# Patient Record
Sex: Male | Born: 1960 | Race: Black or African American | Hispanic: No | Marital: Single | Smoking: Current every day smoker
Health system: Southern US, Community
[De-identification: ages and names within clinical notes are randomized; demographics above are authoritative.]

## PROBLEM LIST (undated history)

## (undated) DIAGNOSIS — L409 Psoriasis, unspecified: Secondary | ICD-10-CM

## (undated) DIAGNOSIS — I10 Essential (primary) hypertension: Secondary | ICD-10-CM

## (undated) DIAGNOSIS — J45909 Unspecified asthma, uncomplicated: Secondary | ICD-10-CM

## (undated) DIAGNOSIS — M1990 Unspecified osteoarthritis, unspecified site: Secondary | ICD-10-CM

## (undated) DIAGNOSIS — M5416 Radiculopathy, lumbar region: Secondary | ICD-10-CM

## (undated) HISTORY — DX: Unspecified osteoarthritis, unspecified site: M19.90

## (undated) HISTORY — DX: Essential (primary) hypertension: I10

## (undated) HISTORY — DX: Radiculopathy, lumbar region: M54.16

## (undated) HISTORY — DX: Psoriasis, unspecified: L40.9

---

## 2007-12-25 DIAGNOSIS — L409 Psoriasis, unspecified: Secondary | ICD-10-CM

## 2007-12-25 DIAGNOSIS — M1990 Unspecified osteoarthritis, unspecified site: Secondary | ICD-10-CM

## 2007-12-25 HISTORY — DX: Unspecified osteoarthritis, unspecified site: M19.90

## 2007-12-25 HISTORY — DX: Psoriasis, unspecified: L40.9

## 2010-11-30 ENCOUNTER — Emergency Department (HOSPITAL_COMMUNITY): Admission: EM | Admit: 2010-11-30 | Discharge: 2010-07-30 | Payer: Self-pay | Admitting: Emergency Medicine

## 2012-12-24 DIAGNOSIS — I10 Essential (primary) hypertension: Secondary | ICD-10-CM

## 2012-12-24 HISTORY — DX: Essential (primary) hypertension: I10

## 2017-12-24 DIAGNOSIS — M5416 Radiculopathy, lumbar region: Secondary | ICD-10-CM

## 2017-12-24 HISTORY — DX: Radiculopathy, lumbar region: M54.16

## 2018-10-31 ENCOUNTER — Ambulatory Visit: Payer: Self-pay | Admitting: Internal Medicine

## 2018-10-31 ENCOUNTER — Encounter: Payer: Self-pay | Admitting: Internal Medicine

## 2018-10-31 VITALS — BP 152/102 | HR 68 | Resp 12 | Ht 69.0 in | Wt 155.0 lb

## 2018-10-31 DIAGNOSIS — M1990 Unspecified osteoarthritis, unspecified site: Secondary | ICD-10-CM

## 2018-10-31 DIAGNOSIS — I10 Essential (primary) hypertension: Secondary | ICD-10-CM

## 2018-10-31 DIAGNOSIS — J3089 Other allergic rhinitis: Secondary | ICD-10-CM

## 2018-10-31 DIAGNOSIS — L409 Psoriasis, unspecified: Secondary | ICD-10-CM

## 2018-10-31 DIAGNOSIS — M5416 Radiculopathy, lumbar region: Secondary | ICD-10-CM

## 2018-10-31 MED ORDER — MELOXICAM 15 MG PO TABS: 15.0000 mg | ORAL_TABLET | Freq: Every day | ORAL | 4 refills | Status: DC

## 2018-10-31 MED ORDER — AMLODIPINE BESYLATE 10 MG PO TABS: 10.0000 mg | ORAL_TABLET | Freq: Every day | ORAL | 11 refills | Status: DC

## 2018-10-31 MED ORDER — MOMETASONE FUROATE 50 MCG/ACT NA SUSP
NASAL | 12 refills | Status: DC
Start: 2018-10-31 — End: 2021-11-29

## 2018-10-31 MED ORDER — CETIRIZINE HCL 10 MG PO TABS: 10.0000 mg | ORAL_TABLET | Freq: Every day | ORAL | 11 refills | Status: DC

## 2018-10-31 MED ORDER — GABAPENTIN 100 MG PO CAPS
ORAL_CAPSULE | ORAL | 3 refills | Status: DC
Start: 2018-10-31 — End: 2021-10-22
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
## 2018-10-31 NOTE — Patient Instructions (Signed)
Gabapentin: Start taking Gabapentin 100 mg cap 1 cap at bedtime. In 3 days, increase to 2 caps at bedtime. In another 3 days, increase to 3 caps at bedtime You should be taking 3 caps at bedtime at this point.  In 3 days, start another 1 cap in the morning In 3 more days, increase to 2 caps in the morning In 3 more days, increase to 3 caps in the morning:  You should be taking 3 caps in the morning and 3 caps at bedtime at this point.  In 3 days, start another dose midday--1 cap In 3 more days, increase to 2 caps midday In 3 more days, increase to 3 caps midday. You should be taking 3 caps 3 times daily at this point.  Stay on this dose until follow up If you do not tolerate increasing the dose at any point, hold on the dose you tolerate or call clinic if having problems 

## 2018-11-07 ENCOUNTER — Other Ambulatory Visit (INDEPENDENT_AMBULATORY_CARE_PROVIDER_SITE_OTHER): Payer: Self-pay

## 2018-11-07 VITALS — BP 142/90 | HR 70

## 2018-11-07 DIAGNOSIS — I10 Essential (primary) hypertension: Secondary | ICD-10-CM

## 2018-11-07 DIAGNOSIS — M1990 Unspecified osteoarthritis, unspecified site: Secondary | ICD-10-CM

## 2018-11-07 DIAGNOSIS — Z79899 Other long term (current) drug therapy: Secondary | ICD-10-CM

## 2018-11-07 DIAGNOSIS — Z13220 Encounter for screening for lipoid disorders: Secondary | ICD-10-CM

## 2018-11-07 NOTE — Progress Notes (Signed)
Patient BP still elevated but better than before. Patient did not take medication this morning due to him having to fast. Per Dr. Delrae AlfredMulberry have patient come back in 2 weeks for repeat BP check and make sure he takes his medication that morning. Patient verbalized understanding

## 2018-11-08 LAB — CBC WITH DIFFERENTIAL/PLATELET
Basophils Absolute: 0.1 10*3/uL (ref 0.0–0.2)
Basos: 1 %
EOS (ABSOLUTE): 0.1 10*3/uL (ref 0.0–0.4)
EOS: 2 %
HEMATOCRIT: 41.5 % (ref 37.5–51.0)
Hemoglobin: 13.9 g/dL (ref 13.0–17.7)
IMMATURE GRANULOCYTES: 1 %
Immature Grans (Abs): 0 10*3/uL (ref 0.0–0.1)
LYMPHS ABS: 1.8 10*3/uL (ref 0.7–3.1)
Lymphs: 25 %
MCH: 30.5 pg (ref 26.6–33.0)
MCHC: 33.5 g/dL (ref 31.5–35.7)
MCV: 91 fL (ref 79–97)
MONOS ABS: 0.7 10*3/uL (ref 0.1–0.9)
Monocytes: 10 %
NEUTROS PCT: 61 %
Neutrophils Absolute: 4.6 10*3/uL (ref 1.4–7.0)
PLATELETS: 383 10*3/uL (ref 150–450)
RBC: 4.55 x10E6/uL (ref 4.14–5.80)
RDW: 13 % (ref 12.3–15.4)
WBC: 7.3 10*3/uL (ref 3.4–10.8)

## 2018-11-08 LAB — COMPREHENSIVE METABOLIC PANEL
A/G RATIO: 1.9 (ref 1.2–2.2)
ALBUMIN: 4.3 g/dL (ref 3.5–5.5)
ALK PHOS: 65 IU/L (ref 39–117)
ALT: 34 IU/L (ref 0–44)
AST: 28 IU/L (ref 0–40)
BUN / CREAT RATIO: 20 (ref 9–20)
BUN: 22 mg/dL (ref 6–24)
CO2: 25 mmol/L (ref 20–29)
Calcium: 9.4 mg/dL (ref 8.7–10.2)
Chloride: 104 mmol/L (ref 96–106)
Creatinine, Ser: 1.08 mg/dL (ref 0.76–1.27)
GFR calc Af Amer: 87 mL/min/{1.73_m2} (ref >59)
GFR calc non Af Amer: 75 mL/min/{1.73_m2} (ref >59)
GLOBULIN, TOTAL: 2.3 g/dL (ref 1.5–4.5)
GLUCOSE: 78 mg/dL (ref 65–99)
POTASSIUM: 5.3 mmol/L — AB (ref 3.5–5.2)
SODIUM: 143 mmol/L (ref 134–144)
Total Protein: 6.6 g/dL (ref 6.0–8.5)

## 2018-11-08 LAB — LIPID PANEL W/O CHOL/HDL RATIO
Cholesterol, Total: 174 mg/dL (ref 100–199)
HDL: 91 mg/dL (ref >39)
LDL Calculated: 73 mg/dL (ref 0–99)
Triglycerides: 49 mg/dL (ref 0–149)
VLDL CHOLESTEROL CAL: 10 mg/dL (ref 5–40)

## 2018-11-08 LAB — SEDIMENTATION RATE: Sed Rate: 5 mm/hr (ref 0–30)

## 2019-02-02 ENCOUNTER — Ambulatory Visit: Payer: Self-pay | Admitting: Internal Medicine

## 2019-03-06 ENCOUNTER — Ambulatory Visit: Payer: Self-pay | Admitting: Internal Medicine

## 2019-03-27 ENCOUNTER — Ambulatory Visit: Payer: Self-pay | Admitting: Internal Medicine

## 2019-05-13 ENCOUNTER — Ambulatory Visit: Payer: Self-pay | Admitting: Internal Medicine

## 2019-05-20 ENCOUNTER — Encounter: Payer: Self-pay | Admitting: Internal Medicine

## 2019-05-22 ENCOUNTER — Ambulatory Visit: Payer: Self-pay | Admitting: Internal Medicine

## 2019-07-13 ENCOUNTER — Ambulatory Visit: Payer: Self-pay | Admitting: Internal Medicine

## 2019-07-14 ENCOUNTER — Ambulatory Visit: Payer: Self-pay | Admitting: Internal Medicine

## 2019-10-27 ENCOUNTER — Ambulatory Visit (INDEPENDENT_AMBULATORY_CARE_PROVIDER_SITE_OTHER): Payer: Medicaid Other

## 2019-10-27 ENCOUNTER — Ambulatory Visit (INDEPENDENT_AMBULATORY_CARE_PROVIDER_SITE_OTHER): Payer: Medicaid Other | Admitting: Orthopedic Surgery

## 2019-10-27 ENCOUNTER — Encounter: Payer: Self-pay | Admitting: Orthopedic Surgery

## 2019-10-27 VITALS — Ht 69.0 in | Wt 155.0 lb

## 2019-10-27 DIAGNOSIS — G8929 Other chronic pain: Secondary | ICD-10-CM

## 2019-10-27 DIAGNOSIS — M5441 Lumbago with sciatica, right side: Secondary | ICD-10-CM

## 2019-10-27 MED ORDER — PREDNISONE 10 MG PO TABS: 10.0000 mg | ORAL_TABLET | Freq: Every day | ORAL | 1 refills | Status: DC
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
## 2019-11-10 ENCOUNTER — Encounter: Payer: Self-pay | Admitting: Orthopedic Surgery

## 2019-11-10 ENCOUNTER — Ambulatory Visit (INDEPENDENT_AMBULATORY_CARE_PROVIDER_SITE_OTHER): Payer: Medicaid Other | Admitting: Orthopedic Surgery

## 2019-11-10 VITALS — Ht 69.0 in | Wt 155.0 lb

## 2019-11-10 DIAGNOSIS — M5416 Radiculopathy, lumbar region: Secondary | ICD-10-CM
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
## 2019-11-25 ENCOUNTER — Telehealth: Payer: Self-pay
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
## 2019-12-02 ENCOUNTER — Telehealth: Payer: Self-pay | Admitting: Radiology

## 2019-12-02 DIAGNOSIS — M5416 Radiculopathy, lumbar region: Secondary | ICD-10-CM

## 2019-12-02 NOTE — Telephone Encounter (Signed)
FYI  Patient walked in stating he is supposed to have MRI and injection with Dr. Ernestina Patches and has not heard anything.  Per notes in chart, injection is to be scheduled post MRI.  MRI has been denied by insurance, and Randy Collins recommended 6 weeks of PT and then resubmit.  Patient would like to proceed with PT here.  I have entered referral. Patient aware someone from PT will call to schedule.

## 2019-12-02 NOTE — Telephone Encounter (Signed)
Thank you, noted.

## 2020-01-05 ENCOUNTER — Encounter: Payer: Self-pay | Admitting: Physical Therapy

## 2020-01-05 ENCOUNTER — Ambulatory Visit: Payer: Medicaid Other | Attending: Physician Assistant | Admitting: Physical Therapy

## 2020-01-05 ENCOUNTER — Other Ambulatory Visit: Payer: Self-pay

## 2020-01-05 DIAGNOSIS — M5416 Radiculopathy, lumbar region: Secondary | ICD-10-CM | POA: Diagnosis not present

## 2020-01-05 NOTE — Patient Instructions (Signed)
Access Code: [email or web address]  URL: [email or web address]  Date: 01/05/2020  Prepared by: Karie Mainland   Exercises  Prone Press Up on Elbows - 10 reps - 2 sets - 10 hold - 2x daily - 7x weekly  Supine Piriformis Stretch with Foot on Ground - 3 reps - 1 sets - 30 hold - 2x daily - 7x weekly  Supine Lower Trunk Rotation - 10 reps - 2 sets - 10 hold - 2x daily - 7x weekly  Supine Hamstring Stretch with Strap - 5 reps - 1 sets - 30 hold - 2x daily - 7x weekly  Sidelying Hip Extension PROM with Caregiver - 3 reps - 1 sets - 30 hold - 1x daily - 7x weekly

## 2020-01-05 NOTE — Therapy (Signed)
[place], [place], [address] Phone: [phone]   Fax:  [phone]  Physical Therapy Evaluation  Patient Details  Name: Randy Collins MRN: [id number] Date of Birth: 29-Oct-1961 Referring Provider (PT): Dr. Meridee Score   Encounter Date: 01/05/2020  PT End of Session - 01/05/20 1534    Visit Number  1    Number of Visits  15    Date for PT Re-Evaluation  03/01/20    Authorization Type  MCD    PT Start Time  1045    PT Stop Time  1130    PT Time Calculation (min)  45 min    Activity Tolerance  Patient tolerated treatment well    Behavior During Therapy  [facility] for tasks assessed/performed       Past Medical History:  Diagnosis Date  . Arthritis 2009   likely psoriatic arthritis--inflammatory, but not symmetric, left hip and back, right elbow, both knees  . Hypertension 2014  . Psoriasis 2009  . Right lumbar radiculopathy 2019    History reviewed. No pertinent surgical history.  There were no vitals filed for this visit.   Subjective Assessment - 01/05/20 1054    Subjective  Pt has had chronic pain in back and Rt leg for > 1 yr. He did chiropractic care without benefit.  Some days he needs to use a walker to get up. He is planning to have spinal injections and would consider surgery if needed. It may take 20-40 min to get moving due to pain in AM,    Limitations  Lifting;Standing;Walking;House hold activities    Diagnostic tests  XR, MRI ordered    Currently in Pain?  Yes    Pain Score  8     Pain Location  Back    Pain Orientation  Right;Left;Lower    Pain Descriptors / Indicators  Tightness;Aching    Pain Type  Chronic pain    Pain Radiating Towards  Rt leg    Pain Onset  More than a month ago    Pain Frequency  Constant    Aggravating Factors   the more I do the more I hurt, walking    Pain Relieving Factors  sitting relieves it    Effect of Pain on Daily Activities  pain prevents him from  bending and picking up things    Multiple Pain Sites  No         [facility] PT Assessment - 01/05/20 0001      Assessment   Medical Diagnosis  Lumbar radiculopathy    Referring Provider (PT)  Dr. Meridee Score    Prior Therapy  No, did chiropractic       Precautions   Precautions  None      Restrictions   Weight Bearing Restrictions  No      Balance Screen   Has the patient fallen in the past 6 months  No      [place] residence    Living Arrangements  Non-relatives/Friends    Type of [place] to enter    Entrance Stairs-Number of Steps  5    Entrance Stairs-Rails  Can reach both    [place] - 2 wheels;[place] - single point      Prior Function   Vocation  Full time employment   recently quit    Scientist, clinical (histocompatibility and immunogenetics) at  [employer]     Leisure  likes to cycle       Cognition   Overall Cognitive Status  Within Functional Limits for tasks assessed      Observation/Other Assessments   Focus on Therapeutic Outcomes (FOTO)   NT MCD       Sensation   Light Touch  Appears Intact      Coordination   Gross Motor Movements are Fluid and Coordinated  Not tested      Posture/Postural Control   Posture/Postural Control  Postural limitations    Postural Limitations  Rounded Shoulders;Forward head      AROM   Lumbar Flexion  25%   pain    Lumbar Extension  25%   no pain    Lumbar - Right Side Bend  stiff   pain on R    Lumbar - Left Side Bend  stiff   pain on R    Lumbar - Right Rotation  25%    Lumbar - Left Rotation  25%      PROM   Overall PROM Comments  no pain with hip ROM       Strength   Strength Assessment Site  --   knees and ankles WFL 5/5    Right Hip Flexion  4-/5    Right Hip Extension  4/5    Right Hip ABduction  4/5    Left Hip Flexion  4/5    Left Hip Extension  4/5    Left Hip ABduction  4/5      Palpation   Palpation comment  pain L4-L5 , central and to R of spinous  process, pain in lumbar paraspinals on Rt side       Special Tests    Special Tests  Lumbar    Lumbar Tests  Prone Knee Bend Test;Straight Leg Raise      Prone Knee Bend Test   Findings  Positive    Side  Right      Straight Leg Raise   Findings  Negative    Comment  bilateral                 Objective measurements completed on examination: See above findings.         PT Education - 01/05/20 1533    Education Details  PT/POC, HEP and spinal preference    Person(s) Educated  Patient    Methods  Explanation;Demonstration    Comprehension  Verbalized understanding;Returned demonstration       PT Short Term Goals - 01/05/20 1535      PT SHORT TERM GOAL #1   Title  Pt will be able to report leg pain improved by 25% with functional activity    Baseline  pain severe    Time  3    Period  Weeks    Status  New    Target Date  01/26/20      PT SHORT TERM GOAL #2   Title  Pt will be able to show Independence with initial to centralize symptoms and relieve pain in LE.    Baseline  given on eval    Time  3    Period  Weeks    Status  New    Target Date  01/26/20        PT Long Term Goals - 01/05/20 1540      PT LONG TERM GOAL #1   Title  Pt will be able to wake in the morning  with pain no more than 5/10    Baseline  Pain is severe, at times he needs a walker    Time  8    Period  Weeks    Status  New    Target Date  03/01/20      PT LONG TERM GOAL #2   Title  Pt will be able to stand, walk in the community for an hour with pain no more than 5/10.    Baseline  20-40 min depending on the day    Time  8    Period  Weeks    Status  New    Target Date  03/01/20      PT LONG TERM GOAL #3   Title  Pt will be able to lift, carry 15-25 lbs with good body mechanics to prevent future injury.    Baseline  pain with any amount of lifting    Time  8    Period  Weeks    Status  New    Target Date  03/01/20             Plan - 01/05/20 1548     Clinical Impression Statement  Pt presents for mod complexity eval of lumbar radiculopathy that has progressed to becoming more severe in nature, more disabling to him.  He has been unbale to continue working due to severity of symptoms.  He shows no weakness, sensation is intact.  Pain is low kunbar and into Rt hip, thigh and lower leg.  Extension centralizes symptoms.  He has been overstretching into lumbar flexion and doing crunches, so I suggest he stop that for now until we can ensure he can continue exercise without provoking symptoms.    Personal Factors and Comorbidities  Behavior Pattern;Comorbidity 1;Past/Current Experience;Time since onset of injury/illness/exacerbation    Examination-Activity Limitations  Lift;Stand;Locomotion Level;Bend;Carry;Squat;Transfers    Examination-Participation Restrictions  Community Activity;Interpersonal Relationship;Other    Stability/Clinical Decision Making  Evolving/Moderate complexity    Clinical Decision Making  Low    Rehab Potential  Excellent    PT Frequency  2x / week    PT Duration  8 weeks    PT Treatment/Interventions  ADLs/Self Care Home Management;Electrical Stimulation;Therapeutic exercise;Moist Heat;Traction;Ultrasound;Therapeutic activities;Patient/family education;Manual techniques;Dry needling;Functional mobility training;Neuromuscular re-education;Passive range of motion;Cryotherapy;Spinal Manipulations    PT Next Visit Plan  check HEP , modalities for pain, begin posture and lifting, stabilization in extension    PT Home Exercise Plan  POE, LTR, piriformis, sidelying for Rt lateral trunk stretch/traction, hamstring    Consulted and Agree with Plan of Care  Patient       Patient will benefit from skilled therapeutic intervention in order to improve the following deficits and impairments:  Increased fascial restricitons, Impaired sensation, Improper body mechanics, Pain, Postural dysfunction, Decreased mobility, Decreased range of motion,  Decreased strength, Difficulty walking, Impaired flexibility, Hypomobility  Visit Diagnosis: Radiculopathy, lumbar region     Problem List Patient Active Problem List   Diagnosis Date Noted  . Psoriasis   . Right lumbar radiculopathy 12/24/2017  . Hypertension 12/24/2012  . Arthritis 12/25/2007    Nachmen Mansel 01/05/2020, 4:09 PM  [facility] [address] [place], [place], [address] Phone: [phone]   Fax:  [phone]  Name: SARAH ZERBY MRN: [id number] Date of Birth: 01/22/1961   Karie Mainland, PT 01/05/20 4:09 PM Phone: [phone] Fax: [phone]

## 2020-01-13 ENCOUNTER — Ambulatory Visit: Payer: Medicaid Other | Admitting: Physical Therapy

## 2020-01-14 ENCOUNTER — Other Ambulatory Visit: Payer: Self-pay

## 2020-01-14 ENCOUNTER — Ambulatory Visit: Payer: Medicaid Other | Admitting: Physical Therapy

## 2020-01-14 DIAGNOSIS — M5416 Radiculopathy, lumbar region: Secondary | ICD-10-CM

## 2020-01-14 NOTE — Therapy (Signed)
[facility] [address] [place], [place], [address] Phone: [phone]   Fax:  [phone]  Physical Therapy Treatment  Patient Details  Name: Randy Collins MRN: [id number] Date of Birth: 12/19/1961 Referring Provider (PT): Dr. Aldean Baker   Encounter Date: 01/14/2020  PT End of Session - 01/14/20 1317    Visit Number  2    Number of Visits  15    Date for PT Re-Evaluation  03/01/20    Authorization Type  MCD approved 3 treatments    Authorization Time Period  01/11/20-01/24/20    Authorization - Visit Number  1    Authorization - Number of Visits  3    PT Start Time  0115    PT Stop Time  0155    PT Time Calculation (min)  40 min       Past Medical History:  Diagnosis Date  . Arthritis 2009   likely psoriatic arthritis--inflammatory, but not symmetric, left hip and back, right elbow, both knees  . Hypertension 2014  . Psoriasis 2009  . Right lumbar radiculopathy 2019    No past surgical history on file.  There were no vitals filed for this visit.  Subjective Assessment - 01/14/20 1318    Subjective  The back is better than yesterday. It stays aggravated.    Currently in Pain?  Yes    Pain Score  7     Pain Location  Back    Pain Orientation  Right;Left;Lower    Pain Descriptors / Indicators  Aching;Tightness    Pain Type  Chronic pain    Aggravating Factors   walking, activity    Pain Relieving Factors  sitting                       [facility] Adult PT Treatment/Exercise - 01/14/20 0001      Lumbar Exercises: Stretches   Single Knee to Chest Stretch  3 reps;30 seconds    Lower Trunk Rotation  10 seconds    Lower Trunk Rotation Limitations  10 reps     Press Ups  10 reps    Press Ups Limitations  5 sec     ITB Stretch  1 rep;30 seconds    Piriformis Stretch  3 reps;30 seconds    Piriformis Stretch Limitations  push and pull      Lumbar Exercises: Supine   Bridge  10 reps      Lumbar  Exercises: Prone   Single Arm Raise  10 reps    Single Arm Raises Limitations  10 reps each with abdominal draw in     Other Prone Lumbar Exercises  hip extension (with knee flexed) x 10 each with abdominal draw in                PT Short Term Goals - 01/05/20 1535      PT SHORT TERM GOAL #1   Title  Pt will be able to report leg pain improved by 25% with functional activity    Baseline  pain severe    Time  3    Period  Weeks    Status  New    Target Date  01/26/20      PT SHORT TERM GOAL #2   Title  Pt will be able to show Independence with initial to centralize symptoms and relieve pain in LE.    Baseline  given on eval    Time  3  Period  Weeks    Status  New    Target Date  01/26/20        PT Long Term Goals - 01/05/20 1540      PT LONG TERM GOAL #1   Title  Pt will be able to wake in the morning with pain no more than 5/10    Baseline  Pain is severe, at times he needs a walker    Time  8    Period  Weeks    Status  New    Target Date  03/01/20      PT LONG TERM GOAL #2   Title  Pt will be able to stand, walk in the community for an hour with pain no more than 5/10.    Baseline  20-40 min depending on the day    Time  8    Period  Weeks    Status  New    Target Date  03/01/20      PT LONG TERM GOAL #3   Title  Pt will be able to lift, carry 15-25 lbs with good body mechanics to prevent future injury.    Baseline  pain with any amount of lifting    Time  8    Period  Weeks    Status  New    Target Date  03/01/20            Plan - 01/14/20 1357    Clinical Impression Statement  pt reports HEP provides some relief. Extension decreases leg pain in standing and prone. Began prone stabilization and updated HEP. He reported less pain at end of session.    PT Next Visit Plan  check HEP , modalities for pain, begin posture and lifting, stabilization in extension    PT Home Exercise Plan  POE, LTR, piriformis, sidelying for Rt lateral trunk  stretch/traction, hamstring       Patient will benefit from skilled therapeutic intervention in order to improve the following deficits and impairments:  Increased fascial restricitons, Impaired sensation, Improper body mechanics, Pain, Postural dysfunction, Decreased mobility, Decreased range of motion, Decreased strength, Difficulty walking, Impaired flexibility, Hypomobility  Visit Diagnosis: Radiculopathy, lumbar region     Problem List Patient Active Problem List   Diagnosis Date Noted  . Psoriasis   . Right lumbar radiculopathy 12/24/2017  . Hypertension 12/24/2012  . Arthritis 12/25/2007    Dorene Ar, PTA 01/14/2020, 2:00 PM  [facility] [address] [place], [place], [address] Phone: [phone]   Fax:  [phone]  Name: Randy Collins MRN: [id number] Date of Birth: 20-Nov-1961

## 2020-01-19 ENCOUNTER — Ambulatory Visit: Payer: Medicaid Other | Admitting: Physical Therapy

## 2020-01-21 ENCOUNTER — Ambulatory Visit: Payer: Medicaid Other | Admitting: Physical Therapy

## 2020-01-26 ENCOUNTER — Ambulatory Visit: Payer: Medicaid Other | Admitting: Physical Therapy

## 2020-01-28 ENCOUNTER — Ambulatory Visit: Payer: Medicaid Other | Attending: Physician Assistant | Admitting: Physical Therapy

## 2020-01-28 ENCOUNTER — Encounter: Payer: Self-pay | Admitting: Physical Therapy

## 2020-01-28 ENCOUNTER — Ambulatory Visit: Payer: Medicaid Other | Admitting: Physical Therapy

## 2020-01-28 ENCOUNTER — Other Ambulatory Visit: Payer: Self-pay

## 2020-01-28 DIAGNOSIS — M5416 Radiculopathy, lumbar region: Secondary | ICD-10-CM

## 2020-01-28 NOTE — Therapy (Signed)
[place], [place], [address] Phone: [phone]   Fax:  [phone]  Physical Therapy Treatment  Patient Details  Name: Randy Collins MRN: [id number] Date of Birth: 07-Dec-1961 Referring Provider (PT): Dr. Meridee Score   Encounter Date: 01/28/2020  PT End of Session - 01/28/20 1615    Visit Number  3    Number of Visits  15    Date for PT Re-Evaluation  03/01/20    Authorization Type  MCD approved 3 treatments    PT Start Time  1532    PT Stop Time  1620    PT Time Calculation (min)  48 min    Activity Tolerance  Patient tolerated treatment well    Behavior During Therapy  [facility] for tasks assessed/performed       Past Medical History:  Diagnosis Date  . Arthritis 2009   likely psoriatic arthritis--inflammatory, but not symmetric, left hip and back, right elbow, both knees  . Hypertension 2014  . Psoriasis 2009  . Right lumbar radiculopathy 2019    History reviewed. No pertinent surgical history.  There were no vitals filed for this visit.  Subjective Assessment - 01/28/20 1538    Subjective  Here for PT re-evaluation.  Patient continues to have significant pain in Rt side of low back and hip. Sensory disturbance. States he needs to come here to get to see his doctor (Ortho).    Limitations  Lifting;Standing;Walking;House hold activities    How long can you sit comfortably?  sits towards L side    How long can you stand comfortably?  1 hour , doesn't bother me much    Diagnostic tests  XR, MRI ordered    Currently in Pain?  Yes    Pain Score  8     Pain Location  Back    Pain Orientation  Right    Pain Descriptors / Indicators  Aching;Sore    Pain Type  Chronic pain    Pain Onset  More than a month ago    Pain Frequency  Constant    Aggravating Factors   walking, standing    Pain Relieving Factors  sitting, rest         [facility] PT Assessment - 01/28/20 0001      Strength   Right Hip  Extension  4/5    Left Hip Extension  5/5      Palpation   Palpation comment  spasm R quadratus lumborum       Special Tests    Special Tests  Lumbar    Lumbar Tests  Prone Knee Bend Test;Straight Leg Raise      Prone Knee Bend Test   Findings  Positive    Side  Right         [facility] Adult PT Treatment/Exercise - 01/28/20 0001      Lumbar Exercises: Stretches   Active Hamstring Stretch  2 reps;30 seconds    Single Knee to Chest Stretch  3 reps;30 seconds    Lower Trunk Rotation  10 seconds    Lower Trunk Rotation Limitations  10 reps , knees crossed       Lumbar Exercises: Supine   Bridge  10 reps    Bridge Limitations  PPT first       Lumbar Exercises: Prone   Other Prone Lumbar Exercises  childs pose forward and lateral x 30 sec       Moist Heat Therapy  Number Minutes Moist Heat  15 Minutes    Moist Heat Location  Lumbar Spine      Electrical Stimulation   Electrical Stimulation Location  lumbar bilateral     Electrical Stimulation Action  IFC    Electrical Stimulation Parameters  29    Electrical Stimulation Goals  Pain      Manual Therapy   Manual therapy comments  brief soft tissue work to Rt quadratus lumbroum in sidelying                PT Short Term Goals - 01/28/20 1554      PT SHORT TERM GOAL #1   Title  Pt will be able to report leg pain improved by 25% with functional activity    Baseline  stops more in Rt ant, lateral hip    Status  Achieved      PT SHORT TERM GOAL #2   Title  Pt will be able to show Independence with initial to centralize symptoms and relieve pain in LE.    Baseline  needs cues for intial HEP    Status  On-going        PT Long Term Goals - 01/28/20 1555      PT LONG TERM GOAL #1   Title  Pt will be able to wake in the morning with pain no more than 5/10    Baseline  has not needed a walker since starting PT, can be 0-10 to 6/10    Status  On-going      PT LONG TERM GOAL #2   Title  Pt will be able to stand,  walk in the community for an hour with pain no more than 5/10.    Baseline  20-40 min depending on the day    Status  On-going      PT LONG TERM GOAL #3   Title  Pt will be able to lift, carry 15-25 lbs with good body mechanics to prevent future injury.    Baseline  it always hurts when he lifts    Status  On-going            Plan - 01/28/20 1547    Clinical Impression Statement  Mentioned that he fell on a table from a few feet high a few years ago (late 40's).  Patient has missed visits due to appt conflicts.  He has been able to report a decrease in radicular symptoms.  He has new job which involve stocking shelves and so lifting technique is paramount.  He did find relief with exercises and IFC today.  WIll resubmit to MCD with reissue of attendance policy.    Personal Factors and Comorbidities  Behavior Pattern;Comorbidity 1;Past/Current Experience;Time since onset of injury/illness/exacerbation    Examination-Activity Limitations  Lift;Stand;Locomotion Level;Bend;Carry;Squat;Transfers    Examination-Participation Restrictions  Community Activity;Interpersonal Relationship;Other    Stability/Clinical Decision Making  Evolving/Moderate complexity    Rehab Potential  Excellent    PT Frequency  2x / week    PT Duration  6 weeks    PT Treatment/Interventions  ADLs/Self Care Home Management;Electrical Stimulation;Therapeutic exercise;Moist Heat;Traction;Ultrasound;Therapeutic activities;Patient/family education;Manual techniques;Dry needling;Functional mobility training;Neuromuscular re-education;Passive range of motion;Cryotherapy;Spinal Manipulations    PT Next Visit Plan  modalities for pain, begin posture and lifting, stabilization in extension. POC 3/9    PT Home Exercise Plan  POE, LTR, piriformis, sidelying for Rt lateral trunk stretch/traction, hamstring    Consulted and Agree with Plan of Care  Patient  Patient will benefit from skilled therapeutic intervention in order  to improve the following deficits and impairments:  Increased fascial restricitons, Impaired sensation, Improper body mechanics, Pain, Postural dysfunction, Decreased mobility, Decreased range of motion, Decreased strength, Difficulty walking, Impaired flexibility, Hypomobility  Visit Diagnosis: Radiculopathy, lumbar region     Problem List Patient Active Problem List   Diagnosis Date Noted  . Psoriasis   . Right lumbar radiculopathy 12/24/2017  . Hypertension 12/24/2012  . Arthritis 12/25/2007    Nell Schrack 01/28/2020, 4:17 PM  [facility] [address] [place], [place], [address] Phone: [phone]   Fax:  [phone]  Name: Randy Collins MRN: [id number] Date of Birth: 1961-01-17   Karie Mainland, PT 01/28/20 4:17 PM Phone: [phone] Fax: [phone]

## 2020-02-02 ENCOUNTER — Other Ambulatory Visit: Payer: Self-pay

## 2020-02-02 ENCOUNTER — Ambulatory Visit: Payer: Medicaid Other | Admitting: Physical Therapy

## 2020-02-02 ENCOUNTER — Encounter: Payer: Self-pay | Admitting: Physical Therapy

## 2020-02-02 DIAGNOSIS — M5416 Radiculopathy, lumbar region: Secondary | ICD-10-CM

## 2020-02-02 NOTE — Therapy (Signed)
[facility] [address] [place], [place], [address] Phone: [phone]   Fax:  [phone]  Physical Therapy Treatment  Patient Details  Name: Randy Collins MRN: [id number] Date of Birth: [birth year] Referring Provider (PT): Dr. Aldean Baker   Encounter Date: 02/02/2020  PT End of Session - 02/02/20 0924    Visit Number  4    Number of Visits  15    Date for PT Re-Evaluation  03/01/20    Authorization Type  MCD approved 3 treatments    Authorization Time Period  2/9-3/22    Authorization - Visit Number  1    Authorization - Number of Visits  12    PT Start Time  0917    PT Stop Time  1015    PT Time Calculation (min)  58 min    Activity Tolerance  Patient tolerated treatment well    Behavior During Therapy  [place] for tasks assessed/performed       Past Medical History:  Diagnosis Date  . Arthritis 2009   likely psoriatic arthritis--inflammatory, but not symmetric, left hip and back, right elbow, both knees  . Hypertension 2014  . Psoriasis 2009  . Right lumbar radiculopathy 2019    History reviewed. No pertinent surgical history.  There were no vitals filed for this visit.  Subjective Assessment - 02/02/20 0920    Subjective  Pt with significant pain this AM. but its easing off.  If I stretch and move it gets better.  Ive learned to maintain.  I would like to be able to get up with min pain and then i would know I was ok.    Currently in Pain?  Yes    Pain Score  8     Pain Location  Back    Pain Orientation  Right    Pain Descriptors / Indicators  Sore    Pain Type  Chronic pain    Pain Onset  More than a month ago    Pain Frequency  Constant    Aggravating Factors   walking, AM, standing    Pain Relieving Factors  sit, rest, stretching         [facility] Adult PT Treatment/Exercise - 02/02/20 0001      Lumbar Exercises: Stretches   Active Hamstring Stretch  2 reps;30 seconds    Lower Trunk Rotation  10  seconds    Lower Trunk Rotation Limitations  10 reps , knees crossed     Piriformis Stretch  2 reps;30 seconds    Other Lumbar Stretch Exercise  upper trunk rotation x 5 each side     Other Lumbar Stretch Exercise  sidelying QL on mat x 1 min       Lumbar Exercises: Aerobic   Nustep  6 min L5       Lumbar Exercises: Standing   Functional Squats  10 reps    Functional Squats Limitations  counter top     Lifting Limitations  deadlift green band looped around feet x 10 min cues     Wall Slides  15 reps    Shoulder Adduction Limitations  horizontal abd on wall green band x 15 in mini squat       Lumbar Exercises: Supine   Bridge  10 reps      Moist Heat Therapy   Number Minutes Moist Heat  15 Minutes    Moist Heat Location  Lumbar Spine      Electrical Stimulation  Electrical Stimulation Location  lumbar bilateral     Electrical Stimulation Action  IFC     Electrical Stimulation Parameters  34    Electrical Stimulation Goals  Pain               PT Short Term Goals - 02/02/20 0929      PT SHORT TERM GOAL #1   Title  Pt will be able to report leg pain improved by 25% with functional activity    Status  Achieved      PT SHORT TERM GOAL #2   Title  Pt will be able to show Independence with initial to centralize symptoms and relieve pain in LE.    Status  On-going        PT Long Term Goals - 01/28/20 1555      PT LONG TERM GOAL #1   Title  Pt will be able to wake in the morning with pain no more than 5/10    Baseline  has not needed a walker since starting PT, can be 0-10 to 6/10    Status  On-going      PT LONG TERM GOAL #2   Title  Pt will be able to stand, walk in the community for an hour with pain no more than 5/10.    Baseline  20-40 min depending on the day    Status  On-going      PT LONG TERM GOAL #3   Title  Pt will be able to lift, carry 15-25 lbs with good body mechanics to prevent future injury.    Baseline  it always hurts when he lifts     Status  On-going            Plan - 02/02/20 0943    Clinical Impression Statement  Pt performs exercises without apparent pain increase. He has near perfect form with squats and dead lifting (no wgt) Requested IFC for pain relief, improved to 6/10 prior to IFC with simple stretches.  COnt POC. Marland Kitchen    PT Treatment/Interventions  ADLs/Self Care Home Management;Electrical Stimulation;Therapeutic exercise;Moist Heat;Traction;Ultrasound;Therapeutic activities;Patient/family education;Manual techniques;Dry needling;Functional mobility training;Neuromuscular re-education;Passive range of motion;Cryotherapy;Spinal Manipulations    PT Next Visit Plan  modalities for pain, begin posture and lifting, stabilization in extension. POC 3/9    PT Home Exercise Plan  POE, LTR, piriformis, sidelying for Rt lateral trunk stretch/traction, hamstring    Consulted and Agree with Plan of Care  Patient       Patient will benefit from skilled therapeutic intervention in order to improve the following deficits and impairments:  Increased fascial restricitons, Impaired sensation, Improper body mechanics, Pain, Postural dysfunction, Decreased mobility, Decreased range of motion, Decreased strength, Difficulty walking, Impaired flexibility, Hypomobility  Visit Diagnosis: Radiculopathy, lumbar region     Problem List Patient Active Problem List   Diagnosis Date Noted  . Psoriasis   . Right lumbar radiculopathy 12/24/2017  . Hypertension 12/24/2012  . Arthritis 12/25/2007    Makenzie Vittorio 02/02/2020, 12:23 PM  [facility] [address] [place], [place], [address] Phone: [phone]   Fax:  [phone]  Name: DELWYN SCOGGIN MRN: [id number] Date of Birth: January 29, 1961  Raeford Razor, PT 02/02/20 12:23 PM Phone: [phone] Fax: [phone]

## 2020-02-09 ENCOUNTER — Ambulatory Visit: Payer: Medicaid Other | Admitting: Physical Therapy

## 2020-02-09 ENCOUNTER — Encounter: Payer: Self-pay | Admitting: Physical Therapy

## 2020-02-09 ENCOUNTER — Other Ambulatory Visit: Payer: Self-pay

## 2020-02-09 DIAGNOSIS — M5416 Radiculopathy, lumbar region: Secondary | ICD-10-CM

## 2020-02-09 NOTE — Therapy (Signed)
[place], [place], [address] Phone: [phone]   Fax:  [phone]  Physical Therapy Treatment  Patient Details  Name: Randy Collins MRN: [id number] Date of Birth: [birth year] Referring Provider (PT): Dr. Meridee Score   Encounter Date: 02/09/2020  PT End of Session - 02/09/20 0950    Visit Number  5    Number of Visits  15    Date for PT Re-Evaluation  03/01/20    Authorization Type  MCD approved 3 treatments    Authorization Time Period  2/9-3/22    Authorization - Visit Number  2    Authorization - Number of Visits  12    PT Start Time  0948   13 minutes late   PT Stop Time  1015    PT Time Calculation (min)  27 min       Past Medical History:  Diagnosis Date  . Arthritis 2009   likely psoriatic arthritis--inflammatory, but not symmetric, left hip and back, right elbow, both knees  . Hypertension 2014  . Psoriasis 2009  . Right lumbar radiculopathy 2019    History reviewed. No pertinent surgical history.  There were no vitals filed for this visit.  Subjective Assessment - 02/09/20 0948    Subjective  5/10 not as bad.    Currently in Pain?  No/denies                       [facility] Adult PT Treatment/Exercise - 02/09/20 0001      Self-Care   Self-Care  Other Self-Care Comments    Other Self-Care Comments   Pt brought home TENS, reviewed how to operate and place pads       Lumbar Exercises: Stretches   Active Hamstring Stretch  2 reps;30 seconds    Lower Trunk Rotation  10 seconds    Lower Trunk Rotation Limitations  10 reps , knees crossed     Piriformis Stretch  2 reps;30 seconds      Lumbar Exercises: Aerobic   Nustep  L4 x 5 minutes       Lumbar Exercises: Standing   Functional Squats  15 reps    Functional Squats Limitations  facing sink     Wall Slides  15 reps    Wall Slides Limitations  with horizontal abduction and green band       Lumbar Exercises:  Supine   Bridge  15 reps               PT Short Term Goals - 02/02/20 0929      PT SHORT TERM GOAL #1   Title  Pt will be able to report leg pain improved by 25% with functional activity    Status  Achieved      PT SHORT TERM GOAL #2   Title  Pt will be able to show Independence with initial to centralize symptoms and relieve pain in LE.    Status  On-going        PT Long Term Goals - 01/28/20 1555      PT LONG TERM GOAL #1   Title  Pt will be able to wake in the morning with pain no more than 5/10    Baseline  has not needed a walker since starting PT, can be 0-10 to 6/10    Status  On-going      PT LONG TERM GOAL #2   Title  Pt will  be able to stand, walk in the community for an hour with pain no more than 5/10.    Baseline  20-40 min depending on the day    Status  On-going      PT LONG TERM GOAL #3   Title  Pt will be able to lift, carry 15-25 lbs with good body mechanics to prevent future injury.    Baseline  it always hurts when he lifts    Status  On-going            Plan - 02/09/20 8882    Clinical Impression Statement  pt reports contiued pain in mornings.Sometimes pain does not travel all the way down LE which is a good day. He brought home TENS today for instruction on how to use. Time spent with explination and application of HOME Tens.    PT Next Visit Plan  modalities for pain, begin posture and lifting, stabilization in extension. POC 3/9    PT Home Exercise Plan  POE, LTR, piriformis, sidelying for Rt lateral trunk stretch/traction, hamstring       Patient will benefit from skilled therapeutic intervention in order to improve the following deficits and impairments:  Increased fascial restricitons, Impaired sensation, Improper body mechanics, Pain, Postural dysfunction, Decreased mobility, Decreased range of motion, Decreased strength, Difficulty walking, Impaired flexibility, Hypomobility  Visit Diagnosis: Radiculopathy, lumbar  region     Problem List Patient Active Problem List   Diagnosis Date Noted  . Psoriasis   . Right lumbar radiculopathy 12/24/2017  . Hypertension 12/24/2012  . Arthritis 12/25/2007    Sherrie Mustache, PTA 02/09/2020, 10:17 AM  [facility] [address] [place], [place], [address] Phone: [phone]   Fax:  [phone]  Name: Randy Collins MRN: [id number] Date of Birth: Jan 16, 1961

## 2020-02-12 ENCOUNTER — Ambulatory Visit: Payer: Medicaid Other | Admitting: Physical Therapy

## 2020-02-16 ENCOUNTER — Other Ambulatory Visit: Payer: Self-pay

## 2020-02-16 ENCOUNTER — Ambulatory Visit: Payer: Medicaid Other | Admitting: Physical Therapy

## 2020-02-16 ENCOUNTER — Encounter: Payer: Self-pay | Admitting: Physical Therapy

## 2020-02-16 DIAGNOSIS — M5416 Radiculopathy, lumbar region: Secondary | ICD-10-CM

## 2020-02-16 NOTE — Therapy (Signed)
[facility] [address] [place], [place], [address] Phone: [phone]   Fax:  [phone]  Physical Therapy Treatment  Patient Details  Name: Randy Collins MRN: [id number] Date of Birth: 05-13-1961 Referring Provider (PT): Dr. Aldean Baker   Encounter Date: 02/16/2020  PT End of Session - 02/16/20 1021    Visit Number  6    Number of Visits  15    Date for PT Re-Evaluation  03/01/20    Authorization Type  MCD approved 3 treatments    Authorization Time Period  2/9-3/22    Authorization - Visit Number  3    Authorization - Number of Visits  12    PT Start Time  1004    PT Stop Time  1045    PT Time Calculation (min)  41 min    Activity Tolerance  Patient tolerated treatment well    Behavior During Therapy  [facility] for tasks assessed/performed       Past Medical History:  Diagnosis Date  . Arthritis 2009   likely psoriatic arthritis--inflammatory, but not symmetric, left hip and back, right elbow, both knees  . Hypertension 2014  . Psoriasis 2009  . Right lumbar radiculopathy 2019    History reviewed. No pertinent surgical history.  There were no vitals filed for this visit.  Subjective Assessment - 02/16/20 1009    Subjective  Today is a great day.  The worst thing about my back is the morning time.  I try to use my TENS unit, do my stretches.    Currently in Pain?  Yes    Pain Score  3     Pain Location  Back    Pain Orientation  Right    Pain Descriptors / Indicators  Dull;Numbness    Pain Type  Chronic pain    Pain Onset  More than a month ago    Pain Frequency  Intermittent    Aggravating Factors   walking, AM , standing    Pain Relieving Factors  sit , rest, stretch          [facility] Adult PT Treatment/Exercise - 02/16/20 0001      Lumbar Exercises: Stretches   Active Hamstring Stretch  2 reps;30 seconds    Lower Trunk Rotation  10 seconds    Lower Trunk Rotation Limitations  10 reps , knees crossed      Hip Flexor Stretch  2 reps;30 seconds    Hip Flexor Stretch Limitations  off table     Pelvic Tilt  10 reps    ITB Stretch  2 reps;20 seconds    Piriformis Stretch  2 reps;30 seconds      Lumbar Exercises: Aerobic   Nustep  8 min L 7       Lumbar Exercises: Supine   Bridge  15 reps    Bridge Limitations  with PPT              PT Education - 02/16/20 1347    Education Details  hip flexor stretching, POC    Person(s) Educated  Patient    Methods  Explanation;Demonstration    Comprehension  Verbalized understanding       PT Short Term Goals - 02/16/20 1035      PT SHORT TERM GOAL #1   Title  Pt will be able to report leg pain improved by 25% with functional activity    Status  Achieved      PT SHORT TERM  GOAL #2   Title  Pt will be able to show Independence with initial to centralize symptoms and relieve pain in LE.    Status  On-going        PT Long Term Goals - 02/16/20 1036      PT LONG TERM GOAL #1   Title  Pt will be able to wake in the morning with pain no more than 5/10    Status  Achieved      PT LONG TERM GOAL #2   Title  Pt will be able to stand, walk in the community for an hour with pain no more than 5/10.    Baseline  today he can do this    Status  On-going      PT LONG TERM GOAL #3   Title  Pt will be able to lift, carry 15-25 lbs with good body mechanics to prevent future injury.    Baseline  has not done this    Status  On-going            Plan - 02/16/20 1040    Clinical Impression Statement  Patient is doing well today, able to do exercises without cues.  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    PT Treatment/Interventions  ADLs/Self Care Home Management;Electrical Stimulation;Therapeutic exercise;Moist Heat;Traction;Ultrasound;Therapeutic activities;Patient/family education;Manual techniques;Dry needling;Functional mobility  training;Neuromuscular re-education;Passive range of motion;Cryotherapy;Spinal Manipulations    PT Next Visit Plan  modalities for pain, begin posture and lifting, stabilization in extension. POC 3/9    PT Home Exercise Plan  POE, LTR, piriformis, sidelying for Rt lateral trunk stretch/traction, hamstring    Consulted and Agree with Plan of Care  Patient       Patient will benefit from skilled therapeutic intervention in order to improve the following deficits and impairments:  Increased fascial restricitons, Impaired sensation, Improper body mechanics, Pain, Postural dysfunction, Decreased mobility, Decreased range of motion, Decreased strength, Difficulty walking, Impaired flexibility, Hypomobility  Visit Diagnosis: Radiculopathy, lumbar region     Problem List Patient Active Problem List   Diagnosis Date Noted  . Psoriasis   . Right lumbar radiculopathy 12/24/2017  . Hypertension 12/24/2012  . Arthritis 12/25/2007    Nicoletta Hush 02/16/2020, 1:54 PM  [facility] [address] [place], [place], [address] Phone: [phone]   Fax:  [phone]  Name: Randy Collins MRN: [id number] Date of Birth: 03/02/1961  Raeford Razor, PT 02/16/20 1:54 PM Phone: [phone] Fax: [phone]

## 2020-02-18 ENCOUNTER — Other Ambulatory Visit: Payer: Self-pay

## 2020-02-18 ENCOUNTER — Ambulatory Visit: Payer: Medicaid Other | Admitting: Physical Therapy

## 2020-02-23 ENCOUNTER — Encounter: Payer: Self-pay | Admitting: Physical Therapy

## 2020-02-23 ENCOUNTER — Other Ambulatory Visit: Payer: Self-pay

## 2020-02-23 ENCOUNTER — Encounter: Payer: Self-pay | Admitting: Internal Medicine

## 2020-02-23 ENCOUNTER — Ambulatory Visit: Payer: Medicaid Other | Attending: Physician Assistant | Admitting: Physical Therapy

## 2020-02-23 DIAGNOSIS — M5416 Radiculopathy, lumbar region: Secondary | ICD-10-CM | POA: Diagnosis present

## 2020-02-23 NOTE — Therapy (Signed)
[place], [place], [address] Phone: [phone]   Fax:  [phone]  Physical Therapy Treatment  Patient Details  Name: Randy Collins MRN: [id number] Date of Birth: 05-05-1961 Referring Provider (PT): Dr. Meridee Score   Encounter Date: 02/23/2020  PT End of Session - 02/23/20 1215    Visit Number  7    Number of Visits  15    Date for PT Re-Evaluation  03/01/20    Authorization Type  MCD approved 3 treatments    Authorization - Visit Number  4    Authorization - Number of Visits  12    PT Start Time  1003    PT Stop Time  1055    PT Time Calculation (min)  52 min    Activity Tolerance  Patient tolerated treatment well    Behavior During Therapy  [facility] for tasks assessed/performed       Past Medical History:  Diagnosis Date  . Arthritis 2009   likely psoriatic arthritis--inflammatory, but not symmetric, left hip and back, right elbow, both knees  . Hypertension 2014  . Psoriasis 2009  . Right lumbar radiculopathy 2019    History reviewed. No pertinent surgical history.  There were no vitals filed for this visit.  Subjective Assessment - 02/23/20 1007    Subjective  No pain right now.  Walked 20 min this AM.  Not back to work yet.    Currently in Pain?  Yes    Pain Score  4     Pain Location  Back    Pain Orientation  Right    Pain Descriptors / Indicators  Aching    Pain Type  Chronic pain    Pain Onset  More than a month ago    Pain Frequency  Intermittent    Aggravating Factors   AM, walking, standing    Pain Relieving Factors  sit , stretch    Effect of Pain on Daily Activities  not working    Multiple Pain Sites  No         [facility] Adult PT Treatment/Exercise - 02/23/20 0001      Therapeutic Activites    Therapeutic Activities  Lifting    Lifting  28 lbs box lift to shoulder hgt. 25 lbs Kettlebell , squatr and dead lift    sled push 300 feet with changing direction     Lumbar  Exercises: Stretches   Active Hamstring Stretch  2 reps;30 seconds    Lower Trunk Rotation  10 seconds    Lower Trunk Rotation Limitations  x 10     ITB Stretch  2 reps;20 seconds    Piriformis Stretch  2 reps;30 seconds      Lumbar Exercises: Quadruped   Other Quadruped Lumbar Exercises  childs pose forward and laterals x 30 sec each       Moist Heat Therapy   Number Minutes Moist Heat  10 Minutes    Moist Heat Location  Lumbar Spine      Manual Therapy   Manual Therapy  Soft tissue mobilization;Myofascial release;Passive ROM    Soft tissue mobilization  lumbar parasspinals , Rt piriformis, glutes    Myofascial Release  Rt trunk in sidelying    Passive ROM  Rt hip ER and IR                PT Short Term Goals - 02/16/20 1035      PT SHORT TERM GOAL #  1   Title  Pt will be able to report leg pain improved by 25% with functional activity    Status  Achieved      PT SHORT TERM GOAL #2   Title  Pt will be able to show Independence with initial to centralize symptoms and relieve pain in LE.    Status  On-going        PT Long Term Goals - 02/23/20 1010      PT LONG TERM GOAL #1   Title  Pt will be able to wake in the morning with pain no more than 5/10    Status  Achieved      PT LONG TERM GOAL #2   Title  Pt will be able to stand, walk in the community for an hour with pain no more than 5/10.    Status  On-going      PT LONG TERM GOAL #3   Title  Pt will be able to lift, carry 15-25 lbs with good body mechanics to prevent future injury.    Status  On-going            Plan - 02/23/20 1203    Clinical Impression Statement  Patient having a good day.  Points to Rt low lumbar and prox hip.  He has good form with squats, dead lift , min cues needed.  He has 1 more visit and wil likely not need more.  Leg pain is nearly resolved (was in calf x 1 about a week ago).    PT Treatment/Interventions  ADLs/Self Care Home Management;Electrical Stimulation;Therapeutic  exercise;Moist Heat;Traction;Ultrasound;Therapeutic activities;Patient/family education;Manual techniques;Dry needling;Functional mobility training;Neuromuscular re-education;Passive range of motion;Cryotherapy;Spinal Manipulations    PT Next Visit Plan  HEP , DC? , review body mechanics, lifting, POC 3/9, add in Q ped childs pose if he doesnt have already    PT Home Exercise Plan  POE, LTR, piriformis, sidelying for Rt lateral trunk stretch/traction, hamstring    Consulted and Agree with Plan of Care  Patient       Patient will benefit from skilled therapeutic intervention in order to improve the following deficits and impairments:  Increased fascial restricitons, Impaired sensation, Improper body mechanics, Pain, Postural dysfunction, Decreased mobility, Decreased range of motion, Decreased strength, Difficulty walking, Impaired flexibility, Hypomobility  Visit Diagnosis: Radiculopathy, lumbar region     Problem List Patient Active Problem List   Diagnosis Date Noted  . Psoriasis   . Right lumbar radiculopathy 12/24/2017  . Hypertension 12/24/2012  . Arthritis 12/25/2007    Randy Collins 02/23/2020, 12:28 PM  [facility] [address] [place], [place], [address] Phone: [phone]   Fax:  [phone]  Name: Randy Collins MRN: [id number] Date of Birth: [birth year]  Karie Mainland, PT 02/23/20 12:29 PM Phone: [phone] Fax: [phone]

## 2020-02-25 ENCOUNTER — Encounter: Payer: Self-pay | Admitting: Physical Therapy

## 2020-02-25 ENCOUNTER — Other Ambulatory Visit: Payer: Self-pay

## 2020-02-25 ENCOUNTER — Ambulatory Visit: Payer: Medicaid Other | Admitting: Physical Therapy

## 2020-02-25 DIAGNOSIS — M5416 Radiculopathy, lumbar region: Secondary | ICD-10-CM | POA: Diagnosis not present

## 2020-02-25 NOTE — Therapy (Addendum)
[place], [place], [address] Phone: [phone]   Fax:  [phone]  Physical Therapy Treatment/Discharge  Patient Details  Name: Randy Collins MRN: [id number] Date of Birth: [birth year] Referring Provider (PT): Dr. Meridee Score   Encounter Date: 02/25/2020  PT End of Session - 02/25/20 0939    Visit Number  8    Number of Visits  15    Date for PT Re-Evaluation  03/01/20    Authorization Type  MCD approved 3 treatments, +12 treatments    Authorization Time Period  2/9-3/22    Authorization - Visit Number  5    Authorization - Number of Visits  12    PT Start Time  0935    PT Stop Time  1013    PT Time Calculation (min)  38 min       Past Medical History:  Diagnosis Date  . Arthritis 2009   likely psoriatic arthritis--inflammatory, but not symmetric, left hip and back, right elbow, both knees  . Hypertension 2014  . Psoriasis 2009  . Right lumbar radiculopathy 2019    History reviewed. No pertinent surgical history.  There were no vitals filed for this visit.  Subjective Assessment - 02/25/20 0938    Subjective  Today is a good day. I feel better 4/10 pain today.    Currently in Pain?  Yes    Pain Score  4     Pain Location  Back    Pain Orientation  Right    Pain Descriptors / Indicators  Aching    Pain Radiating Towards  to right knee         [facility] PT Assessment - 02/25/20 0001      Strength   Right Hip Extension  4+/5    Right Hip ABduction  4+/5    Left Hip Extension  5/5          [facility] Adult PT Treatment/Exercise - 02/25/20 0001      Therapeutic Activites    Therapeutic Activities  Lifting    Lifting  28 lbs box lift to shoulder hgt. 25 lbs Kettlebell , squatr and dead lift    sled push 300 feet with changing direction     Lumbar Exercises: Stretches   Lower Trunk Rotation Limitations  10 reps , knees crossed     ITB Stretch  2 reps;20 seconds    Piriformis Stretch   2 reps;30 seconds    Other Lumbar Stretch Exercise  childs POSE      Lumbar Exercises: Supine   Bridge  15 reps    Bridge Limitations  with PPT       Lumbar Exercises: Sidelying   Hip Abduction  20 reps;Right      Lumbar Exercises: Prone   Other Prone Lumbar Exercises  hip extension (with knee flexed) x 10 each with abdominal draw in       Lumbar Exercises: Quadruped   Other Quadruped Lumbar Exercises  childs pose forward and laterals x 30 sec each                PT Short Term Goals - 02/25/20 2353      PT SHORT TERM GOAL #1   Title  Pt will be able to report leg pain improved by 25% with functional activity    Baseline  stops more in Rt ant, lateral hip    Time  3    Period  Weeks  Status  Achieved      PT SHORT TERM GOAL #2   Title  Pt will be able to show Independence with initial to centralize symptoms and relieve pain in LE.    Baseline  can verbalize prone and extension therex that centralize sx.    Period  Weeks    Status  Achieved        PT Long Term Goals - 02/25/20 0959      PT LONG TERM GOAL #1   Title  Pt will be able to wake in the morning with pain no more than 5/10    Status  Achieved      PT LONG TERM GOAL #2   Title  Pt will be able to stand, walk in the community for an hour with pain no more than 5/10.    Status  Achieved      PT LONG TERM GOAL #3   Title  Pt will be able to lift, carry 15-25 lbs with good body mechanics to prevent future injury.    Status  Achieved            Plan - 02/25/20 1002    Clinical Impression Statement  Pt reports another good day. He reports he can go 2-3 weeks without a "bad" day which is a 10/10 pain. Most days he is 3-4/10 througout the day. Continued with lifting strategies and pt is independent with good technique. Hip strength has improved. Reviewed HEP. He reports MD visit next week and thinks the MD might recommend more PT.    PT Next Visit Plan  Discharge to HEP    PT Home Exercise Plan   POE, LTR, piriformis, sidelying for Rt lateral trunk stretch/traction, hamstring, prone hip extension,  childs pose, side hip abduction    Consulted and Agree with Plan of Care  Patient       Patient will benefit from skilled therapeutic intervention in order to improve the following deficits and impairments:  Increased fascial restricitons, Impaired sensation, Improper body mechanics, Pain, Postural dysfunction, Decreased mobility, Decreased range of motion, Decreased strength, Difficulty walking, Impaired flexibility, Hypomobility  Visit Diagnosis: Radiculopathy, lumbar region     Problem List Patient Active Problem List   Diagnosis Date Noted  . Psoriasis   . Right lumbar radiculopathy 12/24/2017  . Hypertension 12/24/2012  . Arthritis 12/25/2007    PAA,JENNIFER, PTA 02/25/2020, 12:22 PM  [facility] [address] [place], [place], [address] Phone: [phone]   Fax:  [phone]  Name: Randy Collins MRN: [id number] Date of Birth: November 19, 1961   PHYSICAL THERAPY DISCHARGE SUMMARY  Visits from Start of Care: 8  Current functional level related to goals / functional outcomes: See above    Remaining deficits: Stiffness of trunk, pain minimal most days of the week   Education / Equipment: HEP, lifting  Plan: Patient agrees to discharge.  Patient goals were met. Patient is being discharged due to meeting the stated rehab goals.  ?????     Raeford Razor, PT 02/25/20 12:22 PM Phone: [phone] Fax: [phone]

## 2020-03-03 ENCOUNTER — Encounter: Payer: Self-pay | Admitting: Physician Assistant

## 2020-03-03 ENCOUNTER — Other Ambulatory Visit: Payer: Self-pay

## 2020-03-03 ENCOUNTER — Ambulatory Visit (INDEPENDENT_AMBULATORY_CARE_PROVIDER_SITE_OTHER): Payer: Medicaid Other | Admitting: Physician Assistant

## 2020-03-03 VITALS — Ht 69.0 in | Wt 155.0 lb

## 2020-03-03 DIAGNOSIS — M5416 Radiculopathy, lumbar region: Secondary | ICD-10-CM | POA: Diagnosis not present
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
## 2021-05-25 ENCOUNTER — Other Ambulatory Visit: Payer: Self-pay

## 2021-05-25 ENCOUNTER — Emergency Department (HOSPITAL_COMMUNITY): Payer: Medicaid Other

## 2021-05-25 ENCOUNTER — Encounter (HOSPITAL_COMMUNITY): Payer: Self-pay | Admitting: Emergency Medicine

## 2021-05-25 ENCOUNTER — Emergency Department (HOSPITAL_COMMUNITY)
Admission: EM | Admit: 2021-05-25 | Discharge: 2021-05-25 | Disposition: A | Discharge status: Home / Self Care | Payer: Medicaid Other | Attending: Emergency Medicine | Admitting: Emergency Medicine

## 2021-05-25 DIAGNOSIS — M5441 Lumbago with sciatica, right side: Secondary | ICD-10-CM | POA: Diagnosis not present

## 2021-05-25 DIAGNOSIS — F17210 Nicotine dependence, cigarettes, uncomplicated: Secondary | ICD-10-CM | POA: Insufficient documentation

## 2021-05-25 DIAGNOSIS — M545 Low back pain, unspecified: Secondary | ICD-10-CM | POA: Diagnosis present

## 2021-05-25 DIAGNOSIS — I10 Essential (primary) hypertension: Secondary | ICD-10-CM | POA: Insufficient documentation

## 2021-05-25 DIAGNOSIS — Z79899 Other long term (current) drug therapy: Secondary | ICD-10-CM | POA: Diagnosis not present

## 2021-05-25 MED ORDER — DEXAMETHASONE SODIUM PHOSPHATE 10 MG/ML IJ SOLN
10.0000 mg | Freq: Once | INTRAMUSCULAR | Status: AC
  Administered 2021-05-25: 10 mg via INTRAMUSCULAR
  Filled 2021-05-25: qty 1

## 2021-05-25 MED ORDER — DIAZEPAM 5 MG PO TABS
5.0000 mg | ORAL_TABLET | Freq: Once | ORAL | Status: AC
  Administered 2021-05-25: 5 mg via ORAL
  Filled 2021-05-25: qty 1

## 2021-05-25 MED ORDER — MORPHINE SULFATE (PF) 2 MG/ML IV SOLN
2.0000 mg | Freq: Once | INTRAVENOUS | Status: AC
  Administered 2021-05-25: 2 mg via INTRAMUSCULAR
  Filled 2021-05-25: qty 1

## 2021-05-25 MED ORDER — METHOCARBAMOL 500 MG PO TABS: 500.0000 mg | ORAL_TABLET | Freq: Two times a day (BID) | ORAL | 0 refills | Status: DC

## 2021-05-25 NOTE — ED Notes (Signed)
Patient verbalizes understanding of discharge instructions. Prescriptions and follow-up care reviewed. Opportunity for questioning and answers were provided. Armband removed by staff, pt discharged from ED ambulatory.  
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
## 2021-05-25 NOTE — Discharge Instructions (Signed)
You can take 1000 mg of Tylenol.  Do not exceed 4000 mg of Tylenol a day.  Take Robaxin as prescribed. This medication will make you drowsy so do not drive or drink alcohol when taking it.  Follow-up with referred neurosurgery office for further evaluation and management of your pain.  Return to the Emergency Department immediately for any worsening back pain, neck pain, difficulty walking, numbness/weaknss of your arms or legs, urinary or bowel accidents, fever or any other worsening or concerning symptoms.

## 2021-05-25 NOTE — ED Notes (Signed)
Ambulated to the bathroom and back to bed on a steady gait. No balance issues. Will continue to monitor.

## 2021-05-25 NOTE — ED Triage Notes (Signed)
Patient presents to the ED by EMS with c/o lower back pain with radiation above the right knee. Pt walked to fire station because of back pain, history of sciatica and disc disorders. Pain with walking.
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
## 2021-10-20 ENCOUNTER — Emergency Department (HOSPITAL_COMMUNITY): Payer: Medicaid Other

## 2021-10-20 ENCOUNTER — Encounter (HOSPITAL_COMMUNITY): Payer: Self-pay | Admitting: Emergency Medicine

## 2021-10-20 ENCOUNTER — Observation Stay (HOSPITAL_COMMUNITY): Payer: Medicaid Other

## 2021-10-20 ENCOUNTER — Observation Stay (HOSPITAL_COMMUNITY)
Admission: EM | Admit: 2021-10-20 | Discharge: 2021-10-22 | Disposition: A | Discharge status: Home / Self Care | Payer: Medicaid Other | Attending: Internal Medicine | Admitting: Internal Medicine

## 2021-10-20 DIAGNOSIS — I160 Hypertensive urgency: Secondary | ICD-10-CM | POA: Diagnosis present

## 2021-10-20 DIAGNOSIS — F3189 Other bipolar disorder: Secondary | ICD-10-CM | POA: Diagnosis not present

## 2021-10-20 DIAGNOSIS — F17210 Nicotine dependence, cigarettes, uncomplicated: Secondary | ICD-10-CM | POA: Diagnosis not present

## 2021-10-20 DIAGNOSIS — M1990 Unspecified osteoarthritis, unspecified site: Secondary | ICD-10-CM | POA: Diagnosis present

## 2021-10-20 DIAGNOSIS — R778 Other specified abnormalities of plasma proteins: Secondary | ICD-10-CM | POA: Diagnosis present

## 2021-10-20 DIAGNOSIS — I10 Essential (primary) hypertension: Secondary | ICD-10-CM | POA: Diagnosis present

## 2021-10-20 DIAGNOSIS — I6381 Other cerebral infarction due to occlusion or stenosis of small artery: Secondary | ICD-10-CM | POA: Diagnosis not present

## 2021-10-20 DIAGNOSIS — R7989 Other specified abnormal findings of blood chemistry: Secondary | ICD-10-CM | POA: Diagnosis present

## 2021-10-20 DIAGNOSIS — I639 Cerebral infarction, unspecified: Secondary | ICD-10-CM | POA: Diagnosis not present

## 2021-10-20 DIAGNOSIS — Z20822 Contact with and (suspected) exposure to covid-19: Secondary | ICD-10-CM | POA: Diagnosis not present

## 2021-10-20 DIAGNOSIS — Z7982 Long term (current) use of aspirin: Secondary | ICD-10-CM | POA: Diagnosis not present

## 2021-10-20 DIAGNOSIS — M5416 Radiculopathy, lumbar region: Secondary | ICD-10-CM | POA: Diagnosis present

## 2021-10-20 DIAGNOSIS — R200 Anesthesia of skin: Secondary | ICD-10-CM | POA: Diagnosis present

## 2021-10-20 DIAGNOSIS — F39 Unspecified mood [affective] disorder: Secondary | ICD-10-CM | POA: Diagnosis present

## 2021-10-20 DIAGNOSIS — Z720 Tobacco use: Secondary | ICD-10-CM | POA: Diagnosis present

## 2021-10-20 DIAGNOSIS — G459 Transient cerebral ischemic attack, unspecified: Secondary | ICD-10-CM

## 2021-10-20 LAB — I-STAT CHEM 8, ED
BUN: 19 mg/dL (ref 8–23)
Calcium, Ion: 1.15 mmol/L (ref 1.15–1.40)
Chloride: 103 mmol/L (ref 98–111)
Creatinine, Ser: 1.3 mg/dL — ABNORMAL HIGH (ref 0.61–1.24)
Glucose, Bld: 94 mg/dL (ref 70–99)
HCT: 49 % (ref 39.0–52.0)
Hemoglobin: 16.7 g/dL (ref 13.0–17.0)
Potassium: 4.7 mmol/L (ref 3.5–5.1)
Sodium: 139 mmol/L (ref 135–145)
TCO2: 28 mmol/L (ref 22–32)

## 2021-10-20 LAB — COMPREHENSIVE METABOLIC PANEL
ALT: 20 U/L (ref 0–44)
AST: 40 U/L (ref 15–41)
Albumin: 3.4 g/dL — ABNORMAL LOW (ref 3.5–5.0)
Alkaline Phosphatase: 60 U/L (ref 38–126)
Anion gap: 6 (ref 5–15)
BUN: 15 mg/dL (ref 8–23)
CO2: 30 mmol/L (ref 22–32)
Calcium: 9 mg/dL (ref 8.9–10.3)
Chloride: 102 mmol/L (ref 98–111)
Creatinine, Ser: 1.35 mg/dL — ABNORMAL HIGH (ref 0.61–1.24)
GFR, Estimated: 60 mL/min — ABNORMAL LOW (ref >60)
Glucose, Bld: 99 mg/dL (ref 70–99)
Potassium: 4.7 mmol/L (ref 3.5–5.1)
Sodium: 138 mmol/L (ref 135–145)
Total Bilirubin: 0.9 mg/dL (ref 0.3–1.2)
Total Protein: 6 g/dL — ABNORMAL LOW (ref 6.5–8.1)

## 2021-10-20 LAB — CBC WITH DIFFERENTIAL/PLATELET
Abs Immature Granulocytes: 0.03 10*3/uL (ref 0.00–0.07)
Basophils Absolute: 0.1 10*3/uL (ref 0.0–0.1)
Basophils Relative: 1 %
Eosinophils Absolute: 0.1 10*3/uL (ref 0.0–0.5)
Eosinophils Relative: 1 %
HCT: 48 % (ref 39.0–52.0)
Hemoglobin: 15.5 g/dL (ref 13.0–17.0)
Immature Granulocytes: 0 %
Lymphocytes Relative: 22 %
Lymphs Abs: 1.6 10*3/uL (ref 0.7–4.0)
MCH: 30 pg (ref 26.0–34.0)
MCHC: 32.3 g/dL (ref 30.0–36.0)
MCV: 92.8 fL (ref 80.0–100.0)
Monocytes Absolute: 0.6 10*3/uL (ref 0.1–1.0)
Monocytes Relative: 9 %
Neutro Abs: 5 10*3/uL (ref 1.7–7.7)
Neutrophils Relative %: 67 %
Platelets: 340 10*3/uL (ref 150–400)
RBC: 5.17 MIL/uL (ref 4.22–5.81)
RDW: 13.8 % (ref 11.5–15.5)
WBC: 7.3 10*3/uL (ref 4.0–10.5)
nRBC: 0 % (ref 0.0–0.2)

## 2021-10-20 LAB — RESP PANEL BY RT-PCR (FLU A&B, COVID) ARPGX2
Influenza A by PCR: NEGATIVE
Influenza B by PCR: NEGATIVE
SARS Coronavirus 2 by RT PCR: NEGATIVE

## 2021-10-20 LAB — URINALYSIS, ROUTINE W REFLEX MICROSCOPIC
Bacteria, UA: NONE SEEN
Bilirubin Urine: NEGATIVE
Glucose, UA: NEGATIVE mg/dL
Ketones, ur: NEGATIVE mg/dL
Leukocytes,Ua: NEGATIVE
Nitrite: NEGATIVE
Protein, ur: NEGATIVE mg/dL
Specific Gravity, Urine: 1.011 (ref 1.005–1.030)
pH: 6 (ref 5.0–8.0)

## 2021-10-20 LAB — RAPID URINE DRUG SCREEN, HOSP PERFORMED
Amphetamines: NOT DETECTED
Barbiturates: NOT DETECTED
Benzodiazepines: NOT DETECTED
Cocaine: POSITIVE — AB
Opiates: NOT DETECTED
Tetrahydrocannabinol: POSITIVE — AB

## 2021-10-20 LAB — TROPONIN I (HIGH SENSITIVITY)
Troponin I (High Sensitivity): 43 ng/L — ABNORMAL HIGH (ref <18)
Troponin I (High Sensitivity): 38 ng/L — ABNORMAL HIGH (ref <18)

## 2021-10-20 LAB — PROTIME-INR
INR: 0.9 (ref 0.8–1.2)
Prothrombin Time: 12.6 seconds (ref 11.4–15.2)

## 2021-10-20 LAB — ETHANOL: Alcohol, Ethyl (B): <10 mg/dL (ref <10)

## 2021-10-20 LAB — APTT: aPTT: 25 seconds (ref 24–36)

## 2021-10-20 LAB — HIV ANTIBODY (ROUTINE TESTING W REFLEX): HIV Screen 4th Generation wRfx: NONREACTIVE

## 2021-10-20 MED ORDER — STROKE: EARLY STAGES OF RECOVERY BOOK
Freq: Once | Status: AC
  Filled 2021-10-20: qty 1

## 2021-10-20 MED ORDER — SENNOSIDES-DOCUSATE SODIUM 8.6-50 MG PO TABS: 1.0000 | ORAL_TABLET | Freq: Every evening | ORAL | Status: DC | PRN

## 2021-10-20 MED ORDER — ACETAMINOPHEN 160 MG/5ML PO SOLN: 650.0000 mg | ORAL | Status: DC | PRN

## 2021-10-20 MED ORDER — FLUTICASONE PROPIONATE 50 MCG/ACT NA SUSP: 1.0000 | Freq: Every day | NASAL | Status: DC | PRN

## 2021-10-20 MED ORDER — NICOTINE 14 MG/24HR TD PT24
14.0000 mg | MEDICATED_PATCH | TRANSDERMAL | Status: DC
  Administered 2021-10-20 – 2021-10-21 (×2): 14 mg via TRANSDERMAL
  Filled 2021-10-20 (×2): qty 1

## 2021-10-20 MED ORDER — ACETAMINOPHEN 650 MG RE SUPP: 650.0000 mg | RECTAL | Status: DC | PRN

## 2021-10-20 MED ORDER — ENOXAPARIN SODIUM 40 MG/0.4ML IJ SOSY
40.0000 mg | PREFILLED_SYRINGE | INTRAMUSCULAR | Status: DC
  Administered 2021-10-20 – 2021-10-21 (×2): 40 mg via SUBCUTANEOUS
  Filled 2021-10-20 (×2): qty 0.4

## 2021-10-20 MED ORDER — ACETAMINOPHEN 325 MG PO TABS
650.0000 mg | ORAL_TABLET | ORAL | Status: DC | PRN
  Administered 2021-10-20: 650 mg via ORAL
  Filled 2021-10-20: qty 2

## 2021-10-20 MED ORDER — ASPIRIN EC 81 MG PO TBEC
81.0000 mg | DELAYED_RELEASE_TABLET | Freq: Every day | ORAL | Status: DC
  Administered 2021-10-20 – 2021-10-22 (×3): 81 mg via ORAL
  Filled 2021-10-20 (×3): qty 1

## 2021-10-20 MED ORDER — TRAMADOL HCL 50 MG PO TABS
50.0000 mg | ORAL_TABLET | Freq: Four times a day (QID) | ORAL | Status: DC | PRN
  Administered 2021-10-21 – 2021-10-22 (×2): 50 mg via ORAL
  Filled 2021-10-20 (×2): qty 1

## 2021-10-20 MED ORDER — CLOPIDOGREL BISULFATE 75 MG PO TABS
75.0000 mg | ORAL_TABLET | Freq: Every day | ORAL | Status: DC
  Administered 2021-10-20 – 2021-10-22 (×3): 75 mg via ORAL
  Filled 2021-10-20 (×3): qty 1

## 2021-10-20 MED ORDER — CYCLOBENZAPRINE HCL 10 MG PO TABS
5.0000 mg | ORAL_TABLET | Freq: Three times a day (TID) | ORAL | Status: DC | PRN
  Administered 2021-10-20 – 2021-10-22 (×3): 5 mg via ORAL
  Filled 2021-10-20 (×3): qty 1

## 2021-10-20 MED ORDER — HYDRALAZINE HCL 20 MG/ML IJ SOLN: 10.0000 mg | Freq: Four times a day (QID) | INTRAMUSCULAR | Status: DC | PRN

## 2021-10-20 NOTE — H&P (Signed)
History and Physical    Randy Collins ZOX:[id number] DOB: Apr 18, 1961 DOA: 10/20/2021  PCP: none  Consultants:   Patient coming from:  Home - lives with his mom   Chief Complaint: right facial and right sided numbness   HPI: Randy Collins is a 60 y.o. male with medical history significant of hypertension, OA, psoriasis, tobacco abuse who presented to ED with right facial and right arm numbness. He states he had lunch and went for a walk and his blood pressure was 202/97. He thought if he kept walking it would go down, so he went for a walk and started to have right facial numbness, right shoulder numbness that went down to his fingers. He states it went from his eye down to his fingers. The numbness has almost gone away. He denies any weakness, facial droop or slurred speech. He had some associated chest pain that lasted 4-5 minutes and has had none since that time. He also has a headache and states his right eye was mildly blurry. He also had associated palpitations, but this has resolved as well. Denies any shortness of breath or cough. No stomach pain, N/V/D. No leg swelling, dysuria or urinary complaints.   He states his blood pressure used to be 240 systolic for over a year and was tried on 14 different blood pressure pills, but nothing ever worked for him.   He does smoke and drink alcohol. Typically smokes 3/4PPD.  He does drink alcohol, but socially and never has more than 2 beers/day. He states he may drink 2 times a week if that.  No withdrawals from alcohol.   +FH of MI in his brother.   ED Course: vitals: Afebrile, blood pressure 169/104 (with EMS systolic was 220), heart rate 60, respiratory rate 18, oxygen 100% on room air. Pertinent labs: Creatinine 1.35, troponin 43>  ,  Chest X-ray: No acute finding CTH: No acute intracranial hemorrhage or evidence of acute infarct. In ED code stroke was initiated and neurology was consulted.  Concern for possible stroke and recommended  admit for stroke work-up.  TRH was asked to admit.  Review of Systems: As per HPI; otherwise review of systems reviewed and negative.   Ambulatory Status:  Ambulates without assistance, but some days he will use a walker or cane    Past Medical History:  Diagnosis Date   Arthritis 2009   likely psoriatic arthritis--inflammatory, but not symmetric, left hip and back, right elbow, both knees   Hypertension 2014   Psoriasis 2009   Right lumbar radiculopathy 2019    No past surgical history on file.  Social History   Socioeconomic History   Marital status: Single    Spouse name: Not on file   Number of children: 1   Years of education: Not on file   Highest education level: 9th grade  Occupational History   Not on file  Tobacco Use   Smoking status: Every Day    Packs/day: 1.00    Types: Cigarettes   Smokeless tobacco: Never   Tobacco comments:    [facility]  Vaping Use   Vaping Use: Never used  Substance and Sexual Activity   Alcohol use: Yes    Comment: History of abuse.  Occasional beer now.   Drug use: Not Currently    Comment: history of speed, MJ, Acid.  Last time use was 1999   Sexual activity: Not Currently    Comment: "retired" Has ED  Other Topics  Concern   Not on file  Social History Narrative   Not on file   Social Determinants of Health   Financial Resource Strain: Not on file  Food Insecurity: Not on file  Transportation Needs: Not on file  Physical Activity: Not on file  Stress: Not on file  Social Connections: Not on file  Intimate Partner Violence: Not on file    No Known Allergies  Family History  Problem Relation Age of Onset   Hypertension Mother    Arthritis Mother    Gout Mother    Hyperlipidemia Mother    Gout Father    Alcohol abuse Father        probable cirrhosis   Heart disease Brother 76       AMI   HIV/AIDS Brother    Cancer Brother        not sure what primary    Prior to Admission  medications   Medication Sig Start Date End Date Taking? Authorizing Provider  amLODipine (NORVASC) 10 MG tablet Take 1 tablet (10 mg total) by mouth daily. 10/31/18   Julieanne Manson, MD  cetirizine (ZYRTEC) 10 MG tablet Take 1 tablet (10 mg total) by mouth daily. 10/31/18   Julieanne Manson, MD  gabapentin (NEURONTIN) 100 MG capsule 1 cap by mouth at bedtime and increase every 3 days to max of 3 caps 3 times daily 10/31/18   Julieanne Manson, MD  ibuprofen (ADVIL,MOTRIN) 200 MG tablet Take 200 mg by mouth every 6 (six) hours as needed.    [provider]  meloxicam (MOBIC) 15 MG tablet Take 1 tablet (15 mg total) by mouth daily. 10/31/18   Julieanne Manson, MD  methocarbamol (ROBAXIN) 500 MG tablet Take 1 tablet (500 mg total) by mouth 2 (two) times daily. 05/25/21   Maxwell Caul, PA-C  mometasone (NASONEX) 50 MCG/ACT nasal spray 2 sprays each nostril daily 10/31/18   Julieanne Manson, MD  OLANZapine (ZYPREXA) 2.5 MG tablet Take 2.5 mg by mouth at bedtime.    [provider]  predniSONE (DELTASONE) 10 MG tablet Take 1 tablet (10 mg total) by mouth daily with breakfast. 10/27/19   Persons, [place], [place]    Physical Exam: Vitals:   10/20/21 1323 10/20/21 1325 10/20/21 1415 10/20/21 1430  BP: (!) 169/104  (!) 173/100 (!) 157/122  Pulse: 60  61 (!) 59  Resp: 18  16 17   Temp: 98.8 F (37.1 C)     SpO2: 100%  99% 100%  Weight:  66.4 kg       General:  Appears calm and comfortable and is in NAD Eyes:  PERRL, EOMI, normal lids, iris ENT:  grossly normal hearing, lips & tongue, mmm; appropriate dentition Neck:  no LAD, masses or thyromegaly; no carotid bruits Cardiovascular:  RRR, no m/r/g. No LE edema.  Respiratory:   CTA bilaterally with no wheezes/rales/rhonchi.  Normal respiratory effort. Abdomen:  soft, NT, ND, NABS Back:   normal alignment, no CVAT Skin:  no rash or induration seen on limited exam Musculoskeletal:  grossly normal tone BUE/BLE, good  ROM, no bony abnormality Lower extremity:  No LE edema.  Limited foot exam with no ulcerations.  2+ distal pulses. Psychiatric:  grossly normal mood and affect, speech fluent and appropriate, AOx3 Neurologic:  CN 2-12 grossly intact right pupil appears slightly more constricted than left pupil.  moves all extremities in coordinated fashion, sensation intact. Normal finger to nose bilaterally, normal heel to shin bilaterally. DTR 2+ gait deferred.  Radiological Exams on Admission: Independently reviewed - see discussion in A/P where applicable  DG Chest 2 View  Result Date: 10/20/2021 CLINICAL DATA:  Chest pain EXAM: CHEST - 2 VIEW COMPARISON:  07/30/2010 FINDINGS: The heart size and mediastinal contours are within normal limits. Both lungs are clear. The visualized skeletal structures are unremarkable. IMPRESSION: No acute abnormality of the lungs. Electronically Signed   By: Jearld Lesch M.D.   On: 10/20/2021 14:12   CT HEAD CODE STROKE WO CONTRAST  Result Date: 10/20/2021 CLINICAL DATA:  Code stroke.  Right-sided numbness EXAM: CT HEAD WITHOUT CONTRAST TECHNIQUE: Contiguous axial images were obtained from the base of the skull through the vertex without intravenous contrast. COMPARISON:  None. FINDINGS: Brain: There is no acute intracranial hemorrhage, mass effect, or edema. Gray-white differentiation is preserved. Ventricles and sulci are within normal limits in size and configuration. No extra-axial collection. Vascular: No hyperdense vessel. Skull: Unremarkable. Sinuses/Orbits: No acute abnormality. Other: Mastoid air cells are clear. ASPECTS ([place]ta Stroke Program Early CT Score) - Ganglionic level infarction (caudate, lentiform nuclei, internal capsule, insula, M1-M3 cortex): 7 - Supraganglionic infarction (M4-M6 cortex): 3 Total score (0-10 with 10 being normal): 10 IMPRESSION: There is no acute intracranial hemorrhage or evidence of acute infarction. ASPECT score is 10. These results  were communicated to Dr. Derry Lory at 1:54 pm on 10/20/2021 by text page via the [facility] messaging system. Electronically Signed   By: Guadlupe Spanish M.D.   On: 10/20/2021 13:58    EKG: Independently reviewed.  NSR with rate 63; nonspecific ST changes with no evidence of acute ischemia Some ST elevation.   Labs on Admission: I have personally reviewed the available labs and imaging studies at the time of the admission.  Pertinent labs:  Creatinine 1.35,  troponin 43> 38    Assessment/Plan Active Problems:   Hypertensive urgency with right arm numbness and right facial numbness with blurry vision 60 year old man with multiple risk factors for stroke including uncontrolled hypertension, tobacco abuse, and family history who presented to ED with elevated blood pressure to over 200 systolic and acute onset of right facial numbness and right arm numbness. -place in observation on telemetry for TIA/stroke work-up -Neurochecks per protocol -Neurology consulted -MRI brain without contrast, MRA angio head and US carotid -echo -lipid panel and a1c pending  - daily aspirin 81 mg and start Plavix 75 mg x 3 weeks then aspirin alone per neurology recommendation -Permissive hypertension first 24 hours <220/110 (prn hydralazine)  -N.p.o. until bedside swallow screen-passed -PT/ OT/ SLP consult    Elevated troponin -initial troponin at 43 delta pending he also has ST elevation on EKG however he has no complaints of chest pain. ED discussed with cardiology on-call who stated they thought EKG changes were due to hypertensive urgency however if delta troponin continues to trend up we will officially consult cardiology -Placed on telemetry -delta 38.   Hypertension -On no medication and states he has been tried on 15 meds without any improvement in blood pressure however unsure his compliance. -Blood pressure does not respond would recommend renal artery duplex  -permissive HTN in first 24 hours with  prn IV parameters.     Mood disorder (HCC) -currently on no medication. Was on zyprexa and thought he was supposed to stop when it ran out. Stable -psychiatry has been consulted and will see tomorrow     Right lumbar radiculopathy -muscle relaxer and tramadol prn.     Tobacco abuse -nicotine patch, encouraged to quit.  Arthritis  Likely has psoriatic arthritis. Currently on no medication. F/u outpatient.     Body mass index is 21.62 kg/m.    Level of care: Telemetry Medical DVT prophylaxis:  Lovenox Code Status:  Full - confirmed with patient Family Communication: None present Disposition Plan:  The patient is from: home  Anticipated d/c is to: home   Requires inpatient hospitalization and is at significant risk of neurological worsening, requires constant monitoring, assessment and MDM with specialists.    Patient is currently: stable  Consults called: neurology and psychiatry  Admission status:  observation   Dragon dictation used in completing this note.   Orland Mustard MD [facility]   How to contact the [facility] Attending or Consulting provider 7A - 7P or covering provider during after hours 7P -7A, for this patient?  Check the care team in [facility] and look for a) attending/consulting [facility] provider listed and b) the [facility] team listed Log into [email or web address] and use [place]'s universal password to access. If you do not have the password, please contact the hospital operator. Locate the [facility] provider you are looking for under [facility] and page to a number that you can be directly reached. If you still have difficulty reaching the provider, please page the [facility] (Director on Call) for the Hospitalists listed on amion for assistance.   10/20/2021, 3:49 PM

## 2021-10-20 NOTE — Progress Notes (Signed)
Received patient from MRI; oriented to room and unit routine; vital signs taken; NIH preformed; fall/safety reviewed; bed alarm set.

## 2021-10-20 NOTE — ED Triage Notes (Signed)
Patient BIB [facility] from [place] [employer] for evaluation of paraesthesia in extremities and hypertension that started a few days ago. Blood pressure initially 220 systolic with EMS, decreased to 170 during transport. Patient alert, oriented, and in no apparent distress this time.

## 2021-10-20 NOTE — ED Notes (Signed)
Patient transported to MRI 

## 2021-10-20 NOTE — ED Notes (Signed)
Pt to room 23 from CT and XR.

## 2021-10-20 NOTE — Consult Note (Signed)
NEUROLOGY CONSULTATION NOTE   Date of service: October 20, 2021 Patient Name: Randy Collins MRN:  [id number] DOB:  03/04/1961 Reason for consult: "Stroke code for R sided numbness" Requesting Provider: Pricilla Loveless, MD _ _ _   _ __   _ __ _ _  __ __   _ __   __ _  History of Present Illness  Randy Collins is a 60 y.o. male with PMH significant for hypertension, psoriasis, right lumbar radiculopathy, smoker who presents with an acute onset episode of right facial and right arm numbness with a last known well of 1130 on 10/12/2021.  Patient had just finished his lunch when the symptoms started abruptly.  The symptoms persisted so he came into the emergency department.  He reports that he was at a [place] shop when he started having the symptoms.  He also reports that last week he had an episode of pain in his shoulders with paresthesias in his arms.   MRS: 0 TNKase/thrombectomy: Not offered due to mild symptoms not felt to be disabling enough to risk intervention. Last known well: 1130 on 10/20/2021. NIHSS components Score: Comment  1a Level of Conscious 0[x]  1[]  2[]  3[]      1b LOC Questions 0[x]  1[]  2[]       1c LOC Commands 0[x]  1[]  2[]       2 Best Gaze 0[x]  1[]  2[]       3 Visual 0[x]  1[]  2[]  3[]      4 Facial Palsy 0[x]  1[]  2[]  3[]      5a Motor Arm - left 0[x]  1[]  2[]  3[]  4[]  UN[]    5b Motor Arm - Right 0[x]  1[]  2[]  3[]  4[]  UN[]    6a Motor Leg - Left 0[x]  1[]  2[]  3[]  4[]  UN[]    6b Motor Leg - Right 0[x]  1[]  2[]  3[]  4[]  UN[]    7 Limb Ataxia 0[x]  1[]  2[]  3[]  UN[]     8 Sensory 0[]  1[x]  2[]  UN[]      9 Best Language 0[x]  1[]  2[]  3[]      10 Dysarthria 0[x]  1[]  2[]  UN[]      11 Extinct. and Inattention 0[x]  1[]  2[]       TOTAL: 1           has a past medical history of Arthritis (2009), Hypertension (2014), Psoriasis (2009), and Right lumbar radiculopathy (2019). who presents with     ROS   Constitutional Denies weight loss, fever and chills.   HEENT Denies changes in vision  and hearing.   Respiratory Denies SOB and cough.   CV Denies palpitations and CP   GI Denies abdominal pain, nausea, vomiting and diarrhea.   GU Denies dysuria and urinary frequency.   MSK Denies myalgia and joint pain.   Skin Denies rash and pruritus.   Neurological Denies headache and syncope.   Psychiatric Denies recent changes in mood. Denies anxiety and depression.    Past History   Past Medical History:  Diagnosis Date   Arthritis 2009   likely psoriatic arthritis--inflammatory, but not symmetric, left hip and back, right elbow, both knees   Hypertension 2014   Psoriasis 2009   Right lumbar radiculopathy 2019   No past surgical history on file. Family History  Problem Relation Age of Onset   Hypertension Mother    Arthritis Mother    Gout Mother    Hyperlipidemia Mother    Gout Father    Alcohol abuse Father        probable cirrhosis   Heart disease Brother 52  AMI   HIV/AIDS Brother    Cancer Brother        not sure what primary   Social History   Socioeconomic History   Marital status: Single    Spouse name: Not on file   Number of children: 1   Years of education: Not on file   Highest education level: 9th grade  Occupational History   Not on file  Tobacco Use   Smoking status: Every Day    Packs/day: 1.00    Types: Cigarettes   Smokeless tobacco: Never   Tobacco comments:    [facility]  Vaping Use   Vaping Use: Never used  Substance and Sexual Activity   Alcohol use: Yes    Comment: History of abuse.  Occasional beer now.   Drug use: Not Currently    Comment: history of speed, MJ, Acid.  Last time use was 1999   Sexual activity: Not Currently    Comment: "retired" Has ED  Other Topics Concern   Not on file  Social History Narrative   Not on file   Social Determinants of Health   Financial Resource Strain: Not on file  Food Insecurity: Not on file  Transportation Needs: Not on file  Physical Activity: Not  on file  Stress: Not on file  Social Connections: Not on file   No Known Allergies  Medications  (Not in a hospital admission)    Vitals   Vitals:   10/20/21 1323 10/20/21 1325  BP: (!) 169/104   Pulse: 60   Resp: 18   Temp: 98.8 F (37.1 C)   SpO2: 100%   Weight:  66.4 kg     Body mass index is 21.62 kg/m.  Physical Exam   General: Laying comfortably in bed; in no acute distress.  HENT: Normal oropharynx and mucosa. Normal external appearance of ears and nose.  Neck: Supple, no pain or tenderness  CV: No JVD. No peripheral edema.  Pulmonary: Symmetric Chest rise. Normal respiratory effort.  Abdomen: Soft to touch, non-tender.  Ext: No cyanosis, edema, or deformity  Skin: No rash. Normal palpation of skin.   Musculoskeletal: Normal digits and nails by inspection. No clubbing.   Neurologic Examination  Mental status/Cognition: Alert, oriented to self, place, month and year, good attention.  Speech/language: Fluent, comprehension intact, object naming intact, repetition intact.  Cranial nerves:   CN II Pupils equal and reactive to light, no VF deficits    CN III,IV,VI EOM intact, no gaze preference or deviation, no nystagmus    CN V normal sensation in V1, V2, and V3 segments bilaterally    CN VII no asymmetry, no nasolabial fold flattening    CN VIII normal hearing to speech    CN IX & X normal palatal elevation, no uvular deviation    CN XI 5/5 head turn and 5/5 shoulder shrug bilaterally    CN XII midline tongue protrusion    Motor:  Muscle bulk: normal, tone normal, pronator drift none tremor none Mvmt Root Nerve  Muscle Right Left Comments  SA C5/6 Ax Deltoid 5 5   EF C5/6 Mc Biceps 5 5   EE C6/7/8 Rad Triceps 5 5   WF C6/7 Med FCR     WE C7/8 PIN ECU     F Ab C8/T1 U ADM/FDI 5 5   HF L1/2/3 Fem Illopsoas 5 5   KE L2/3/4 Fem Quad 5 5   DF L4/5 D Peron Tib Ant  5 5   PF S1/2 Tibial Grc/Sol 5 5    Reflexes:  Right Left Comments  Pectoralis       Biceps (C5/6) 2 2   Brachioradialis (C5/6) 2 2    Triceps (C6/7) 2 2    Patellar (L3/4) 2 2    Achilles (S1)      Hoffman      Plantar     Jaw jerk    Sensation:  Light touch Decreased to touch in right lower face in the entire right arm from shoulder below.   Pin prick    Temperature    Vibration   Proprioception    Coordination/Complex Motor:  - Finger to Nose intact bilaterally - Heel to shin intact bilaterally - Rapid alternating movement are normal - Gait: Stride length short. Arm swing poor. Base width narrow  Labs   CBC:  Recent Labs  Lab 10/20/21 1327 10/20/21 1345  WBC 7.3  --   NEUTROABS 5.0  --   HGB 15.5 16.7  HCT 48.0 49.0  MCV 92.8  --   PLT 340  --     Basic Metabolic Panel:  Lab Results  Component Value Date   NA 139 10/20/2021   K 4.7 10/20/2021   CO2 25 11/07/2018   GLUCOSE 94 10/20/2021   BUN 19 10/20/2021   CREATININE 1.30 (H) 10/20/2021   CALCIUM 9.4 11/07/2018   GFRNONAA 75 11/07/2018   GFRAA 87 11/07/2018   Lipid Panel:  Lab Results  Component Value Date   LDLCALC 73 11/07/2018   HgbA1c: No results found for: HGBA1C Urine Drug Screen: No results found for: LABOPIA, COCAINSCRNUR, LABBENZ, AMPHETMU, THCU, LABBARB  Alcohol Level No results found for: ETH  CT Head without contrast: I personally reviewed CT head without contrast which was negative for a large territory stroke or ICH. Impression   Randy Collins is a 60 y.o. male with PMH significant for arthritis, hypertension, psoriasis, smoker who presents with right facial and right arm numbness.  Symptoms are mild and he was not offered TNKase or thrombectomy.  I suspect this is probably a small lacunar stroke.  Primary Diagnosis:  Other cerebral infarction due to occlusion of stenosis of small artery.  Secondary Diagnosis: Essential (primary) hypertension  Recommendations  Plan:  Recommend that primary team order following: - Frequent Neuro checks per stroke unit  protocol - Recommend brain imaging with MRI Brain without contrast - Recommend Vascular imaging with MRA Angio Head without contrast and US Carotid doppler - Recommend obtaining TTE  - Recommend obtaining Lipid panel with LDL - Please start statin if LDL > 70 - Recommend HbA1c - Antithrombotic - aspirin 81mg  daily along with plavix 75mg  daily for 21 days followed by aspirin 81mg  daily alone. - Recommend DVT ppx - SBP goal - permissive hypertension first 24 h < 220/110. Held home meds.  - Recommend Telemetry monitoring for arrythmia - Recommend bedside swallow screen prior to PO intake. - Stroke education booklet - Recommend PT/OT/SLP consult - Counseled on the importance of quitting smoking to prevent further strokes. -Recommend urine tox screen.  ______________________________________________________________________  Plan discussed with Dr. in person.  Thank you for the opportunity to take part in the care of this patient. If you have any further questions, please contact the neurology consultation attending.  Signed,  [facility] Pager Number _ _ _   _ __   _ __ _ _  __ __   _ __  __ _  

## 2021-10-20 NOTE — ED Provider Notes (Signed)
[facility] [address] EMERGENCY DEPARTMENT Provider Note   CSN: [id number] Arrival date & time: 10/20/21  1313     History Chief Complaint  Patient presents with   Hypertension    Randy Collins is a 60 y.o. male.  HPI 60 year old male presents with a chief complaint of hypertension.  Patient has been dealing with some high blood pressure over the last few days.  Has been using his mom's cuff and has had blood pressures around 200/90 for the past 3 days.  He chronically has some hypertension issues and used to be on meds but states he was taken off of all of these and his blood pressure was never really controlled.  This afternoon about 1 hour prior to arrival he endorses acute onset of a "grabbing" chest pain on the right side that was brief.  However he also endorses an acute onset an hour ago of right-sided facial numbness and right-sided arm numbness.  He feels like both of his arms are weak and heavy.  Slight headache.  Did not have pain radiating to the back.  Did have some transient dyspnea.  Overall the chest discomfort lasted about 20 minutes.  Past Medical History:  Diagnosis Date   Arthritis 2009   likely psoriatic arthritis--inflammatory, but not symmetric, left hip and back, right elbow, both knees   Hypertension 2014   Psoriasis 2009   Right lumbar radiculopathy 2019    Patient Active Problem List   Diagnosis Date Noted   Hypertensive urgency 10/20/2021   Tobacco abuse 10/20/2021   Right arm numbness 10/20/2021   Elevated troponin 10/20/2021   Psoriasis    Right lumbar radiculopathy 12/24/2017   Hypertension 12/24/2012   Arthritis 12/25/2007    No past surgical history on file.     Family History  Problem Relation Age of Onset   Hypertension Mother    Arthritis Mother    Gout Mother    Hyperlipidemia Mother    Gout Father    Alcohol abuse Father        probable cirrhosis   Heart disease Brother 41       AMI   HIV/AIDS Brother    Cancer  Brother        not sure what primary    Social History   Tobacco Use   Smoking status: Every Day    Packs/day: 1.00    Types: Cigarettes   Smokeless tobacco: Never   Tobacco comments:    [facility]  Vaping Use   Vaping Use: Never used  Substance Use Topics   Alcohol use: Yes    Comment: History of abuse.  Occasional beer now.   Drug use: Not Currently    Comment: history of speed, MJ, Acid.  Last time use was 1999    Home Medications Prior to Admission medications   Medication Sig Start Date End Date Taking? Authorizing Provider  Aspirin-Caffeine (BAYER BACK & BODY PAIN EX ST) 500-32.5 MG TABS Take 2 tablets by mouth 2 (two) times daily as needed (hip pain).   Yes [provider]  mometasone (NASONEX) 50 MCG/ACT nasal spray 2 sprays each nostril daily Patient taking differently: Place 2 sprays into the nose daily as needed (allergies). 10/31/18  Yes Julieanne Manson, MD  amLODipine (NORVASC) 10 MG tablet Take 1 tablet (10 mg total) by mouth daily. Patient not taking: No sig reported 10/31/18   Julieanne Manson, MD  gabapentin (NEURONTIN) 100 MG capsule 1 cap by  mouth at bedtime and increase every 3 days to max of 3 caps 3 times daily Patient not taking: No sig reported 10/31/18   Julieanne Manson, MD  methocarbamol (ROBAXIN) 500 MG tablet Take 1 tablet (500 mg total) by mouth 2 (two) times daily. Patient not taking: No sig reported 05/25/21   Graciella Freer A, PA-C  OLANZapine (ZYPREXA) 2.5 MG tablet Take 2.5 mg by mouth at bedtime. Patient not taking: Reported on 10/20/2021    [provider]    Allergies    Patient has no known allergies.  Review of Systems   Review of Systems  Respiratory:  Positive for shortness of breath.   Cardiovascular:  Positive for chest pain.  Musculoskeletal:  Negative for back pain.  Neurological:  Positive for weakness, numbness and headaches.  All other systems reviewed and are  negative.  Physical Exam Updated Vital Signs BP (!) 157/122   Pulse (!) 59   Temp 98.8 F (37.1 C)   Resp 17   Wt 66.4 kg   SpO2 100%   BMI 21.62 kg/m   Physical Exam Vitals and nursing note reviewed.  Constitutional:      Appearance: He is well-developed.  HENT:     Head: Normocephalic and atraumatic.     Right Ear: External ear normal.     Left Ear: External ear normal.     Nose: Nose normal.  Eyes:     General:        Right eye: No discharge.        Left eye: No discharge.  Cardiovascular:     Rate and Rhythm: Normal rate and regular rhythm.     Heart sounds: Normal heart sounds.  Pulmonary:     Effort: Pulmonary effort is normal.     Breath sounds: Normal breath sounds.  Abdominal:     Palpations: Abdomen is soft.     Tenderness: There is no abdominal tenderness.  Musculoskeletal:     Cervical back: Neck supple.  Skin:    General: Skin is warm and dry.  Neurological:     Mental Status: He is alert.     Comments: CN 3-12 grossly intact. 5/5 strength in all 4 extremities.  Subjective decreased sensation to the right face and right arm compared to the left side.  Grossly normal sensation in the right leg.  Normal finger to nose.   Psychiatric:        Mood and Affect: Mood is not anxious.    ED Results / Procedures / Treatments   Labs (all labs ordered are listed, but only abnormal results are displayed) Labs Reviewed  COMPREHENSIVE METABOLIC PANEL - Abnormal; Notable for the following components:      Result Value   Creatinine, Ser 1.35 (*)    Total Protein 6.0 (*)    Albumin 3.4 (*)    GFR, Estimated 60 (*)    All other components within normal limits  I-STAT CHEM 8, ED - Abnormal; Notable for the following components:   Creatinine, Ser 1.30 (*)    All other components within normal limits  TROPONIN I (HIGH SENSITIVITY) - Abnormal; Notable for the following components:   Troponin I (High Sensitivity) 43 (*)    All other components within normal limits   RESP PANEL BY RT-PCR (FLU A&B, COVID) ARPGX2  ETHANOL  PROTIME-INR  APTT  CBC WITH DIFFERENTIAL/PLATELET  RAPID URINE DRUG SCREEN, HOSP PERFORMED  URINALYSIS, ROUTINE W REFLEX MICROSCOPIC  TROPONIN I (HIGH SENSITIVITY)  EKG EKG Interpretation  Date/Time:  Friday October 20 2021 14:14:51 EDT Ventricular Rate:  63 PR Interval:  97 QRS Duration: 95 QT Interval:  436 QTC Calculation: 447 R Axis:   63 Text Interpretation: Sinus rhythm Short PR interval Left ventricular hypertrophy Borderline T abnormalities, inferior leads  ST elevations similar to earlier in the day Confirmed by Pricilla Loveless [phone]) on 10/20/2021 2:26:30 PM  Radiology DG Chest 2 View  Result Date: 10/20/2021 CLINICAL DATA:  Chest pain EXAM: CHEST - 2 VIEW COMPARISON:  07/30/2010 FINDINGS: The heart size and mediastinal contours are within normal limits. Both lungs are clear. The visualized skeletal structures are unremarkable. IMPRESSION: No acute abnormality of the lungs. Electronically Signed   By: Jearld Lesch M.D.   On: 10/20/2021 14:12   CT HEAD CODE STROKE WO CONTRAST  Result Date: 10/20/2021 CLINICAL DATA:  Code stroke.  Right-sided numbness EXAM: CT HEAD WITHOUT CONTRAST TECHNIQUE: Contiguous axial images were obtained from the base of the skull through the vertex without intravenous contrast. COMPARISON:  None. FINDINGS: Brain: There is no acute intracranial hemorrhage, mass effect, or edema. Gray-white differentiation is preserved. Ventricles and sulci are within normal limits in size and configuration. No extra-axial collection. Vascular: No hyperdense vessel. Skull: Unremarkable. Sinuses/Orbits: No acute abnormality. Other: Mastoid air cells are clear. ASPECTS ([place]berta Stroke Program Early CT Score) - Ganglionic level infarction (caudate, lentiform nuclei, internal capsule, insula, M1-M3 cortex): 7 - Supraganglionic infarction (M4-M6 cortex): 3 Total score (0-10 with 10 being normal): 10 IMPRESSION:  There is no acute intracranial hemorrhage or evidence of acute infarction. ASPECT score is 10. These results were communicated to Dr. Derry Lory at 1:54 pm on 10/20/2021 by text page via the [facility] messaging system. Electronically Signed   By: Guadlupe Spanish M.D.   On: 10/20/2021 13:58    Procedures Procedures   Medications Ordered in ED Medications - No data to display  ED Course  I have reviewed the triage vital signs and the nursing notes.  Pertinent labs & imaging results that were available during my care of the patient were reviewed by me and considered in my medical decision making (see chart for details).  Clinical Course as of 10/20/21 1521  Fri Oct 20, 2021  1339 I went to go see patient as I was handed and his significantly abnormal ECG.  He does note some transient chest pain but currently has the numbness on the right face and arm that are within an hour of onset.  Thus a code stroke was called. [SG]  1347 D/w Dr. Excell Seltzer. Most likely this is LVH related from significant hypertension.  Unlikely to be STEMI and he would recommend troponins and consulting cardiology if positive but otherwise no emergent Cath Lab [SG]    Clinical Course User Index [SG] Pricilla Loveless, MD   MDM Rules/Calculators/A&P                           Neurology has seen and given the minor symptoms discussed not doing tPA despite being within code stroke window given his likely lacunar infarct.  Otherwise, troponin is mildly elevated but more going along with hypertension rather than ACS.  He remains chest pain-free.  Discussed again with Dr. Excell Seltzer, advises second troponin but still no acute intervention.  Dr. Artis Flock to admit. Final Clinical Impression(s) / ED Diagnoses Final diagnoses:  Acute ischemic stroke [facility])    Rx / DC Orders ED Discharge Orders  None        Pricilla Loveless, MD 10/20/21 [phone]

## 2021-10-20 NOTE — Code Documentation (Signed)
Stroke Response Nurse Documentation Code Documentation  Randy Collins is a 60 y.o. male arriving to [facility] ED via [facility] on 10/20/21 with past medical hx significant of hypertension. Code stroke was activated by ED.   Patient from home where he was LKW approximately 1130. 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 Noted left face and left arm numbness. Reports a similar feeling a couple weeks ago involving his right arm only. Symptoms resolved approximately  2 hours later.  Stroke team at the bedside on patient arrival. Labs drawn and patient cleared for CT by Dr. Criss Alvine. Patient to CT with team. NIHSS 1, see documentation for details and code stroke times. Patient with right decreased sensation on exam. The following imaging was completed: CT. Patient is not a candidate for IV Thrombolytic due to too mild to treat. Patient is not a candidate for IR due to LVO negative.   Care/Plan: monitor Q15 VS and Q30 min neuro checks until out of window for thrombolytics. Notify stroke team if worsening neuro status. MRI.  Bedside handoff with ED RN Mariane Duval.    Ferman Hamming Stroke Response RN

## 2021-10-21 ENCOUNTER — Other Ambulatory Visit: Payer: Self-pay

## 2021-10-21 ENCOUNTER — Encounter (HOSPITAL_COMMUNITY): Payer: Self-pay | Admitting: Family Medicine

## 2021-10-21 ENCOUNTER — Observation Stay (HOSPITAL_BASED_OUTPATIENT_CLINIC_OR_DEPARTMENT_OTHER): Payer: Medicaid Other

## 2021-10-21 DIAGNOSIS — F3189 Other bipolar disorder: Secondary | ICD-10-CM | POA: Diagnosis not present

## 2021-10-21 DIAGNOSIS — I160 Hypertensive urgency: Secondary | ICD-10-CM

## 2021-10-21 DIAGNOSIS — G459 Transient cerebral ischemic attack, unspecified: Secondary | ICD-10-CM | POA: Diagnosis not present

## 2021-10-21 DIAGNOSIS — R200 Anesthesia of skin: Secondary | ICD-10-CM | POA: Diagnosis not present

## 2021-10-21 DIAGNOSIS — I639 Cerebral infarction, unspecified: Secondary | ICD-10-CM | POA: Diagnosis not present

## 2021-10-21 LAB — LIPID PANEL
Cholesterol: 163 mg/dL (ref 0–200)
HDL: 70 mg/dL (ref >40)
LDL Cholesterol: 80 mg/dL (ref 0–99)
Total CHOL/HDL Ratio: 2.3 RATIO
Triglycerides: 65 mg/dL (ref <150)
VLDL: 13 mg/dL (ref 0–40)

## 2021-10-21 LAB — ECHOCARDIOGRAM COMPLETE
AV Mean grad: 4 mmHg
AV Peak grad: 7.1 mmHg
Ao pk vel: 1.33 m/s
Area-P 1/2: 3.34 cm2
Weight: 2342.17 oz

## 2021-10-21 LAB — HEMOGLOBIN A1C
Hgb A1c MFr Bld: 5.4 % (ref 4.8–5.6)
Mean Plasma Glucose: 108.28 mg/dL

## 2021-10-21 MED ORDER — ATORVASTATIN CALCIUM 40 MG PO TABS
40.0000 mg | ORAL_TABLET | Freq: Every day | ORAL | Status: DC
  Administered 2021-10-21 – 2021-10-22 (×2): 40 mg via ORAL
  Filled 2021-10-21 (×2): qty 1

## 2021-10-21 NOTE — Progress Notes (Signed)
Carotid duplex bilateral study completed.   Please see CV Proc for preliminary results.   Silverio Hagan, RDMS, RVT  

## 2021-10-21 NOTE — Progress Notes (Addendum)
STROKE TEAM PROGRESS NOTE   ATTENDING NOTE: I reviewed above note and agree with the assessment and plan. Pt was seen and examined.   60 year old male with history of hypertension, smoker and lower back pain with right sciatica admitted for right face and arm numbness and high blood pressure.  Currently patient right arm numbness resolved, right face only complaining mild tingling at periorbital area on the right.  CT no acute abnormality.  MRI no acute infarct.  MRA and prior Doppler unremarkable.  EF 60 to 65%.  LDL 88, A1c 5.4.  Creatinine 1.30.  UDS showed positive for cocaine and THC.  On exam, patient awake alert, orientated x3, no aphasia, no dysarthria, follows simple commands.  Visual fold full, no gaze palsy, facial symmetrical.  Right periorbital decreased light touch sensation subjectively.  Tongue midline.  Bilateral upper and lower extremity equal strengths and sensation.  Finger-to-nose intact bilaterally.  Etiology for patient symptoms concerning for TIA in the setting of stroke risk factors including cocaine use, smoker, THC use, hypertension.  Recommend aspirin 81 and the Plavix 75 DAPT for 3 weeks and then aspirin alone.  Continue Lipitor 40.  Tobacco, THC and cocaine cessation education provided.  PT/OT no recommendation.  Aggressive stroke risk factor modification.  For detailed assessment and plan, please refer to above as I have made changes wherever appropriate.   Neurology will sign off. Please call with questions. Pt will follow up with stroke clinic NP at [facility] in about 4 weeks. Thanks for the consult.  Marvel Plan, MD PhD Stroke Neurology 10/21/2021 1:30 PM    INTERVAL HISTORY No one is at the bedside at time of this exam. Neuroimaging is unremarkable. No further inpatient stroke work up indicated and we will sign off at this time but remain available to assist.   Vitals:   10/21/21 0222 10/21/21 0317 10/21/21 0428 10/21/21 0714  BP: (!) 145/91 (!) 155/115 (!)  144/77 (!) 156/96  Pulse: (!) 52 (!) 57 (!) 58 60  Resp: Temp: (!) 97.4 F (36.3 C) (!) 97.5 F (36.4 C) (!) 97.4 F (36.3 C) 97.8 F (36.6 C)  TempSrc: Axillary Oral Axillary Axillary  SpO2: 98% 96%  96%  Weight:       CBC:  Recent Labs  Lab 10/20/21 1327 10/20/21 1345  WBC 7.3  --   NEUTROABS 5.0  --   HGB 15.5 16.7  HCT 48.0 49.0  MCV 92.8  --   PLT 340  --    Basic Metabolic Panel:  Recent Labs  Lab 10/20/21 1338 10/20/21 1345  NA 138 139  K 4.7 4.7  CL 102 103  CO2 30  --   GLUCOSE 99 94  BUN 15 19  CREATININE 1.35* 1.30*  CALCIUM 9.0  --     Lipid Panel:  Recent Labs  Lab 10/21/21 0623  CHOL 163  TRIG 65  HDL 70  CHOLHDL 2.3  VLDL 13  LDLCALC 80    HgbA1c:  Recent Labs  Lab 10/21/21 0623  HGBA1C 5.4   Urine Drug Screen:  Recent Labs  Lab 10/20/21 1338  LABOPIA NONE DETECTED  COCAINSCRNUR POSITIVE*  LABBENZ NONE DETECTED  AMPHETMU NONE DETECTED  THCU POSITIVE*  LABBARB NONE DETECTED    Alcohol Level  Recent Labs  Lab 10/20/21 1338  ETH <10    IMAGING past 24 hours DG Chest 2 View  Result Date: 10/20/2021 CLINICAL DATA:  Chest pain EXAM: CHEST - 2 VIEW  COMPARISON:  07/30/2010 FINDINGS: The heart size and mediastinal contours are within normal limits. Both lungs are clear. The visualized skeletal structures are unremarkable. IMPRESSION: No acute abnormality of the lungs. Electronically Signed   By: Jearld Lesch M.D.   On: 10/20/2021 14:12   MR ANGIO HEAD WO CONTRAST  Result Date: 10/20/2021 CLINICAL DATA:  Right-sided facial and upper extremity numbness. Bilateral upper extremity weakness. EXAM: MRI HEAD WITHOUT CONTRAST MRA HEAD WITHOUT CONTRAST TECHNIQUE: Multiplanar, multi-echo pulse sequences of the brain and surrounding structures were acquired without intravenous contrast. Angiographic images of the Circle of Willis were acquired using MRA technique without intravenous contrast. COMPARISON:  CT head without  contrast 10/20/2021 FINDINGS: MRI HEAD FINDINGS Brain: Mild atrophy and white matter changes are likely within normal limits for age. No acute infarct, hemorrhage, or mass lesion is present. The ventricles are of normal size. No significant extraaxial fluid collection is present. The internal auditory canals are within normal limits. The brainstem and cerebellum are within normal limits. Vascular: Flow is present in the major intracranial arteries. Skull and upper cervical spine: The craniocervical junction is normal. Upper cervical spine is within normal limits. Marrow signal is unremarkable. Sinuses/Orbits: A polyp or mucous retention cyst is noted in the inferior left maxillary sinus. The paranasal sinuses and mastoid air cells are otherwise clear. MRA HEAD FINDINGS Anterior circulation: Mild atherosclerotic irregularity is present in the cavernous internal carotid arteries bilaterally without significant stenosis. Moderate proximal right A1 segment stenosis is present. Mild narrowing is present at the proximal left A1. Mm 1 segments are within normal limits bilaterally. MCA bifurcations are intact. There is some attenuation of distal MCA branch vessels bilaterally. Posterior circulation: The right vertebral artery is dominant. Fenestrated vertebrobasilar junction noted without aneurysm. Prominent AICA vessels are noted bilaterally. Basilar artery is normal. Both posterior cerebral arteries originate from basilar tip. Mild narrowing of the superior right P3 segment noted. PCA branch vessels are otherwise within normal limits. Anatomic variants: None IMPRESSION: 1. Normal MRI appearance of the brain for age. No acute or focal lesion to explain the patient's symptoms. 2. Mild distal small vessel disease. 3. Moderate proximal right A1 segment stenosis. 4. No other significant proximal stenosis, aneurysm, or branch vessel occlusion. Electronically Signed   By: Marin Roberts M.D.   On: 10/20/2021 18:24   MR  BRAIN WO CONTRAST  Result Date: 10/20/2021 CLINICAL DATA:  Right-sided facial and upper extremity numbness. Bilateral upper extremity weakness. EXAM: MRI HEAD WITHOUT CONTRAST MRA HEAD WITHOUT CONTRAST TECHNIQUE: Multiplanar, multi-echo pulse sequences of the brain and surrounding structures were acquired without intravenous contrast. Angiographic images of the Circle of Willis were acquired using MRA technique without intravenous contrast. COMPARISON:  CT head without contrast 10/20/2021 FINDINGS: MRI HEAD FINDINGS Brain: Mild atrophy and white matter changes are likely within normal limits for age. No acute infarct, hemorrhage, or mass lesion is present. The ventricles are of normal size. No significant extraaxial fluid collection is present. The internal auditory canals are within normal limits. The brainstem and cerebellum are within normal limits. Vascular: Flow is present in the major intracranial arteries. Skull and upper cervical spine: The craniocervical junction is normal. Upper cervical spine is within normal limits. Marrow signal is unremarkable. Sinuses/Orbits: A polyp or mucous retention cyst is noted in the inferior left maxillary sinus. The paranasal sinuses and mastoid air cells are otherwise clear. MRA HEAD FINDINGS Anterior circulation: Mild atherosclerotic irregularity is present in the cavernous internal carotid arteries bilaterally without significant stenosis. Moderate  proximal right A1 segment stenosis is present. Mild narrowing is present at the proximal left A1. Mm 1 segments are within normal limits bilaterally. MCA bifurcations are intact. There is some attenuation of distal MCA branch vessels bilaterally. Posterior circulation: The right vertebral artery is dominant. Fenestrated vertebrobasilar junction noted without aneurysm. Prominent AICA vessels are noted bilaterally. Basilar artery is normal. Both posterior cerebral arteries originate from basilar tip. Mild narrowing of the  superior right P3 segment noted. PCA branch vessels are otherwise within normal limits. Anatomic variants: None IMPRESSION: 1. Normal MRI appearance of the brain for age. No acute or focal lesion to explain the patient's symptoms. 2. Mild distal small vessel disease. 3. Moderate proximal right A1 segment stenosis. 4. No other significant proximal stenosis, aneurysm, or branch vessel occlusion. Electronically Signed   By: Marin Roberts M.D.   On: 10/20/2021 18:24   CT HEAD CODE STROKE WO CONTRAST  Result Date: 10/20/2021 CLINICAL DATA:  Code stroke.  Right-sided numbness EXAM: CT HEAD WITHOUT CONTRAST TECHNIQUE: Contiguous axial images were obtained from the base of the skull through the vertex without intravenous contrast. COMPARISON:  None. FINDINGS: Brain: There is no acute intracranial hemorrhage, mass effect, or edema. Gray-white differentiation is preserved. Ventricles and sulci are within normal limits in size and configuration. No extra-axial collection. Vascular: No hyperdense vessel. Skull: Unremarkable. Sinuses/Orbits: No acute abnormality. Other: Mastoid air cells are clear. ASPECTS ([place] Stroke Program Early CT Score) - Ganglionic level infarction (caudate, lentiform nuclei, internal capsule, insula, M1-M3 cortex): 7 - Supraganglionic infarction (M4-M6 cortex): 3 Total score (0-10 with 10 being normal): 10 IMPRESSION: There is no acute intracranial hemorrhage or evidence of acute infarction. ASPECT score is 10. These results were communicated to Dr. Derry Lory at 1:54 pm on 10/20/2021 by text page via the [facility] messaging system. Electronically Signed   By: Guadlupe Spanish M.D.   On: 10/20/2021 13:58   VAS US CAROTID (at [facility] and [facility] only)  Result Date: 10/21/2021 Carotid Arterial Duplex Study Patient Name:  MADOX CORKINS [facility]  Date of Exam:   10/21/2021 Medical Rec #: [id number]         Accession #:    [id number] Date of Birth: 26-Apr-1961          Patient Gender: M Patient Age:   18 years Exam  Location:  [facility] Procedure:      VAS US CAROTID Referring Phys: Orland Mustard --------------------------------------------------------------------------------  Indications:       CVA. Risk Factors:      Hypertension, current smoker. Comparison Study:  No prior studies. Performing Technologist: Collins Rosenthal RDMS, RVT  Examination Guidelines: A complete evaluation includes B-mode imaging, spectral Doppler, color Doppler, and power Doppler as needed of all accessible portions of each vessel. Bilateral testing is considered an integral part of a complete examination. Limited examinations for reoccurring indications may be performed as noted.  Right Carotid Findings: +----------+--------+--------+--------+------------------+------------------+           PSV cm/sEDV cm/sStenosisPlaque DescriptionComments           +----------+--------+--------+--------+------------------+------------------+ CCA Prox  90      29                                                   +----------+--------+--------+--------+------------------+------------------+ CCA Distal84      29                                                   +----------+--------+--------+--------+------------------+------------------+  ICA Prox  52      22                                intimal thickening +----------+--------+--------+--------+------------------+------------------+ ICA Distal94      45                                                   +----------+--------+--------+--------+------------------+------------------+ ECA       106     28                                                   +----------+--------+--------+--------+------------------+------------------+ +----------+--------+-------+----------------+-------------------+           PSV cm/sEDV cmsDescribe        Arm Pressure (mmHG) +----------+--------+-------+----------------+-------------------+ [id number]            Multiphasic, WNL                     +----------+--------+-------+----------------+-------------------+ +---------+--------+--+--------+--+---------+ VertebralPSV cm/s62EDV cm/s22Antegrade +---------+--------+--+--------+--+---------+  Left Carotid Findings: +----------+--------+--------+--------+-------------------+--------+           PSV cm/sEDV cm/sStenosisPlaque Description Comments +----------+--------+--------+--------+-------------------+--------+ CCA Prox  113     27                                          +----------+--------+--------+--------+-------------------+--------+ CCA Distal73      24                                          +----------+--------+--------+--------+-------------------+--------+ ICA Prox  58      17      1-39%   calcific and smooth         +----------+--------+--------+--------+-------------------+--------+ ICA Distal102     45                                          +----------+--------+--------+--------+-------------------+--------+ ECA       51      12                                          +----------+--------+--------+--------+-------------------+--------+ +----------+--------+--------+----------------+-------------------+           PSV cm/sEDV cm/sDescribe        Arm Pressure (mmHG) +----------+--------+--------+----------------+-------------------+ [id number]             Multiphasic, WNL                    +----------+--------+--------+----------------+-------------------+ +---------+--------+--+--------+--+---------+ VertebralPSV cm/s54EDV cm/s17Antegrade +---------+--------+--+--------+--+---------+   Summary: Right Carotid: The extracranial vessels were near-normal with only minimal wall                thickening or plaque. Left Carotid: Velocities in the left ICA are consistent with a 1-39% stenosis. Vertebrals:  Bilateral vertebral arteries demonstrate antegrade flow. Subclavians: Normal flow hemodynamics were seen in  bilateral subclavian              arteries. *See table(s) above for measurements and observations.  Electronically signed by Heath Lark on 10/21/2021 at 10:29:12 AM.    Final     PHYSICAL EXAM Pleasant male sitting at edge of bed.  Mental status/Cognition: Alert, oriented to self, place, month and year, good attention.  Speech/language: Fluent, comprehension intact, object naming intact, repetition intact.  Cranial nerves:   CN II Pupils equal and reactive to light, no VF deficits    CN III,IV,VI EOM intact, no gaze preference or deviation, no nystagmus    CN V normal sensation in V1, V2, and V3 segments bilaterally    CN VII no asymmetry, no nasolabial fold flattening    CN VIII normal hearing to speech    CN IX & X normal palatal elevation, no uvular deviation    CN XI 5/5 head turn and 5/5 shoulder shrug bilaterally    CN XII midline tongue protrusion     Motor:  Muscle bulk: normal, tone normal, pronator drift none tremor none. Strength 5/5    Coordination/Complex Motor:  - Finger to Nose intact bilaterally - Heel to shin intact bilaterally - Rapid alternating movement are normal   ASSESSMENT/PLAN: [name] is a 60 y.o. male with history of   hypertension, psoriasis, right lumbar radiculopathy, smoker who presents with an acute onset episode of right facial and right arm numbness with a last known well of 1130 on 10/12/2021.  Patient had just finished his lunch when the symptoms started abruptly.  The symptoms persisted so he came into the emergency department.  He reports that he was at a pond shop when he started having the symptoms.  He also reports that last week he had an episode of pain in his shoulders with paresthesias in his arms.   TIA, likely related to stroke risk factors - cocaine abuse, smoker and uncontrolled HTN    Code Stroke  CT head No acute abnormality.  Small vessel disease. Atrophy.  ASPECTS 10.     MRI  BRAIN: 1. Normal MRI appearance of the  brain for age. No acute or focal lesion to explain the patient's symptoms. MRA head  Mild distal small vessel disease. 3. Moderate proximal right A1 segment stenosis. 4. No other significant proximal stenosis, aneurysm, or branch vessel occlusion. Carotid Doppler   left ICA 1-39% stenosis otherwise unremarkable  2D Echo EF 60-65% LDL 80 HgbA1c 5.4 VTE prophylaxis - scd     Diet   Diet Heart Room service appropriate? Yes; Fluid consistency: Thin   Aspirin with caffeine  prior to admission, now on aspirin 81 mg daily and clopidogrel 75 mg daily. For 3 weeks then resume aspirin 81mg  daily. Therapy recommendations:  home with no follow up.  Disposition:  home   Hypertension Home meds:  amlodipine High on presentation  Now Stable Permissive hypertension (OK if < 220/120) but gradually normalize in 5-7 days Long-term BP goal normotensive  Hyperlipidemia Home meds:  none,    LDL 80, goal < 70 Add lipitor 40mg   Continue statin at discharge  Other Stroke Risk Factors    Cigarette smoker advised to stop smoking  Substance abuse - UDS:  THC POSITIVE, Cocaine POSITIVE. Patient advised to stop using due to stroke risk.    Other Active Problems  Elevated troponin -initial troponin at 43 delta pending  he also has ST elevation on EKG however he has no complaints of chest pain. ED discussed with cardiology on-call who stated they thought EKG changes were due to hypertensive urgency however if delta troponin continues to trend up we will officially consult cardiology -Placed on telemetry  Substance abuse: encouraged to quit.   Tobacco abuse -nicotine patch, encouraged to quit.      Arthritis  Likely has psoriatic arthritis. Currently on no medication. F/u outpatient.    Mood disorder (HCC) -currently on no medication. Was on zyprexa and thought he was supposed to stop when it ran out. Stable -psychiatry has been consulted and will see tomorrow     Hospital day # 0     To  contact Stroke Continuity provider, please refer to [email or web address]. After hours, contact General Neurology

## 2021-10-21 NOTE — Consult Note (Signed)
[facility] Face-to-Face Psychiatry Consult   Reason for Consult:  Referring Physician:'' Tells me he has both bipolar and schizophrenia. Has Zyprexa in chart, but not taking. No PCP or psychiatrist Patient Identification: Randy Collins MRN:  [id number] Principal Diagnosis: Other bipolar disorder (HCC) Diagnosis:  Principal Problem:   Other bipolar disorder (HCC) Active Problems:   Arthritis   Right lumbar radiculopathy   Hypertensive urgency   Tobacco abuse   Right arm numbness   Elevated troponin   Total Time spent with patient: 1 hour  Subjective:   Randy Collins is a 60 y.o. male patient admitted with right facial and right arm weakness.  HPI:  60 year old male with history of hypertension, osteoarthritis, psoriasis, tobacco, Bipolar disorder and Cannabis/Cocaine abuse. Patient reports that he was admitted to the hospital; due to elevated blood pressure, right facial and right arm weakness. He denies drug use but states that he was celebrating the [employer] homecoming party with a couple of friends,admits that they smoke some weeds but think it is laced with cocaine. He states that he lives with his mother whom he takes care of and he is currently on disability for medical and mental reason. However, reports that he does not currently sees a psychiatrist, he was receiving care at [facility] outpatient but claims he was discharged in  2019 after he was deemed stable. He states that his major concern now is getting help with his social needs(housing) and chronic pain secondary to lumbar radiculopathy. Patient is calm, pleasant, denies mood swings, depression, anxiety, psychosis, delusions and self harming thoughts. Past Psychiatric History: as above  Risk to Self:  denies Risk to Others:  denies Prior Inpatient Therapy:   Prior Outpatient Therapy:    Past Medical History:  Past Medical History:  Diagnosis Date   Arthritis 2009   likely psoriatic arthritis--inflammatory, but not symmetric, left  hip and back, right elbow, both knees   Hypertension 2014   Psoriasis 2009   Right lumbar radiculopathy 2019   No past surgical history on file. Family History:  Family History  Problem Relation Age of Onset   Hypertension Mother    Arthritis Mother    Gout Mother    Hyperlipidemia Mother    Gout Father    Alcohol abuse Father        probable cirrhosis   Heart disease Brother 73       AMI   HIV/AIDS Brother    Cancer Brother        not sure what primary   Family Psychiatric  History:   Social History:  Social History   Substance and Sexual Activity  Alcohol Use Yes   Comment: History of abuse.  Occasional beer now.     Social History   Substance and Sexual Activity  Drug Use Not Currently   Comment: history of speed, MJ, Acid.  Last time use was 1999    Social History   Socioeconomic History   Marital status: Single    Spouse name: Not on file   Number of children: 1   Years of education: Not on file   Highest education level: 9th grade  Occupational History   Not on file  Tobacco Use   Smoking status: Every Day    Packs/day: 1.00    Types: Cigarettes   Smokeless tobacco: Never   Tobacco comments:    [facility]  Vaping Use   Vaping Use: Never used  Substance and Sexual Activity  Alcohol use: Yes    Comment: History of abuse.  Occasional beer now.   Drug use: Not Currently    Comment: history of speed, MJ, Acid.  Last time use was 1999   Sexual activity: Not Currently    Comment: "retired" Has ED  Other Topics Concern   Not on file  Social History Narrative   Not on file   Social Determinants of Health   Financial Resource Strain: Not on file  Food Insecurity: Not on file  Transportation Needs: Not on file  Physical Activity: Not on file  Stress: Not on file  Social Connections: Not on file   Additional Social History:    Allergies:  No Known Allergies  Labs:  Results for orders placed or performed during the  hospital encounter of 10/20/21 (from the past 48 hour(s))  Troponin I (High Sensitivity)     Status: Abnormal   Collection Time: 10/20/21  1:27 PM  Result Value Ref Range   Troponin I (High Sensitivity) 43 (H) <18 ng/L    Comment: (NOTE) Elevated high sensitivity troponin I (hsTnI) values and significant  changes across serial measurements may suggest ACS but many other  chronic and acute conditions are known to elevate hsTnI results.  Refer to the "Links" section for chest pain algorithms and additional  guidance. Performed at  [facility], [address], [place], [place] [address]   CBC with Differential/Platelet     Status: None   Collection Time: 10/20/21  1:27 PM  Result Value Ref Range   WBC 7.3 4.0 - 10.5 K/uL   RBC 5.17 4.22 - 5.81 MIL/uL   Hemoglobin 15.5 13.0 - 17.0 g/dL   HCT 47.6 54.6 - 50.3 %   MCV 92.8 80.0 - 100.0 fL   MCH 30.0 26.0 - 34.0 pg   MCHC 32.3 30.0 - 36.0 g/dL   RDW 54.6 56.8 - 12.7 %   Platelets 340 150 - 400 K/uL   nRBC 0.0 0.0 - 0.2 %   Neutrophils Relative % 67 %   Neutro Abs 5.0 1.7 - 7.7 K/uL   Lymphocytes Relative 22 %   Lymphs Abs 1.6 0.7 - 4.0 K/uL   Monocytes Relative 9 %   Monocytes Absolute 0.6 0.1 - 1.0 K/uL   Eosinophils Relative 1 %   Eosinophils Absolute 0.1 0.0 - 0.5 K/uL   Basophils Relative 1 %   Basophils Absolute 0.1 0.0 - 0.1 K/uL   Immature Granulocytes 0 %   Abs Immature Granulocytes 0.03 0.00 - 0.07 K/uL    Comment: Performed at [facility], [address], [place], [place] [address]  Ethanol     Status: None   Collection Time: 10/20/21  1:38 PM  Result Value Ref Range   Alcohol, Ethyl (B) <10 <10 mg/dL    Comment: (NOTE) Lowest detectable limit for serum alcohol is 10 mg/dL.  For medical purposes only. Performed at [facility], [address] [place], [place] [address]   Protime-INR     Status: None   Collection Time: 10/20/21  1:38 PM  Result Value Ref Range   Prothrombin Time 12.6 11.4 -  15.2 seconds   INR 0.9 0.8 - 1.2    Comment: (NOTE) INR goal varies based on device and disease states. Performed at [facility], [address], [place], [place] [address]   APTT     Status: None   Collection Time: 10/20/21  1:38 PM  Result Value  Ref Range   aPTT 25 24 - 36 seconds    Comment: Performed at [facility], [address], [place], [place] [address]  Comprehensive metabolic panel     Status: Abnormal   Collection Time: 10/20/21  1:38 PM  Result Value Ref Range   Sodium 138 135 - 145 mmol/L   Potassium 4.7 3.5 - 5.1 mmol/L   Chloride 102 98 - 111 mmol/L   CO2 30 22 - 32 mmol/L   Glucose, Bld 99 70 - 99 mg/dL    Comment: Glucose reference range applies only to samples taken after fasting for at least 8 hours.   BUN 15 8 - 23 mg/dL   Creatinine, Ser 5.28 (H) 0.61 - 1.24 mg/dL   Calcium 9.0 8.9 - 41.3 mg/dL   Total Protein 6.0 (L) 6.5 - 8.1 g/dL   Albumin 3.4 (L) 3.5 - 5.0 g/dL   AST 40 15 - 41 U/L   ALT 20 0 - 44 U/L   Alkaline Phosphatase 60 38 - 126 U/L   Total Bilirubin 0.9 0.3 - 1.2 mg/dL   GFR, Estimated 60 (L) >60 mL/min    Comment: (NOTE) Calculated using the CKD-EPI Creatinine Equation (2021)    Anion gap 6 5 - 15    Comment: Performed at [facility], [address], [place], [place] [address]  Urine rapid drug screen (hosp performed)     Status: Abnormal   Collection Time: 10/20/21  1:38 PM  Result Value Ref Range   Opiates NONE DETECTED NONE DETECTED   Cocaine POSITIVE (A) NONE DETECTED   Benzodiazepines NONE DETECTED NONE DETECTED   Amphetamines NONE DETECTED NONE DETECTED   Tetrahydrocannabinol POSITIVE (A) NONE DETECTED   Barbiturates NONE DETECTED NONE DETECTED    Comment: (NOTE) DRUG SCREEN FOR MEDICAL PURPOSES ONLY.  IF CONFIRMATION IS NEEDED FOR ANY PURPOSE, NOTIFY LAB WITHIN 5 DAYS.  LOWEST DETECTABLE LIMITS FOR URINE DRUG SCREEN Drug Class                     Cutoff (ng/mL) Amphetamine and metabolites     1000 Barbiturate and metabolites    200 Benzodiazepine                 200 Tricyclics and metabolites     300 Opiates and metabolites        300 Cocaine and metabolites        300 THC                            50 Performed at [facility], [address] [place], [place] [address]   Urinalysis, Routine w reflex microscopic Urine, Clean Catch     Status: Abnormal   Collection Time: 10/20/21  1:38 PM  Result Value Ref Range   Color, Urine YELLOW YELLOW   APPearance CLEAR CLEAR   Specific Gravity, Urine 1.011 1.005 - 1.030   pH 6.0 5.0 - 8.0   Glucose, UA NEGATIVE NEGATIVE mg/dL   Hgb urine dipstick SMALL (A) NEGATIVE   Bilirubin Urine NEGATIVE NEGATIVE   Ketones, ur NEGATIVE NEGATIVE mg/dL   Protein, ur NEGATIVE NEGATIVE mg/dL   Nitrite NEGATIVE NEGATIVE   Leukocytes,Ua NEGATIVE NEGATIVE   RBC / HPF 0-5 0 - 5 RBC/hpf   WBC, UA 0-5 0 - 5 WBC/hpf   Bacteria, UA NONE SEEN NONE SEEN   Squamous Epithelial / LPF 0-5 0 -  5    Comment: Performed at [facility], [address], [place], [place] [address]  I-stat chem 8, ED     Status: Abnormal   Collection Time: 10/20/21  1:45 PM  Result Value Ref Range   Sodium 139 135 - 145 mmol/L   Potassium 4.7 3.5 - 5.1 mmol/L   Chloride 103 98 - 111 mmol/L   BUN 19 8 - 23 mg/dL   Creatinine, Ser 6.04 (H) 0.61 - 1.24 mg/dL   Glucose, Bld 94 70 - 99 mg/dL    Comment: Glucose reference range applies only to samples taken after fasting for at least 8 hours.   Calcium, Ion 1.15 1.15 - 1.40 mmol/L   TCO2 28 22 - 32 mmol/L   Hemoglobin 16.7 13.0 - 17.0 g/dL   HCT 54.0 98.1 - 19.1 %  Resp Panel by RT-PCR (Flu A&B, Covid) Nasopharyngeal Swab     Status: None   Collection Time: 10/20/21  2:23 PM   Specimen: Nasopharyngeal Swab; Nasopharyngeal(NP) swabs in vial transport medium  Result Value Ref Range   SARS Coronavirus 2 by RT PCR NEGATIVE NEGATIVE    Comment: (NOTE) SARS-CoV-2 target nucleic acids are NOT DETECTED.  The SARS-CoV-2  RNA is generally detectable in upper respiratory specimens during the acute phase of infection. The lowest concentration of SARS-CoV-2 viral copies this assay can detect is 138 copies/mL. A negative result does not preclude SARS-Cov-2 infection and should not be used as the sole basis for treatment or other patient management decisions. A negative result may occur with  improper specimen collection/handling, submission of specimen other than nasopharyngeal swab, presence of viral mutation(s) within the areas targeted by this assay, and inadequate number of viral copies(<138 copies/mL). A negative result must be combined with clinical observations, patient history, and epidemiological information. The expected result is Negative.  Fact Sheet for Patients:  BloggerCourse.com  Fact Sheet for Healthcare Providers:  SeriousBroker.it  This test is no t yet approved or cleared by the [place] FDA and  has been authorized for detection and/or diagnosis of SARS-CoV-2 by FDA under an Emergency Use Authorization (EUA). This EUA will remain  in effect (meaning this test can be used) for the duration of the COVID-19 declaration under Section 564(b)(1) of the Act, 21 U.S.C.section 360bbb-3(b)(1), unless the authorization is terminated  or revoked sooner.       Influenza A by PCR NEGATIVE NEGATIVE   Influenza B by PCR NEGATIVE NEGATIVE    Comment: (NOTE) The Xpert Xpress SARS-CoV-2/FLU/RSV plus assay is intended as an aid in the diagnosis of influenza from Nasopharyngeal swab specimens and should not be used as a sole basis for treatment. Nasal washings and aspirates are unacceptable for Xpert Xpress SARS-CoV-2/FLU/RSV testing.  Fact Sheet for Patients: BloggerCourse.com  Fact Sheet for Healthcare Providers: SeriousBroker.it  This test is not yet approved or cleared by the [place]  FDA and has been authorized for detection and/or diagnosis of SARS-CoV-2 by FDA under an Emergency Use Authorization (EUA). This EUA will remain in effect (meaning this test can be used) for the duration of the COVID-19 declaration under Section 564(b)(1) of the Act, 21 U.S.C. section 360bbb-3(b)(1), unless the authorization is terminated or revoked.  Performed at [facility], [address] [place], [place] [address]   Troponin I (High Sensitivity)     Status: Abnormal   Collection Time: 10/20/21  4:50 PM  Result Value Ref Range   Troponin I (High Sensitivity) 38 (H) <  18 ng/L    Comment: (NOTE) Elevated high sensitivity troponin I (hsTnI) values and significant  changes across serial measurements may suggest ACS but many other  chronic and acute conditions are known to elevate hsTnI results.  Refer to the "Links" section for chest pain algorithms and additional  guidance. Performed at [facility], [address] [place], [place] [address]   HIV Antibody (routine testing w rflx)     Status: None   Collection Time: 10/20/21  4:50 PM  Result Value Ref Range   HIV Screen 4th Generation wRfx Non Reactive Non Reactive    Comment: Performed at [facility], [address] [place], [place] [address]  Hemoglobin A1c     Status: None   Collection Time: 10/21/21  6:23 AM  Result Value Ref Range   Hgb A1c MFr Bld 5.4 4.8 - 5.6 %    Comment: (NOTE) Pre diabetes:          5.7%-6.4%  Diabetes:              >6.4%  Glycemic control for   <7.0% adults with diabetes    Mean Plasma Glucose 108.28 mg/dL    Comment: Performed at [facility], [address] [place], [place] [address]  Lipid panel     Status: None   Collection Time: 10/21/21  6:23 AM  Result Value Ref Range   Cholesterol 163 0 - 200 mg/dL   Triglycerides 65 <383 mg/dL   HDL 70 >29 mg/dL   Total CHOL/HDL Ratio 2.3 RATIO   VLDL 13 0 - 40 mg/dL   LDL Cholesterol 80 0 - 99 mg/dL    Comment:         Total Cholesterol/HDL:CHD Risk Coronary Heart Disease Risk Table                     Men   Women  1/2 Average Risk   3.4   3.3  Average Risk       5.0   4.4  2 X Average Risk   9.6   7.1  3 X Average Risk  23.4   11.0        Use the calculated Patient Ratio above and the CHD Risk Table to determine the patient's CHD Risk.        ATP III CLASSIFICATION (LDL):  <100     mg/dL   Optimal  191-660  mg/dL   Near or Above                    Optimal  130-159  mg/dL   Borderline  600-459  mg/dL   High  >977     mg/dL   Very High Performed at [facility], [address] [place], [place] [address]     Current Facility-Administered Medications  Medication Dose Route Frequency Provider Last Rate Last Admin   acetaminophen (TYLENOL) tablet 650 mg  650 mg Oral Q4H PRN Orland Mustard, MD   650 mg at 10/20/21 1955   Or   acetaminophen (TYLENOL) 160 MG/5ML solution 650 mg  650 mg Per Tube Q4H PRN Orland Mustard, MD       Or   acetaminophen (TYLENOL) suppository 650 mg  650 mg Rectal Q4H PRN Orland Mustard, MD       aspirin EC tablet 81 mg  81 mg Oral Daily Orland Mustard, MD   81 mg at 10/21/21 [id number]  atorvastatin (LIPITOR) tablet 40 mg  40 mg Oral Daily Marvel Plan, MD   40 mg at 10/21/21 0920   clopidogrel (PLAVIX) tablet 75 mg  75 mg Oral Daily Orland Mustard, MD   75 mg at 10/21/21 0920   cyclobenzaprine (FLEXERIL) tablet 5 mg  5 mg Oral TID PRN Orland Mustard, MD   5 mg at 10/21/21 1115   enoxaparin (LOVENOX) injection 40 mg  40 mg Subcutaneous Q24H Orland Mustard, MD   40 mg at 10/20/21 1642   fluticasone (FLONASE) 50 MCG/ACT nasal spray 1 spray  1 spray Each Nare Daily PRN Orland Mustard, MD       hydrALAZINE (APRESOLINE) injection 10 mg  10 mg Intravenous Q6H PRN Orland Mustard, MD       nicotine (NICODERM CQ - dosed in mg/24 hours) patch 14 mg  14 mg Transdermal Q24H Orland Mustard, MD   14 mg at 10/20/21 1642   senna-docusate (Senokot-S) tablet 1 tablet  1 tablet Oral QHS  PRN Orland Mustard, MD       traMADol Janean Sark) tablet 50 mg  50 mg Oral Q6H PRN Orland Mustard, MD   50 mg at 10/21/21 1115    Musculoskeletal: Strength & Muscle Tone: within normal limits Gait & Station: normal Patient leans: N/A      Psychiatric Specialty Exam:  Presentation  General Appearance: Appropriate for Environment  Eye Contact:Good  Speech:Clear and Coherent  Speech Volume:Normal  Handedness:Right   Mood and Affect  Mood:Euthymic  Affect:Appropriate   Thought Process  Thought Processes:Coherent; Linear  Descriptions of Associations:Intact  Orientation:None  Thought Content:Logical  History of Schizophrenia/Schizoaffective disorder:No data recorded Duration of Psychotic Symptoms:No data recorded Hallucinations:Hallucinations: None  Ideas of Reference:None  Suicidal Thoughts:Suicidal Thoughts: No  Homicidal Thoughts:Homicidal Thoughts: No   Sensorium  Memory:Immediate Good; Recent Good; Remote Good  Judgment:Fair  Insight:Fair   Executive Functions  Concentration:Good  Attention Span:Good  Recall:Good  Fund of Knowledge:Good  Language:Good   Psychomotor Activity  Psychomotor Activity:Psychomotor Activity: Normal   Assets  Assets:Communication Skills; Desire for Improvement   Sleep  Sleep:Sleep: Fair   Physical Exam: Physical Exam ROS Blood pressure (!) 147/95, pulse 60, temperature 97.8 F (36.6 C), temperature source Axillary, resp. rate 19, height 5\' 9"  (1.753 m), weight 66.4 kg, SpO2 96 %. Body mass index is 21.62 kg/m.  Treatment Plan Summary: 60 year old with history of mental disorder-currently stable , hypertension and chronic pain secondary to Radiculopathy for which he is seeking help. He denies any acute mental illness symptoms and will appreciate a referral to social worker for social needs and appropriate provider for Radiculopathy. Patient states that he will consider talking to mental health provider  in future if necessary. He does not need to re-start Zyprexa since he has been mentally stable without taking any medication.  Recommendation:  -Consider social worker consult to address patient social needs    Disposition: No evidence of imminent risk to self or others at present.   Patient does not meet criteria for psychiatric inpatient admission. Supportive therapy provided about ongoing stressors. Psychiatric service signing off. Re-consult as needed  77, MD 10/21/2021 1:59 PM

## 2021-10-21 NOTE — Evaluation (Signed)
Occupational Therapy Evaluation Patient Details Name: Randy Collins MRN: [id number] DOB: [birth year] Today's Date: 10/21/2021   History of Present Illness Patient noticed he was hypertensive yesterday. He took a walk to try to bring it down and began to notice right facial droop and right UE weakness. His symptoms have resolved but now he continues to have mild syncope and numbness around his left eye. His MRI showed mild small vessel ischemic changes. PMH: Arthritis ( unspecified right lumb ar radiculopathy, HTN,   Clinical Impression   Pt admitted for concerns listed above. PTA pt reported that he was independent with all ADL's and IADL's, including taking care of his mom. Pt demonstrating no weakness this session and overall, pt balance remains intact. Pt's vision appears mildly blurred with a slight visual field cut in the R upper quadrant. Pt educated on being more aware of his right side due to visual deficit, and to follow up with an eye doctor if the worsening in visual acuity continues. Pt has no further OT needs at this time and acute OT will sign off.      Recommendations for follow up therapy are one component of a multi-disciplinary discharge planning process, led by the attending physician.  Recommendations may be updated based on patient status, additional functional criteria and insurance authorization.   Follow Up Recommendations  No OT follow up    Assistance Recommended at Discharge None  Functional Status Assessment  Patient has had a recent decline in their functional status and demonstrates the ability to make significant improvements in function in a reasonable and predictable amount of time.  Equipment Recommendations  None recommended by OT    Recommendations for Other Services       Precautions / Restrictions Precautions Precautions: None Restrictions Weight Bearing Restrictions: No      Mobility Bed Mobility Overal bed mobility: Independent              General bed mobility comments: sat up at the edge of the bed without assistance    Transfers Overall transfer level: Modified independent Equipment used: None Transfers: Sit to/from Stand Sit to Stand: Modified independent (Device/Increase time)           General transfer comment: No difficulties or safety concerns, reminded pt to take it slow.      Balance Overall balance assessment: Needs assistance Sitting-balance support: No upper extremity supported Sitting balance-Leahy Scale: Good     Standing balance support: No upper extremity supported Standing balance-Leahy Scale: Fair Standing balance comment: needed supervision                           ADL either performed or assessed with clinical judgement   ADL Overall ADL's : At baseline                                       General ADL Comments: Pt able to complete toileting and simulate bathing with no difficulties. ROM and strength WFL, no concerns     Vision Baseline Vision/History: 1 Wears glasses Ability to See in Adequate Light: 0 Adequate Patient Visual Report: Other (comment) (Numbnesss around R eye) Vision Assessment?: Yes Eye Alignment: Within Functional Limits Ocular Range of Motion: Within Functional Limits Alignment/Gaze Preference: Within Defined Limits Tracking/Visual Pursuits: Able to track stimulus in all quads without difficulty Visual Fields: Right visual field deficit (Mildly  in R upper quadrant) Additional Comments: Pt eyes fatigue/blur very quickly, especially R eye. Able to see larger print     Perception     Praxis      Pertinent Vitals/Pain Pain Assessment: Faces Faces Pain Scale: Hurts little more Pain Location: right LE Pain Descriptors / Indicators: Aching Pain Intervention(s): Monitored during session;Repositioned     Hand Dominance Right   Extremity/Trunk Assessment Upper Extremity Assessment Upper Extremity Assessment: Overall WFL for  tasks assessed   Lower Extremity Assessment Lower Extremity Assessment: Defer to PT evaluation RLE Deficits / Details: Right hip flexor weakness. he reports this is baseline   Cervical / Trunk Assessment Cervical / Trunk Assessment: Normal   Communication Communication Communication: No difficulties   Cognition Arousal/Alertness: Awake/alert Behavior During Therapy: WFL for tasks assessed/performed Overall Cognitive Status: Within Functional Limits for tasks assessed                                       General Comments  Mild R visual deficits noted. BP 166/96 on arrival. Left pt on the phone with his family.    Exercises     Shoulder Instructions      Home Living Family/patient expects to be discharged to:: Private residence Living Arrangements: Parent Available Help at Discharge: Family Type of Home: House Home Access: Level entry     Home Layout: One level     Bathroom Shower/Tub: Chief Strategy Officer: Standard     Home Equipment: Agricultural consultant (2 wheels);Cane - single point   Additional Comments: Patient takes care of his mom during the day.      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 He was ambulatory and active ADLs Comments: No difficulties        OT Problem List: Decreased strength;Decreased activity tolerance;Impaired balance (sitting and/or standing);Impaired vision/perception;Decreased safety awareness      OT Treatment/Interventions:      OT Goals(Current goals can be found in the care plan section) Acute Rehab OT Goals Patient Stated Goal: To go home OT Goal Formulation: All assessment and education complete, DC therapy Time For Goal Achievement: 10/21/21 Potential to Achieve Goals: Good  OT Frequency:     Barriers to D/C:            Co-evaluation              AM-PAC OT  "6 Clicks" Daily Activity     Outcome Measure Help from another person eating meals?: None Help from another person taking care of personal grooming?: None Help from another person toileting, which includes using toliet, bedpan, or urinal?: None Help from another person bathing (including washing, rinsing, drying)?: None Help from another person to put on and taking off regular upper body clothing?: None Help from another person to put on and taking off regular lower body clothing?: None 6 Click Score: 24   End of Session Equipment Utilized During Treatment: Gait belt Nurse Communication: Mobility status  Activity Tolerance: Patient tolerated treatment well Patient left: in bed;with call bell/phone within reach  OT Visit Diagnosis: Unsteadiness on feet (R26.81);Other abnormalities of gait and mobility (R26.89);Muscle weakness (generalized) (M62.81)                Time: 3893-7342 OT Time Calculation (min):  21 min Charges:  OT General Charges $OT Visit: 1 Visit OT Evaluation $OT Eval Low Complexity: 1 Low  Ariani Seier H., OTR/L Acute Rehabilitation  Lonzo Saulter Elane Daritza Brees 10/21/2021, 11:07 AM

## 2021-10-21 NOTE — Progress Notes (Addendum)
PROGRESS NOTE    Randy Collins  NOI:[id number] DOB: March 02, 1961 DOA: 10/20/2021 PCP: Julieanne Manson, MD  Brief Narrative: 60/M with history of hypertension, osteoarthritis, psoriasis, tobacco and polysubstance abuse presented to the ED with right facial and right arm weakness yesterday.  His BP was around 220 systolic with EMS.In the emergency room his blood pressure was in the 160s, labs were notable for creatinine of 1.3, CT head was unremarkable, neurology was consulted and he was admitted for TIA/stroke work-up UDS positive for cocaine, cannabis   Assessment & Plan:   Right facial numbness, arm weakness Hypertensive urgency Cocaine use -MRI negative for CVA, symptoms improving but have not resolved -I suspect this is secondary to hypertensive crisis, worsened by cocaine use -Await neurology input today -LDL is 80, hemoglobin A1c is 5.4 -2D echo done results pending -PT OT eval today  Accelerated hypertension -Not on any meds, has not seen a doctor in over 4 years -Add amlodipine -TOC consult for PCP  History of mood disorder -Previously on Zyprexa, has not been on meds for years, psychiatry was consulted by admitting MD yesterday  Cocaine Cannabis use disorder -Counseled  Tobacco abuse -Counseled, nicotine patch  DVT prophylaxis: Lovenox Code Status: Full code Family Communication: No family at bedside Disposition Plan:  Status is: Observation    Consultants:  Neurology  Procedures:   Antimicrobials:    Subjective: -Feels better today, some aches and pains reported  Objective: Vitals:   10/21/21 0222 10/21/21 0317 10/21/21 0428 10/21/21 0714  BP: (!) 145/91 (!) 155/115 (!) 144/77 (!) 156/96  Pulse: (!) 52 (!) 57 (!) 58 60  Resp: 15 19  14   Temp: (!) 97.4 F (36.3 C) (!) 97.5 F (36.4 C) (!) 97.4 F (36.3 C) 97.8 F (36.6 C)  TempSrc: Axillary Oral Axillary Axillary  SpO2: 98% 96%  96%  Weight:       No intake or output data in the 24  hours ending 10/21/21 0940 Filed Weights   10/20/21 1325  Weight: 66.4 kg    Examination:  General exam: Appears calm and comfortable  Respiratory system: Clear to auscultation Cardiovascular system: S1 & S2 heard, RRR.  Abd: nondistended, soft and nontender.Normal bowel sounds heard. Central nervous system: Alert and oriented.  Mild right facial numbness and arm weakness Extremities: no edema Skin: No rashes Psychiatry: Judgement and insight appear normal. Mood & affect appropriate.     Data Reviewed:   CBC: Recent Labs  Lab 10/20/21 1327 10/20/21 1345  WBC 7.3  --   NEUTROABS 5.0  --   HGB 15.5 16.7  HCT 48.0 49.0  MCV 92.8  --   PLT 340  --    Basic Metabolic Panel: Recent Labs  Lab 10/20/21 1338 10/20/21 1345  NA 138 139  K 4.7 4.7  CL 102 103  CO2 30  --   GLUCOSE 99 94  BUN 15 19  CREATININE 1.35* 1.30*  CALCIUM 9.0  --    GFR: CrCl cannot be calculated (Unknown ideal weight.). Liver Function Tests: Recent Labs  Lab 10/20/21 1338  AST 40  ALT 20  ALKPHOS 60  BILITOT 0.9  PROT 6.0*  ALBUMIN 3.4*   No results for input(s): LIPASE, AMYLASE in the last 168 hours. No results for input(s): AMMONIA in the last 168 hours. Coagulation Profile: Recent Labs  Lab 10/20/21 1338  INR 0.9   Cardiac Enzymes: No results for input(s): CKTOTAL, CKMB, CKMBINDEX, TROPONINI in the last 168 hours. BNP (last 3  results) No results for input(s): PROBNP in the last 8760 hours. HbA1C: Recent Labs    10/21/21 0623  HGBA1C 5.4   CBG: No results for input(s): GLUCAP in the last 168 hours. Lipid Profile: Recent Labs    10/21/21 0623  CHOL 163  HDL 70  LDLCALC 80  TRIG 65  CHOLHDL 2.3   Thyroid Function Tests: No results for input(s): TSH, T4TOTAL, FREET4, T3FREE, THYROIDAB in the last 72 hours. Anemia Panel: No results for input(s): VITAMINB12, FOLATE, FERRITIN, TIBC, IRON, RETICCTPCT in the last 72 hours. Urine analysis:    Component Value  Date/Time   COLORURINE YELLOW 10/20/2021 1338   APPEARANCEUR CLEAR 10/20/2021 1338   LABSPEC 1.011 10/20/2021 1338   PHURINE 6.0 10/20/2021 1338   GLUCOSEU NEGATIVE 10/20/2021 1338   HGBUR SMALL (A) 10/20/2021 1338   BILIRUBINUR NEGATIVE 10/20/2021 1338   KETONESUR NEGATIVE 10/20/2021 1338   PROTEINUR NEGATIVE 10/20/2021 1338   NITRITE NEGATIVE 10/20/2021 1338   LEUKOCYTESUR NEGATIVE 10/20/2021 1338   Sepsis Labs: @LABRCNTIP (procalcitonin:4,lacticidven:4)  ) Recent Results (from the past 240 hour(s))  Resp Panel by RT-PCR (Flu A&B, Covid) Nasopharyngeal Swab     Status: None   Collection Time: 10/20/21  2:23 PM   Specimen: Nasopharyngeal Swab; Nasopharyngeal(NP) swabs in vial transport medium  Result Value Ref Range Status   SARS Coronavirus 2 by RT PCR NEGATIVE NEGATIVE Final    Comment: (NOTE) SARS-CoV-2 target nucleic acids are NOT DETECTED.  The SARS-CoV-2 RNA is generally detectable in upper respiratory specimens during the acute phase of infection. The lowest concentration of SARS-CoV-2 viral copies this assay can detect is 138 copies/mL. A negative result does not preclude SARS-Cov-2 infection and should not be used as the sole basis for treatment or other patient management decisions. A negative result may occur with  improper specimen collection/handling, submission of specimen other than nasopharyngeal swab, presence of viral mutation(s) within the areas targeted by this assay, and inadequate number of viral copies(<138 copies/mL). A negative result must be combined with clinical observations, patient history, and epidemiological information. The expected result is Negative.  Fact Sheet for Patients:  10/22/21  Fact Sheet for Healthcare Providers:  BloggerCourse.com  This test is no t yet approved or cleared by the [employer] FDA and  has been authorized for detection and/or diagnosis of SARS-CoV-2  by FDA under an Emergency Use Authorization (EUA). This EUA will remain  in effect (meaning this test can be used) for the duration of the COVID-19 declaration under Section 564(b)(1) of the Act, 21 U.S.C.section 360bbb-3(b)(1), unless the authorization is terminated  or revoked sooner.       Influenza A by PCR NEGATIVE NEGATIVE Final   Influenza B by PCR NEGATIVE NEGATIVE Final    Comment: (NOTE) The Xpert Xpress SARS-CoV-2/FLU/RSV plus assay is intended as an aid in the diagnosis of influenza from Nasopharyngeal swab specimens and should not be used as a sole basis for treatment. Nasal washings and aspirates are unacceptable for Xpert Xpress SARS-CoV-2/FLU/RSV testing.  Fact Sheet for Patients: [place]  Fact Sheet for Healthcare Providers: BloggerCourse.com  This test is not yet approved or cleared by the [employer]ious[employer] FDA and has been authorized for detection and/or diagnosis of SARS-CoV-2 by FDA under an Emergency Use Authorization (EUA). This EUA will remain in effect (meaning this test can be used) for the duration of the COVID-19 declaration under Section 564(b)(1) of the Act, 21 U.S.C. section 360bbb-3(b)(1), unless the authorization is terminated or revoked.  Performed at [facility]  [facility], [address] [place], [place] [address]          Radiology Studies: DG Chest 2 View  Result Date: 10/20/2021 CLINICAL DATA:  Chest pain EXAM: CHEST - 2 VIEW COMPARISON:  07/30/2010 FINDINGS: The heart size and mediastinal contours are within normal limits. Both lungs are clear. The visualized skeletal structures are unremarkable. IMPRESSION: No acute abnormality of the lungs. Electronically Signed   By: Jearld Lesch M.D.   On: 10/20/2021 14:12   MR ANGIO HEAD WO CONTRAST  Result Date: 10/20/2021 CLINICAL DATA:  Right-sided facial and upper extremity numbness. Bilateral upper extremity weakness. EXAM: MRI HEAD  WITHOUT CONTRAST MRA HEAD WITHOUT CONTRAST TECHNIQUE: Multiplanar, multi-echo pulse sequences of the brain and surrounding structures were acquired without intravenous contrast. Angiographic images of the Circle of Willis were acquired using MRA technique without intravenous contrast. COMPARISON:  CT head without contrast 10/20/2021 FINDINGS: MRI HEAD FINDINGS Brain: Mild atrophy and white matter changes are likely within normal limits for age. No acute infarct, hemorrhage, or mass lesion is present. The ventricles are of normal size. No significant extraaxial fluid collection is present. The internal auditory canals are within normal limits. The brainstem and cerebellum are within normal limits. Vascular: Flow is present in the major intracranial arteries. Skull and upper cervical spine: The craniocervical junction is normal. Upper cervical spine is within normal limits. Marrow signal is unremarkable. Sinuses/Orbits: A polyp or mucous retention cyst is noted in the inferior left maxillary sinus. The paranasal sinuses and mastoid air cells are otherwise clear. MRA HEAD FINDINGS Anterior circulation: Mild atherosclerotic irregularity is present in the cavernous internal carotid arteries bilaterally without significant stenosis. Moderate proximal right A1 segment stenosis is present. Mild narrowing is present at the proximal left A1. Mm 1 segments are within normal limits bilaterally. MCA bifurcations are intact. There is some attenuation of distal MCA branch vessels bilaterally. Posterior circulation: The right vertebral artery is dominant. Fenestrated vertebrobasilar junction noted without aneurysm. Prominent AICA vessels are noted bilaterally. Basilar artery is normal. Both posterior cerebral arteries originate from basilar tip. Mild narrowing of the superior right P3 segment noted. PCA branch vessels are otherwise within normal limits. Anatomic variants: None IMPRESSION: 1. Normal MRI appearance of the brain for  age. No acute or focal lesion to explain the patient's symptoms. 2. Mild distal small vessel disease. 3. Moderate proximal right A1 segment stenosis. 4. No other significant proximal stenosis, aneurysm, or branch vessel occlusion. Electronically Signed   By: Marin Roberts M.D.   On: 10/20/2021 18:24   MR BRAIN WO CONTRAST  Result Date: 10/20/2021 CLINICAL DATA:  Right-sided facial and upper extremity numbness. Bilateral upper extremity weakness. EXAM: MRI HEAD WITHOUT CONTRAST MRA HEAD WITHOUT CONTRAST TECHNIQUE: Multiplanar, multi-echo pulse sequences of the brain and surrounding structures were acquired without intravenous contrast. Angiographic images of the Circle of Willis were acquired using MRA technique without intravenous contrast. COMPARISON:  CT head without contrast 10/20/2021 FINDINGS: MRI HEAD FINDINGS Brain: Mild atrophy and white matter changes are likely within normal limits for age. No acute infarct, hemorrhage, or mass lesion is present. The ventricles are of normal size. No significant extraaxial fluid collection is present. The internal auditory canals are within normal limits. The brainstem and cerebellum are within normal limits. Vascular: Flow is present in the major intracranial arteries. Skull and upper cervical spine: The craniocervical junction is normal. Upper cervical spine is within normal limits. Marrow signal is unremarkable. Sinuses/Orbits: A polyp or mucous retention cyst  is noted in the inferior left maxillary sinus. The paranasal sinuses and mastoid air cells are otherwise clear. MRA HEAD FINDINGS Anterior circulation: Mild atherosclerotic irregularity is present in the cavernous internal carotid arteries bilaterally without significant stenosis. Moderate proximal right A1 segment stenosis is present. Mild narrowing is present at the proximal left A1. Mm 1 segments are within normal limits bilaterally. MCA bifurcations are intact. There is some attenuation of distal  MCA branch vessels bilaterally. Posterior circulation: The right vertebral artery is dominant. Fenestrated vertebrobasilar junction noted without aneurysm. Prominent AICA vessels are noted bilaterally. Basilar artery is normal. Both posterior cerebral arteries originate from basilar tip. Mild narrowing of the superior right P3 segment noted. PCA branch vessels are otherwise within normal limits. Anatomic variants: None IMPRESSION: 1. Normal MRI appearance of the brain for age. No acute or focal lesion to explain the patient's symptoms. 2. Mild distal small vessel disease. 3. Moderate proximal right A1 segment stenosis. 4. No other significant proximal stenosis, aneurysm, or branch vessel occlusion. Electronically Signed   By: Marin Roberts M.D.   On: 10/20/2021 18:24   CT HEAD CODE STROKE WO CONTRAST  Result Date: 10/20/2021 CLINICAL DATA:  Code stroke.  Right-sided numbness EXAM: CT HEAD WITHOUT CONTRAST TECHNIQUE: Contiguous axial images were obtained from the base of the skull through the vertex without intravenous contrast. COMPARISON:  None. FINDINGS: Brain: There is no acute intracranial hemorrhage, mass effect, or edema. Gray-white differentiation is preserved. Ventricles and sulci are within normal limits in size and configuration. No extra-axial collection. Vascular: No hyperdense vessel. Skull: Unremarkable. Sinuses/Orbits: No acute abnormality. Other: Mastoid air cells are clear. ASPECTS ([place] Stroke Program Early CT Score) - Ganglionic level infarction (caudate, lentiform nuclei, internal capsule, insula, M1-M3 cortex): 7 - Supraganglionic infarction (M4-M6 cortex): 3 Total score (0-10 with 10 being normal): 10 IMPRESSION: There is no acute intracranial hemorrhage or evidence of acute infarction. ASPECT score is 10. These results were communicated to Dr. Derry Lory at 1:54 pm on 10/20/2021 by text page via the [facility] messaging system. Electronically Signed   By: Guadlupe Spanish M.D.   On:  10/20/2021 13:58        Scheduled Meds:  aspirin EC  81 mg Oral Daily   atorvastatin  40 mg Oral Daily   clopidogrel  75 mg Oral Daily   enoxaparin (LOVENOX) injection  40 mg Subcutaneous Q24H   nicotine  14 mg Transdermal Q24H   Continuous Infusions:   LOS: 0 days    Time spent:  Zannie Cove, MD [facility]   10/21/2021, 9:40 AM

## 2021-10-21 NOTE — Evaluation (Signed)
Physical Therapy Evaluation Patient Details Name: Randy Collins MRN: [id number] DOB: 17-Mar-1961 Today's Date: 10/21/2021  History of Present Illness  Patient noticed he was hypertensive yesterday. He took a walk to try to bring it down and began to notice right facial droop and right UE weakness. His symptoms have resolved but now he continues to have mild syncope and numbness around his left eye. His MRI showed mild small vessel ischemic changes. PMH: Arthritis ( unspecified right lumb ar radiculopathy, HTN,  Clinical Impression  Patient presents with mild syncope with ambulation and increased B/P with ambulation. His initial B/P was measured at 138/82. After ambulation it was measured at 166/101. Nursing notified. He required supervision for gait and transfers but had no loss of balance. He reports his gait is limited at baseline 2nd to sciatica on the right. His right leg was weaker then the left but that is the side he has his sciatica on. Therapy will continue to see him in the hospital but he will not likely require follow up after discharge.      Recommendations for follow up therapy are one component of a multi-disciplinary discharge planning process, led by the attending physician.  Recommendations may be updated based on patient status, additional functional criteria and insurance authorization.  Follow Up Recommendations No PT follow up    Assistance Recommended at Discharge None  Functional Status Assessment Patient has had a recent decline in their functional status and demonstrates the ability to make significant improvements in function in a reasonable and predictable amount of time.  Equipment Recommendations  None recommended by PT    Recommendations for Other Services       Precautions / Restrictions Precautions Precautions: None Restrictions Weight Bearing Restrictions: No      Mobility  Bed Mobility Overal bed mobility: Independent             General bed  mobility comments: sat up at the edge of the bed without assistance    Transfers Overall transfer level: Needs assistance Equipment used: None Transfers: Sit to/from Stand Sit to Stand: Supervision           General transfer comment: supervision 2nd to report of mild syncope upon standing. Prior to standing B/P measured at  138/82    Ambulation/Gait Ambulation/Gait assistance: Min guard Gait Distance (Feet): 100 Feet Assistive device: None Gait Pattern/deviations: Step-to pattern Gait velocity: decreased Gait velocity interpretation: <1.31 ft/sec, indicative of household ambulator General Gait Details: Patient had lateral movement away from the right side and a slow gait pattenr. he reports this is baseline 2nd to his sciatica. His B/P went up to 166/101after amnbualtion  Stairs            Wheelchair Mobility    Modified Rankin (Stroke Patients Only) Modified Rankin (Stroke Patients Only) Pre-Morbid Rankin Score: No symptoms Modified Rankin: Slight disability     Balance Overall balance assessment: Needs assistance Sitting-balance support: No upper extremity supported Sitting balance-Leahy Scale: Good     Standing balance support: No upper extremity supported Standing balance-Leahy Scale: Fair Standing balance comment: needed supervision                             Pertinent Vitals/Pain Pain Assessment: Faces Faces Pain Scale: Hurts little more Pain Location: right LE Pain Descriptors / Indicators: Aching Pain Intervention(s): Limited activity within patient's tolerance;Monitored during session;Repositioned    Home Living Family/patient expects to be discharged to:: Private  residence Living Arrangements: Parent Available Help at Discharge: Family Type of Home: House Home Access: Level entry       Home Layout: One level Home Equipment: Agricultural consultant (2 wheels);Cane - single point Additional Comments: Patient takes care of his mom  during the day.    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 He was ambulatory and active       Hand Dominance   Dominant Hand: Right    Extremity/Trunk Assessment   Upper Extremity Assessment Upper Extremity Assessment: Defer to OT evaluation    Lower Extremity Assessment Lower Extremity Assessment: RLE deficits/detail RLE Deficits / Details: Right hip flexor weakness. he reports this is baseline       Communication   Communication: No difficulties  Cognition Arousal/Alertness: Awake/alert Behavior During Therapy: WFL for tasks assessed/performed Overall Cognitive Status: Within Functional Limits for tasks assessed                                          General Comments General comments (skin integrity, edema, etc.): Nu[place]: finger to nose normal; normal tracking; normal coordination    Exercises     Assessment/Plan    PT Assessment Patient needs continued PT services  PT Problem List Decreased mobility;Decreased activity tolerance;Pain       PT Treatment Interventions DME instruction;Gait training;Stair training;Functional mobility training;Therapeutic activities;Therapeutic exercise;Patient/family education    PT Goals (Current goals can be found in the Care Plan section)  Acute Rehab PT Goals Patient Stated Goal: to go home PT Goal Formulation: With patient    Frequency Min 2X/week   Barriers to discharge        Co-evaluation               AM-PAC PT "6 Clicks" Mobility  Outcome Measure Help needed turning from your back to your side while in a flat bed without using bedrails?: None Help needed moving from lying on your back to sitting on the side of a flat bed without using bedrails?: None Help needed moving to and from a bed to a chair (including a wheelchair)?: A Little Help needed standing up from a chair using your  arms (e.g., wheelchair or bedside chair)?: A Little Help needed to walk in hospital room?: A Little Help needed climbing 3-5 steps with a railing? : A Little 6 Click Score: 20    End of Session Equipment Utilized During Treatment: Gait belt Activity Tolerance: Patient tolerated treatment well Patient left: in chair;with call bell/phone within reach;with chair alarm set Nurse Communication: Mobility status PT Visit Diagnosis: Unsteadiness on feet (R26.81);Other abnormalities of gait and mobility (R26.89)    Time: 0539-7673 PT Time Calculation (min) (ACUTE ONLY): 25 min   Charges:   PT Evaluation $PT Eval Moderate Complexity: 1 Mod            Dessie Coma PT DPT  10/21/2021, 10:27 AM

## 2021-10-21 NOTE — Progress Notes (Signed)
  Echocardiogram 2D Echocardiogram has been performed.  Randy Collins F 10/21/2021, 9:59 AM

## 2021-10-21 NOTE — Progress Notes (Signed)
OT Cancellation Note  Patient Details Name: Randy Collins MRN: [id number] DOB: 02-Jan-1961   Cancelled Treatment:    Reason Eval/Treat Not Completed: Patient at procedure or test/ unavailable. Pt off the floor for an Echo, OT will follow up as time allows.   Deshondra Worst H., OTR/L Acute Rehabilitation  Makeba Delcastillo Elane Bing Plume 10/21/2021, 9:39 AM

## 2021-10-21 NOTE — Plan of Care (Signed)
Pt is alert oriented x 4.  Pt uses urinal. Pt denies numbness and tingling to right side of face this morning. Pt ordered breakfast. Pt resting.   Problem: Education: Goal: Knowledge of General Education information will improve Description: Including pain rating scale, medication(s)/side effects and non-pharmacologic comfort measures Outcome: Progressing   Problem: Health Behavior/Discharge Planning: Goal: Ability to manage health-related needs will improve Outcome: Progressing   Problem: Clinical Measurements: Goal: Ability to maintain clinical measurements within normal limits will improve Outcome: Progressing   Problem: Clinical Measurements: Goal: Ability to maintain clinical measurements within normal limits will improve Outcome: Progressing Goal: Will remain free from infection Outcome: Progressing Goal: Diagnostic test results will improve Outcome: Progressing Goal: Respiratory complications will improve Outcome: Progressing Goal: Cardiovascular complication will be avoided Outcome: Progressing   Problem: Nutrition: Goal: Adequate nutrition will be maintained Outcome: Progressing   Problem: Activity: Goal: Risk for activity intolerance will decrease Outcome: Progressing   Problem: Coping: Goal: Level of anxiety will decrease Outcome: Progressing   Problem: Elimination: Goal: Will not experience complications related to bowel motility Outcome: Progressing Goal: Will not experience complications related to urinary retention Outcome: Progressing   Problem: Pain Managment: Goal: General experience of comfort will improve Outcome: Progressing   Problem: Safety: Goal: Ability to remain free from injury will improve Outcome: Progressing   Problem: Skin Integrity: Goal: Risk for impaired skin integrity will decrease Outcome: Progressing   Problem: Coping: Goal: Will verbalize positive feelings about self Outcome: Progressing Goal: Will identify appropriate  support needs Outcome: Progressing   Problem: Self-Care: Goal: Ability to participate in self-care as condition permits will improve Outcome: Progressing

## 2021-10-22 DIAGNOSIS — F3189 Other bipolar disorder: Secondary | ICD-10-CM | POA: Diagnosis not present

## 2021-10-22 MED ORDER — CLOPIDOGREL BISULFATE 75 MG PO TABS: 75.0000 mg | ORAL_TABLET | Freq: Every day | ORAL | 0 refills | Status: AC

## 2021-10-22 MED ORDER — METHOCARBAMOL 500 MG PO TABS: 500.0000 mg | ORAL_TABLET | Freq: Two times a day (BID) | ORAL | 0 refills | Status: DC | PRN

## 2021-10-22 MED ORDER — ASPIRIN 81 MG PO TBEC: 81.0000 mg | DELAYED_RELEASE_TABLET | Freq: Every day | ORAL | Status: DC

## 2021-10-22 MED ORDER — ATORVASTATIN CALCIUM 40 MG PO TABS: 40.0000 mg | ORAL_TABLET | Freq: Every day | ORAL | 0 refills | Status: DC

## 2021-10-22 MED ORDER — AMLODIPINE BESYLATE 10 MG PO TABS: 10.0000 mg | ORAL_TABLET | Freq: Every day | ORAL | 1 refills | Status: DC

## 2021-10-22 NOTE — Progress Notes (Signed)
Cyril Loosen to be D/C'd home per MD order. Discussed with the patient and all questions fully answered.  Skin clean, dry and intact without evidence of skin break down, no evidence of skin tears noted.  IV catheter discontinued intact. Site without signs and symptoms of complications. Dressing and pressure applied.  An After Visit Summary was printed and given to the patient.  Patient escorted via WC, and D/C home via private auto.  Jon Gills  10/22/2021 12:06 PM

## 2021-10-23 ENCOUNTER — Telehealth: Payer: Self-pay

## 2021-10-23 NOTE — Discharge Summary (Signed)
Physician Discharge Summary  Randy Collins JDB:[id number] DOB: 1961/07/23 DOA: 10/20/2021  PCP: Julieanne Manson, MD  Admit date: 10/20/2021 Discharge date: 10/22/2021  Time spent: 35 minutes  Recommendations for Outpatient Follow-up:  PCP in 1 week [facility] in 4 weeks  Discharge Diagnoses:  TIA versus hypertensive emergency   Other bipolar disorder [facility])   Arthritis   Right lumbar radiculopathy   Hypertensive urgency   Tobacco abuse   Right arm numbness   Elevated troponin   Discharge Condition: Stable  Diet recommendation: Low-sodium  Filed Weights   10/20/21 1325  Weight: 66.4 kg    History of present illness:   60/M with history of hypertension, osteoarthritis, psoriasis, tobacco and polysubstance abuse presented to the ED with right facial and right arm weakness yesterday.  His BP was around 220 systolic with EMS.In the emergency room his blood pressure was in the 160s, labs were notable for creatinine of 1.3, CT head was unremarkable, neurology was consulted and he was admitted for TIA/stroke work-up UDS positive for cocaine, cannabis  Hospital Course:   Right facial numbness, arm weakness Hypertensive urgency Cocaine use -MRI negative for CVA, symptoms improving but have not resolved -I suspect this is secondary to hypertensive crisis vs TIA -Seen by neurology in consultation, recommended aspirin 81 mg and Plavix 75 mg daily for 3 weeks followed by aspirin 81 mg daily alone -LDL is 80, hemoglobin A1c is 5.4 -2D echo done results pending -PT OT eval today   Accelerated hypertension -Not on any meds, has not seen a doctor in over 4 years -Started on amlodipine, case management consulted for PCP   History of mood disorder -Previously on Zyprexa, has not been on meds for years,  -Follow-up with psychiatry   Cocaine Cannabis use disorder -Counseled   Tobacco abuse -Counseled, nicotine patch  Discharge Exam: Vitals:   10/22/21 0428  10/22/21 0731  BP: (!) 164/94 (!) 166/97  Pulse: 62 64  Resp: 18 12  Temp: (!) 97.5 F (36.4 C)   SpO2: 98% 95%   General exam: Appears calm and comfortable  Respiratory system: Clear to auscultation Cardiovascular system: S1 & S2 heard, RRR.  Abd: nondistended, soft and nontender.Normal bowel sounds heard. Central nervous system: Alert and oriented.  Mild right facial numbness and arm weakness improving Extremities: no edema Skin: No rashes Psychiatry: Judgement and insight appear normal. Mood & affect appropriate .  Discharge Instructions   Discharge Instructions     Ambulatory referral to Neurology   Complete by: As directed    Follow up with stroke clinic NP (Jessica Vanschaick or Darrol Angel, if both not available, consider Manson Allan, or Ahern) at [facility] in about 4 weeks. Thanks.   Ambulatory referral to Psychiatry   Complete by: As directed    Diet - low sodium heart healthy   Complete by: As directed    Increase activity slowly   Complete by: As directed       Allergies as of 10/22/2021   No Known Allergies      Medication List     STOP taking these medications    Bayer Back & Body Pain Ex St 500-32.5 MG Tabs Generic drug: Aspirin-Caffeine   gabapentin 100 MG capsule Commonly known as: NEURONTIN   OLANZapine 2.5 MG tablet Commonly known as: ZYPREXA       TAKE these medications    amLODipine 10 MG tablet Commonly known as: NORVASC Take 1 tablet (10 mg total) by mouth daily.   aspirin  81 MG EC tablet Take 1 tablet (81 mg total) by mouth daily. Swallow whole.   atorvastatin 40 MG tablet Commonly known as: LIPITOR Take 1 tablet (40 mg total) by mouth daily.   clopidogrel 75 MG tablet Commonly known as: PLAVIX Take 1 tablet (75 mg total) by mouth daily for 21 days.   methocarbamol 500 MG tablet Commonly known as: ROBAXIN Take 1 tablet (500 mg total) by mouth 2 (two) times daily as needed for muscle spasms. What changed:  when to  take this reasons to take this   mometasone 50 MCG/ACT nasal spray Commonly known as: Nasonex 2 sprays each nostril daily What changed:  how much to take how to take this when to take this reasons to take this additional instructions       No Known Allergies  Follow-up Information     [facility]. Schedule an appointment as soon as possible for a visit in 1 month(s).   Specialty: Neurology Why: stroke clinic Contact information: [address] [place] [address] [phone]        Julieanne Manson, MD. Schedule an appointment as soon as possible for a visit in 1 week(s).   Specialty: Internal Medicine Contact information: [address] [place] [address] [phone]                  The results of significant diagnostics from this hospitalization (including imaging, microbiology, ancillary and laboratory) are listed below for reference.    Significant Diagnostic Studies: DG Chest 2 View  Result Date: 10/20/2021 CLINICAL DATA:  Chest pain EXAM: CHEST - 2 VIEW COMPARISON:  07/30/2010 FINDINGS: The heart size and mediastinal contours are within normal limits. Both lungs are clear. The visualized skeletal structures are unremarkable. IMPRESSION: No acute abnormality of the lungs. Electronically Signed   By: Jearld Lesch M.D.   On: 10/20/2021 14:12   MR ANGIO HEAD WO CONTRAST  Result Date: 10/20/2021 CLINICAL DATA:  Right-sided facial and upper extremity numbness. Bilateral upper extremity weakness. EXAM: MRI HEAD WITHOUT CONTRAST MRA HEAD WITHOUT CONTRAST TECHNIQUE: Multiplanar, multi-echo pulse sequences of the brain and surrounding structures were acquired without intravenous contrast. Angiographic images of the Circle of Willis were acquired using MRA technique without intravenous contrast. COMPARISON:  CT head without contrast 10/20/2021 FINDINGS: MRI HEAD FINDINGS Brain: Mild atrophy and white matter  changes are likely within normal limits for age. No acute infarct, hemorrhage, or mass lesion is present. The ventricles are of normal size. No significant extraaxial fluid collection is present. The internal auditory canals are within normal limits. The brainstem and cerebellum are within normal limits. Vascular: Flow is present in the major intracranial arteries. Skull and upper cervical spine: The craniocervical junction is normal. Upper cervical spine is within normal limits. Marrow signal is unremarkable. Sinuses/Orbits: A polyp or mucous retention cyst is noted in the inferior left maxillary sinus. The paranasal sinuses and mastoid air cells are otherwise clear. MRA HEAD FINDINGS Anterior circulation: Mild atherosclerotic irregularity is present in the cavernous internal carotid arteries bilaterally without significant stenosis. Moderate proximal right A1 segment stenosis is present. Mild narrowing is present at the proximal left A1. Mm 1 segments are within normal limits bilaterally. MCA bifurcations are intact. There is some attenuation of distal MCA branch vessels bilaterally. Posterior circulation: The right vertebral artery is dominant. Fenestrated vertebrobasilar junction noted without aneurysm. Prominent AICA vessels are noted bilaterally. Basilar artery is normal. Both posterior cerebral arteries originate from basilar tip.  Mild narrowing of the superior right P3 segment noted. PCA branch vessels are otherwise within normal limits. Anatomic variants: None IMPRESSION: 1. Normal MRI appearance of the brain for age. No acute or focal lesion to explain the patient's symptoms. 2. Mild distal small vessel disease. 3. Moderate proximal right A1 segment stenosis. 4. No other significant proximal stenosis, aneurysm, or branch vessel occlusion. Electronically Signed   By: Marin Roberts M.D.   On: 10/20/2021 18:24   MR BRAIN WO CONTRAST  Result Date: 10/20/2021 CLINICAL DATA:  Right-sided facial and  upper extremity numbness. Bilateral upper extremity weakness. EXAM: MRI HEAD WITHOUT CONTRAST MRA HEAD WITHOUT CONTRAST TECHNIQUE: Multiplanar, multi-echo pulse sequences of the brain and surrounding structures were acquired without intravenous contrast. Angiographic images of the Circle of Willis were acquired using MRA technique without intravenous contrast. COMPARISON:  CT head without contrast 10/20/2021 FINDINGS: MRI HEAD FINDINGS Brain: Mild atrophy and white matter changes are likely within normal limits for age. No acute infarct, hemorrhage, or mass lesion is present. The ventricles are of normal size. No significant extraaxial fluid collection is present. The internal auditory canals are within normal limits. The brainstem and cerebellum are within normal limits. Vascular: Flow is present in the major intracranial arteries. Skull and upper cervical spine: The craniocervical junction is normal. Upper cervical spine is within normal limits. Marrow signal is unremarkable. Sinuses/Orbits: A polyp or mucous retention cyst is noted in the inferior left maxillary sinus. The paranasal sinuses and mastoid air cells are otherwise clear. MRA HEAD FINDINGS Anterior circulation: Mild atherosclerotic irregularity is present in the cavernous internal carotid arteries bilaterally without significant stenosis. Moderate proximal right A1 segment stenosis is present. Mild narrowing is present at the proximal left A1. Mm 1 segments are within normal limits bilaterally. MCA bifurcations are intact. There is some attenuation of distal MCA branch vessels bilaterally. Posterior circulation: The right vertebral artery is dominant. Fenestrated vertebrobasilar junction noted without aneurysm. Prominent AICA vessels are noted bilaterally. Basilar artery is normal. Both posterior cerebral arteries originate from basilar tip. Mild narrowing of the superior right P3 segment noted. PCA branch vessels are otherwise within normal limits.  Anatomic variants: None IMPRESSION: 1. Normal MRI appearance of the brain for age. No acute or focal lesion to explain the patient's symptoms. 2. Mild distal small vessel disease. 3. Moderate proximal right A1 segment stenosis. 4. No other significant proximal stenosis, aneurysm, or branch vessel occlusion. Electronically Signed   By: Marin Roberts M.D.   On: 10/20/2021 18:24   ECHOCARDIOGRAM COMPLETE  Result Date: 10/21/2021    ECHOCARDIOGRAM REPORT   Patient Name:   DIGBY GROENEVELD [facility] Date of Exam: 10/21/2021 Medical Rec #:  [id number]        Height:       69.0 in Accession #:    [id number]       Weight:       146.4 lb Date of Birth:  1961-08-18         BSA:          1.809 m Patient Age:    60 years         BP:           156/96 mmHg Patient Gender: M                HR:           64 bpm. Exam Location:  Inpatient Procedure: 2D Echo, Cardiac Doppler and Color Doppler Indications:   Stroke I63.9  History:       Patient has no prior history of Echocardiogram examinations.                Stroke.  Sonographer:   Roosvelt Maser RDCS Referring      [phone] Orland Mustard Phys: IMPRESSIONS  1. Left ventricular ejection fraction, by estimation, is 60 to 65%. The left ventricle has normal function. The left ventricle has no regional wall motion abnormalities. Left ventricular diastolic function could not be evaluated.  2. Right ventricular systolic function is normal. The right ventricular size is normal. There is normal pulmonary artery systolic pressure. The estimated right ventricular systolic pressure is 19.8 mmHg.  3. The mitral valve is normal in structure. Mild mitral valve regurgitation. No evidence of mitral stenosis.  4. The aortic valve is normal in structure. Aortic valve regurgitation is not visualized. No aortic stenosis is present.  5. The inferior vena cava is normal in size with greater than 50% respiratory variability, suggesting right atrial pressure of 3 mmHg. FINDINGS  Left Ventricle: Left  ventricular ejection fraction, by estimation, is 60 to 65%. The left ventricle has normal function. The left ventricle has no regional wall motion abnormalities. The left ventricular internal cavity size was normal in size. There is  no left ventricular hypertrophy. Left ventricular diastolic function could not be evaluated. Right Ventricle: The right ventricular size is normal. No increase in right ventricular wall thickness. Right ventricular systolic function is normal. There is normal pulmonary artery systolic pressure. The tricuspid regurgitant velocity is 2.05 m/s, and  with an assumed right atrial pressure of 3 mmHg, the estimated right ventricular systolic pressure is 19.8 mmHg. Left Atrium: Left atrial size was normal in size. Right Atrium: Right atrial size was normal in size. Pericardium: There is no evidence of pericardial effusion. Mitral Valve: The mitral valve is normal in structure. Mild mitral valve regurgitation. No evidence of mitral valve stenosis. Tricuspid Valve: The tricuspid valve is normal in structure. Tricuspid valve regurgitation is not demonstrated. No evidence of tricuspid stenosis. Aortic Valve: The aortic valve is normal in structure. Aortic valve regurgitation is not visualized. No aortic stenosis is present. Aortic valve mean gradient measures 4.0 mmHg. Aortic valve peak gradient measures 7.1 mmHg. Pulmonic Valve: The pulmonic valve was normal in structure. Pulmonic valve regurgitation is not visualized. No evidence of pulmonic stenosis. Aorta: The aortic root is normal in size and structure. Venous: The inferior vena cava is normal in size with greater than 50% respiratory variability, suggesting right atrial pressure of 3 mmHg. IAS/Shunts: No atrial level shunt detected by color flow Doppler.   Diastology LV e' medial:    6.68 cm/s LV E/e' medial:  11.1 LV e' lateral:   6.53 cm/s LV E/e' lateral: 11.3  RIGHT VENTRICLE          IVC RV Basal diam:  3.10 cm  IVC diam: 1.20 cm LEFT  ATRIUM             Index        RIGHT ATRIUM           Index LA Vol (A2C):   43.5 ml 24.04 ml/m  RA Area:     13.20 cm LA Vol (A4C):   47.1 ml 26.03 ml/m  RA Volume:   34.70 ml  19.18 ml/m LA Biplane Vol: 45.4 ml 25.09 ml/m  AORTIC VALVE AV Vmax:           133.00 cm/s AV Vmean:  87.900 cm/s AV VTI:            0.239 m AV Peak Grad:      7.1 mmHg AV Mean Grad:      4.0 mmHg LVOT Vmax:         101.00 cm/s LVOT Vmean:        59.500 cm/s LVOT VTI:          0.165 m LVOT/AV VTI ratio: 0.69 MITRAL VALVE               TRICUSPID VALVE MV Area (PHT): 3.34 cm    TR Peak grad:   16.8 mmHg MV Decel Time: 227 msec    TR Vmax:        205.00 cm/s MV E velocity: 73.90 cm/s MV A velocity: 70.90 cm/s  SHUNTS MV E/A ratio:  1.04        Systemic VTI: 0.16 m Armanda Magic MD Electronically signed by Armanda Magic MD Signature Date/Time: 10/21/2021/12:22:33 PM    Final    CT HEAD CODE STROKE WO CONTRAST  Result Date: 10/20/2021 CLINICAL DATA:  Code stroke.  Right-sided numbness EXAM: CT HEAD WITHOUT CONTRAST TECHNIQUE: Contiguous axial images were obtained from the base of the skull through the vertex without intravenous contrast. COMPARISON:  None. FINDINGS: Brain: There is no acute intracranial hemorrhage, mass effect, or edema. Gray-white differentiation is preserved. Ventricles and sulci are within normal limits in size and configuration. No extra-axial collection. Vascular: No hyperdense vessel. Skull: Unremarkable. Sinuses/Orbits: No acute abnormality. Other: Mastoid air cells are clear. ASPECTS ([place]ta Stroke Program Early CT Score) - Ganglionic level infarction (caudate, lentiform nuclei, internal capsule, insula, M1-M3 cortex): 7 - Supraganglionic infarction (M4-M6 cortex): 3 Total score (0-10 with 10 being normal): 10 IMPRESSION: There is no acute intracranial hemorrhage or evidence of acute infarction. ASPECT score is 10. These results were communicated to Dr. Derry Lory at 1:54 pm on 10/20/2021 by text page  via the [facility] messaging system. Electronically Signed   By: Guadlupe Spanish M.D.   On: 10/20/2021 13:58   VAS US CAROTID (at [facility] and [facility] only)  Result Date: 10/21/2021 Carotid Arterial Duplex Study Patient Name:  ERASMUS BISTLINE [facility]  Date of Exam:   10/21/2021 Medical Rec #: [id number]         Accession #:    [id number] Date of Birth: 11/08/1961          Patient Gender: M Patient Age:   50 years Exam Location:  [facility] Procedure:      VAS US CAROTID Referring Phys: Orland Mustard --------------------------------------------------------------------------------  Indications:       CVA. Risk Factors:      Hypertension, current smoker. Comparison Study:  No prior studies. Performing Technologist: Jean Rosenthal RDMS, RVT  Examination Guidelines: A complete evaluation includes B-mode imaging, spectral Doppler, color Doppler, and power Doppler as needed of all accessible portions of each vessel. Bilateral testing is considered an integral part of a complete examination. Limited examinations for reoccurring indications may be performed as noted.  Right Carotid Findings: +----------+--------+--------+--------+------------------+------------------+           PSV cm/sEDV cm/sStenosisPlaque DescriptionComments           +----------+--------+--------+--------+------------------+------------------+ CCA Prox  90      29                                                   +----------+--------+--------+--------+------------------+------------------+  CCA Distal84      29                                                   +----------+--------+--------+--------+------------------+------------------+ ICA Prox  52      22                                intimal thickening +----------+--------+--------+--------+------------------+------------------+ ICA Distal94      45                                                   +----------+--------+--------+--------+------------------+------------------+ ECA        106     28                                                   +----------+--------+--------+--------+------------------+------------------+ +----------+--------+-------+----------------+-------------------+           PSV cm/sEDV cmsDescribe        Arm Pressure (mmHG) +----------+--------+-------+----------------+-------------------+ [id number]            Multiphasic, WNL                    +----------+--------+-------+----------------+-------------------+ +---------+--------+--+--------+--+---------+ VertebralPSV cm/s62EDV cm/s22Antegrade +---------+--------+--+--------+--+---------+  Left Carotid Findings: +----------+--------+--------+--------+-------------------+--------+           PSV cm/sEDV cm/sStenosisPlaque Description Comments +----------+--------+--------+--------+-------------------+--------+ CCA Prox  113     27                                          +----------+--------+--------+--------+-------------------+--------+ CCA Distal73      24                                          +----------+--------+--------+--------+-------------------+--------+ ICA Prox  58      17      1-39%   calcific and smooth         +----------+--------+--------+--------+-------------------+--------+ ICA Distal102     45                                          +----------+--------+--------+--------+-------------------+--------+ ECA       51      12                                          +----------+--------+--------+--------+-------------------+--------+ +----------+--------+--------+----------------+-------------------+           PSV cm/sEDV cm/sDescribe        Arm Pressure (mmHG) +----------+--------+--------+----------------+-------------------+ [id number]             Multiphasic, WNL                    +----------+--------+--------+----------------+-------------------+ +---------+--------+--+--------+--+---------+  VertebralPSV  cm/s54EDV cm/s17Antegrade +---------+--------+--+--------+--+---------+   Summary: Right Carotid: The extracranial vessels were near-normal with only minimal wall                thickening or plaque. Left Carotid: Velocities in the left ICA are consistent with a 1-39% stenosis. Vertebrals:  Bilateral vertebral arteries demonstrate antegrade flow. Subclavians: Normal flow hemodynamics were seen in bilateral subclavian              arteries. *See table(s) above for measurements and observations.  Electronically signed by Heath Lark on 10/21/2021 at 10:29:12 AM.    Final     Microbiology: Recent Results (from the past 240 hour(s))  Resp Panel by RT-PCR (Flu A&B, Covid) Nasopharyngeal Swab     Status: None   Collection Time: 10/20/21  2:23 PM   Specimen: Nasopharyngeal Swab; Nasopharyngeal(NP) swabs in vial transport medium  Result Value Ref Range Status   SARS Coronavirus 2 by RT PCR NEGATIVE NEGATIVE Final    Comment: (NOTE) SARS-CoV-2 target nucleic acids are NOT DETECTED.  The SARS-CoV-2 RNA is generally detectable in upper respiratory specimens during the acute phase of infection. The lowest concentration of SARS-CoV-2 viral copies this assay can detect is 138 copies/mL. A negative result does not preclude SARS-Cov-2 infection and should not be used as the sole basis for treatment or other patient management decisions. A negative result may occur with  improper specimen collection/handling, submission of specimen other than nasopharyngeal swab, presence of viral mutation(s) within the areas targeted by this assay, and inadequate number of viral copies(<138 copies/mL). A negative result must be combined with clinical observations, patient history, and epidemiological information. The expected result is Negative.  Fact Sheet for Patients:  BloggerCourse.com  Fact Sheet for Healthcare Providers:  SeriousBroker.it  This test is no t  yet approved or cleared by the [place] FDA and  has been authorized for detection and/or diagnosis of SARS-CoV-2 by FDA under an Emergency Use Authorization (EUA). This EUA will remain  in effect (meaning this test can be used) for the duration of the COVID-19 declaration under Section 564(b)(1) of the Act, 21 U.S.C.section 360bbb-3(b)(1), unless the authorization is terminated  or revoked sooner.       Influenza A by PCR NEGATIVE NEGATIVE Final   Influenza B by PCR NEGATIVE NEGATIVE Final    Comment: (NOTE) The Xpert Xpress SARS-CoV-2/FLU/RSV plus assay is intended as an aid in the diagnosis of influenza from Nasopharyngeal swab specimens and should not be used as a sole basis for treatment. Nasal washings and aspirates are unacceptable for Xpert Xpress SARS-CoV-2/FLU/RSV testing.  Fact Sheet for Patients: BloggerCourse.com  Fact Sheet for Healthcare Providers: SeriousBroker.it  This test is not yet approved or cleared by the [place] FDA and has been authorized for detection and/or diagnosis of SARS-CoV-2 by FDA under an Emergency Use Authorization (EUA). This EUA will remain in effect (meaning this test can be used) for the duration of the COVID-19 declaration under Section 564(b)(1) of the Act, 21 U.S.C. section 360bbb-3(b)(1), unless the authorization is terminated or revoked.  Performed at [facility], [address], [place], [place] [address]      Labs: Basic Metabolic Panel: Recent Labs  Lab 10/20/21 1338 10/20/21 1345  NA 138 139  K 4.7 4.7  CL 102 103  CO2 30  --   GLUCOSE 99 94  BUN 15 19  CREATININE 1.35* 1.30*  CALCIUM 9.0  --    Liver Function Tests: Recent Labs  Lab 10/20/21 1338  AST 40  ALT 20  ALKPHOS 60  BILITOT 0.9  PROT 6.0*  ALBUMIN 3.4*   No results for input(s): LIPASE, AMYLASE in the last 168 hours. No results for input(s): AMMONIA in the last 168  hours. CBC: Recent Labs  Lab 10/20/21 1327 10/20/21 1345  WBC 7.3  --   NEUTROABS 5.0  --   HGB 15.5 16.7  HCT 48.0 49.0  MCV 92.8  --   PLT 340  --    Cardiac Enzymes: No results for input(s): CKTOTAL, CKMB, CKMBINDEX, TROPONINI in the last 168 hours. BNP: BNP (last 3 results) No results for input(s): BNP in the last 8760 hours.  ProBNP (last 3 results) No results for input(s): PROBNP in the last 8760 hours.  CBG: No results for input(s): GLUCAP in the last 168 hours.     Signed:  Zannie Cove MD.  [facility] 10/23/2021, 2:54 PM

## 2021-10-23 NOTE — Telephone Encounter (Signed)
Pt has made an appt to reestablish care. Would like a sooner appt after visiting the ED for a stroke 10/20/2021

## 2021-10-25 ENCOUNTER — Ambulatory Visit (HOSPITAL_COMMUNITY)
Admission: EM | Admit: 2021-10-25 | Discharge: 2021-10-25 | Disposition: A | Discharge status: Home / Self Care | Payer: Medicaid Other

## 2021-10-25 DIAGNOSIS — G4709 Other insomnia: Secondary | ICD-10-CM

## 2021-10-25 DIAGNOSIS — F322 Major depressive disorder, single episode, severe without psychotic features: Secondary | ICD-10-CM

## 2021-10-25 NOTE — Discharge Summary (Signed)
Randy Collins to be D/C'd Home per NP order. An After Visit Summary was printed and given to the patient by provider. Patient escorted out, and D/C home via private auto.  Randy Collins  10/25/2021 12:21 PM

## 2021-10-25 NOTE — ED Provider Notes (Signed)
Behavioral Health Urgent Care Medical Screening Exam  Patient Name: Randy Collins MRN: [id number] Date of Evaluation: 10/25/21 Chief Complaint:   Diagnosis:  Final diagnoses:  Major depressive disorder, single episode, severe ([place])  Other insomnia    History of Present illness: Randy Collins is a 60 y.o. male.  Presents to [facility] urgent care requesting to be restarted on medications.  States he was recently diagnosed with a "light stroke" and was advised to follow-up with his overall health.  Randy Collins is denying suicidal homicidal ideations.  Denies auditory or visual hallucinations.  Reports he was prescribed medication 5 or 6 years ago to help with mood.  Patient is unable to recall the name of medications.  Reports a history of auditory hallucinations currently denying hearing or seeing things.  Chart review patient has orders for gabapentin and Zyprexa.  Denied that he is followed by therapy or psychiatry.  Patient is requesting to be restarted on medication due to his mood and insomnia.     Chart reviewed UDS positive for cocaine and marijuana.  We will make additional outpatient resources available for psychiatry/therapy for walk-in clinic.  Patient was receptive to plan.  Lannette Donath, 60 y.o., male patient seen face to face by this provider and chart reviewed on 10/25/21.  On evaluation Randy Collins reports  During evaluation Randy Collins is sitting in no acute distress.  He is alert/oriented x 4; calm/cooperative; and mood congruent with affect. He is speaking in a clear tone at moderate volume, and normal pace; with good eye contact.  His thought process is coherent and relevant; There is no indication that he is currently responding to internal/external stimuli or experiencing delusional thought content; and he  has denied suicidal/self-harm/homicidal ideation, psychosis, and paranoia.  He denies that he has been experiencing auditory hallucinations for quite  some time.  Patient has remained calm throughout assessment and has answered questions appropriately.     At this time Randy Collins is educated and verbalizes understanding of mental health resources and other crisis services in the community. He is instructed to call 911 and present to the nearest emergency room should he experience any suicidal/homicidal ideation, auditory/visual/hallucinations, or detrimental worsening of her mental health condition. He was a also advised by Probation officer that he could call the toll-free phone on insurance card to assist with identifying in network counselors and agencies or number on back of Medicaid card t speak with care coordinator     Psychiatric Specialty Exam  Presentation  General Appearance:Appropriate for Environment  Eye Contact:Good  Speech:Clear and Coherent  Speech Volume:Normal  Handedness:Right   Mood and Affect  Mood:Euthymic  Affect:Appropriate   Thought Process  Thought Processes:Coherent; Linear  Descriptions of Associations:Intact  Orientation:None  Thought Content:Logical    Hallucinations:None  Ideas of Reference:None  Suicidal Thoughts:No  Homicidal Thoughts:No   Sensorium  Memory:Immediate Good; Recent Good; Remote Good  Judgment:Fair  Insight:Fair   Executive Functions  Concentration:Good  Attention Span:Good  [place]  Language:Good   Psychomotor Activity  Psychomotor Activity:Normal   Assets  Assets:Communication Skills; Desire for Improvement   Sleep  Sleep:Fair  Number of hours: No data recorded  No data recorded  Physical Exam: Physical Exam Vitals reviewed.  HENT:     Head: Normocephalic.  Cardiovascular:     Rate and Rhythm: Normal rate.  Pulmonary:     Breath sounds: Normal breath sounds.  Neurological:     Mental Status: He is  alert and oriented to person, place, and time.  Psychiatric:        Attention and Perception: Attention  normal.        Mood and Affect: Mood normal.        Speech: Speech normal.        Behavior: Behavior normal.        Thought Content: Thought content normal.        Cognition and Memory: Cognition normal.        Judgment: Judgment normal.   Review of Systems  HENT: Negative.    Cardiovascular: Negative.   Genitourinary: Negative.   Psychiatric/Behavioral:  Positive for depression. Negative for hallucinations. The patient is not nervous/anxious.   All other systems reviewed and are negative. Blood pressure (!) 169/94, pulse 75, temperature 97.8 F (36.6 C), temperature source Oral, resp. rate 16, SpO2 99 %. There is no height or weight on file to calculate BMI.  Musculoskeletal: Strength & Muscle Tone: within normal limits Gait & Station: normal Patient leans: N/A   [facility] MSE Discharge Disposition for Follow up and Recommendations: Based on my evaluation the patient does not appear to have an emergency medical condition and can be discharged with resources and follow up care in outpatient services for Medication Management and Individual Therapy  Patient to follow-up with the walk-in assessment clinic  Oneta Rack, NP 10/25/2021, 12:13 PM

## 2021-10-25 NOTE — Discharge Instructions (Signed)
Take all medications as prescribed. Keep all follow-up appointments as scheduled.  Do not consume alcohol or use illegal drugs while on prescription medications. Report any adverse effects from your medications to your primary care provider promptly.  In the event of recurrent symptoms or worsening symptoms, call 911, a crisis hotline, or go to the nearest emergency department for evaluation.   

## 2021-10-26 ENCOUNTER — Telehealth (HOSPITAL_COMMUNITY): Payer: Self-pay | Admitting: Internal Medicine

## 2021-10-26 NOTE — BH Assessment (Signed)
Care Management - Follow Up [facility] Discharges   Writer attempted to make contact with patient today and was unsuccessful.  Writer left a HIPPA compliant voice message.   

## 2021-11-21 ENCOUNTER — Inpatient Hospital Stay: Payer: Medicaid Other | Admitting: Adult Health

## 2021-11-21 ENCOUNTER — Encounter: Payer: Self-pay | Admitting: Adult Health

## 2021-11-21 NOTE — Progress Notes (Deleted)
[facility] [address] [place]. [place] [address] [phone]       HOSPITAL FOLLOW UP NOTE  [name] Date of Birth:  10-19-1961 Medical Record Number:  [id number]   Reason for Referral:  hospital stroke follow up    SUBJECTIVE:   CHIEF COMPLAINT:  No chief complaint on file.   HPI:   Randy Collins is a 60 year old male with history of hypertension, smoker and lower back pain with right sciatica who presented on 10/20/2021 admitted for right face and arm numbness and high blood pressure (SBP 220s with EMS) with right arm numbness resolved, right face only complaining mild tingling at periorbital area on the right.  Personally reviewed hospitalization pertinent progress notes, lab work and imaging.  Evaluated by Dr. Erlinda Hong.  CT no acute abnormality.  MRI no acute infarct.  MRA and prior Doppler unremarkable.  EF 60 to 65%.  LDL 88, A1c 5.4.  Creatinine 1.30.  UDS showed positive for cocaine and THC.  Etiology for symptoms per Dr. Erlinda Hong concerning for TIA in setting of stroke risk factors including cocaine use, tobacco use, THC use and HTN.  Recommended DAPT for 3 weeks and aspirin alone and initiated atorvastatin 40mg  daily.  HTN stabilized and started on amlodipine.  Polysubstance abuse cessation counseling provided. Eval by psychiatry for underlying mood disorder and advised follow-up outpatient. PT/OT no therapy needs and d/c'd home on 10/22/2021.        PERTINENT IMAGING/LABS  Per recent hospitalization Code Stroke  CT head No acute abnormality.  Small vessel disease. Atrophy.  ASPECTS 10.     MRI  BRAIN: 1. Normal MRI appearance of the brain for age. No acute or focal lesion to explain the patient's symptoms. MRA head  Mild distal small vessel disease. 3. Moderate proximal right A1 segment stenosis. 4. No other significant proximal stenosis, aneurysm, or branch vessel occlusion. Carotid Doppler   left ICA 1-39% stenosis otherwise unremarkable   2D Echo EF 60-65% LDL 80 HgbA1c 5.4   Lipid Panel     Component Value Date/Time   CHOL 163 10/21/2021 0623   CHOL 174 11/07/2018 0950   TRIG 65 10/21/2021 0623   HDL 70 10/21/2021 0623   HDL 91 11/07/2018 0950   CHOLHDL 2.3 10/21/2021 0623   VLDL 13 10/21/2021 0623   LDLCALC 80 10/21/2021 0623   LDLCALC 73 11/07/2018 0950   LABVLDL 10 11/07/2018 0950         ROS:   14 system review of systems performed and negative with exception of ***  PMH:  Past Medical History:  Diagnosis Date   Arthritis 2009   likely psoriatic arthritis--inflammatory, but not symmetric, left hip and back, right elbow, both knees   Hypertension 2014   Psoriasis 2009   Right lumbar radiculopathy 2019    PSH: No past surgical history on file.  Social History:  Social History   Socioeconomic History   Marital status: Single    Spouse name: Not on file   Number of children: 1   Years of education: Not on file   Highest education level: 9th grade  Occupational History   Not on file  Tobacco Use   Smoking status: Every Day    Packs/day: 1.00    Types: Cigarettes   Smokeless tobacco: Never   Tobacco comments:    getting in program at [facility] Use   Vaping Use: Never used  Substance and Sexual Activity   Alcohol use:  Yes    Comment: History of abuse.  Occasional beer now.   Drug use: Not Currently    Comment: history of speed, MJ, Acid.  Last time use was 1999   Sexual activity: Not Currently    Comment: "retired" Has ED  Other Topics Concern   Not on file  Social History Narrative   Not on file   Social Determinants of Health   Financial Resource Strain: Not on file  Food Insecurity: Not on file  Transportation Needs: Not on file  Physical Activity: Not on file  Stress: Not on file  Social Connections: Not on file  Intimate Partner Violence: Not on file    Family History:  Family History  Problem Relation Age of Onset   Hypertension Mother     Arthritis Mother    Gout Mother    Hyperlipidemia Mother    Gout Father    Alcohol abuse Father        probable cirrhosis   Heart disease Brother 1       AMI   HIV/AIDS Brother    Cancer Brother        not sure what primary    Medications:   Current Outpatient Medications on File Prior to Visit  Medication Sig Dispense Refill   amLODipine (NORVASC) 10 MG tablet Take 1 tablet (10 mg total) by mouth daily. 30 tablet 1   aspirin EC 81 MG EC tablet Take 1 tablet (81 mg total) by mouth daily. Swallow whole.     atorvastatin (LIPITOR) 40 MG tablet Take 1 tablet (40 mg total) by mouth daily. 30 tablet 0   methocarbamol (ROBAXIN) 500 MG tablet Take 1 tablet (500 mg total) by mouth 2 (two) times daily as needed for muscle spasms. 30 tablet 0   mometasone (NASONEX) 50 MCG/ACT nasal spray 2 sprays each nostril daily (Patient taking differently: Place 2 sprays into the nose daily as needed (allergies).) 17 g 12   No current facility-administered medications on file prior to visit.    Allergies:  No Known Allergies    OBJECTIVE:  Physical Exam  There were no vitals filed for this visit. There is no height or weight on file to calculate BMI. No results found.  Depression screen PHQ 2/9 10/31/2018  Decreased Interest 1  Down, Depressed, Hopeless 1  PHQ - 2 Score 2  Altered sleeping 1  Tired, decreased energy 1  Change in appetite 1  Feeling bad or failure about yourself  0  Trouble concentrating 3  Moving slowly or fidgety/restless 1  Suicidal thoughts 0  PHQ-9 Score 9  Some encounter information is confidential and restricted. Go to Review Flowsheets activity to see all data.     General: well developed, well nourished, seated, in no evident distress Head: head normocephalic and atraumatic.   Neck: supple with no carotid or supraclavicular bruits Cardiovascular: regular rate and rhythm, no murmurs Musculoskeletal: no deformity Skin:  no rash/petichiae Vascular:  Normal  pulses all extremities   Neurologic Exam Mental Status: Awake and fully alert. Oriented to place and time. Recent and remote memory intact. Attention span, concentration and fund of knowledge appropriate. Mood and affect appropriate.  Cranial Nerves: Fundoscopic exam reveals sharp disc margins. Pupils equal, briskly reactive to light. Extraocular movements full without nystagmus. Visual fields full to confrontation. Hearing intact. Facial sensation intact. Face, tongue, palate moves normally and symmetrically.  Motor: Normal bulk and tone. Normal strength in all tested extremity muscles Sensory.: intact to touch ,  pinprick , position and vibratory sensation.  Coordination: Rapid alternating movements normal in all extremities. Finger-to-nose and heel-to-shin performed accurately bilaterally. Gait and Station: Arises from chair without difficulty. Stance is normal. Gait demonstrates normal stride length and balance with ***. Tandem walk and heel toe ***.  Reflexes: 1+ and symmetric. Toes downgoing.     NIHSS  *** Modified Rankin  ***      ASSESSMENT: Randy Collins is a 60 y.o. year old male with recent TIA on 10/20/2021 likely related to multiple stroke risk factors including cocaine, tobacco and THC use, HTN and HLD.     PLAN:  TIA : Residual deficit: ***. Continue aspirin 81 mg daily  and atorvastatin 40 mg daily for secondary stroke prevention.  Discussed secondary stroke prevention measures and importance of close PCP follow up for aggressive stroke risk factor management. I have gone over the pathophysiology of stroke, warning signs and symptoms, risk factors and their management in some detail with instructions to go to the closest emergency room for symptoms of concern. HTN: BP goal <130/90.  Stable on amlodipine per PCP HLD: LDL goal <70. Recent LDL 80.  Continue atorvastatin 40 mg daily     Follow up in *** or call earlier if needed   CC:  [facility] provider: Dr.  Pearlean Brownie PCP: Julieanne Manson, MD    I spent *** minutes of face-to-face and non-face-to-face time with patient.  This included previsit chart review including review of recent hospitalization, lab review, study review, order entry, electronic health record documentation, patient education regarding recent stroke including etiology, secondary stroke prevention measures and importance of managing stroke risk factors, residual deficits and typical recovery time and answered all other questions to patient satisfaction   Ihor Austin, AGNP-BC  [facility] [address] [place], [place] [address]  Phone [phone] Fax [phone] Note: This document was prepared with digital dictation and possible smart phrase technology. Any transcriptional errors that result from this process are unintentional.     Morning

## 2021-11-29 ENCOUNTER — Ambulatory Visit: Payer: Medicaid Other | Admitting: Adult Health

## 2021-11-29 ENCOUNTER — Encounter: Payer: Self-pay | Admitting: Adult Health

## 2021-11-29 VITALS — BP 148/93 | HR 69 | Ht 70.0 in | Wt 152.0 lb

## 2021-11-29 DIAGNOSIS — E785 Hyperlipidemia, unspecified: Secondary | ICD-10-CM

## 2021-11-29 DIAGNOSIS — I10 Essential (primary) hypertension: Secondary | ICD-10-CM | POA: Diagnosis not present

## 2021-11-29 DIAGNOSIS — Z720 Tobacco use: Secondary | ICD-10-CM

## 2021-11-29 DIAGNOSIS — G459 Transient cerebral ischemic attack, unspecified: Secondary | ICD-10-CM

## 2021-11-29 MED ORDER — ATORVASTATIN CALCIUM 40 MG PO TABS: 40.0000 mg | ORAL_TABLET | Freq: Every day | ORAL | 11 refills | Status: DC

## 2021-11-29 MED ORDER — ASPIRIN 81 MG PO TBEC: 81.0000 mg | DELAYED_RELEASE_TABLET | Freq: Every day | ORAL | 11 refills | Status: DC

## 2021-11-29 MED ORDER — AMLODIPINE BESYLATE 10 MG PO TABS: 10.0000 mg | ORAL_TABLET | Freq: Every day | ORAL | 11 refills | Status: DC

## 2021-11-29 NOTE — Progress Notes (Addendum)
[facility] [address] [place]. [place] [address] [phone]       HOSPITAL FOLLOW UP NOTE  [name] Date of Birth:  Nov 19, 1961 Medical Record Number:  [id number]   Reason for Referral:  hospital stroke follow up    SUBJECTIVE:   CHIEF COMPLAINT:  Chief Complaint  Patient presents with   Follow-up    Rm 3 alone Pt is well, had some chest pains and numbness on R side of face      HPI:   Randy Collins is a 59 year old male with history of hypertension, smoker and lower back pain with right sciatica who presented on 10/20/2021 admitted for right face and arm numbness and high blood pressure (SBP 220s with EMS) with right arm numbness resolved, right face only complaining mild tingling at periorbital area on the right.  Personally reviewed hospitalization pertinent progress notes, lab work and imaging.  Evaluated by Dr. Erlinda Hong.  CT no acute abnormality.  MRI no acute infarct.  MRA and prior Doppler unremarkable.  EF 60 to 65%.  LDL 80, A1c 5.4.  Creatinine 1.30.  UDS showed positive for cocaine and THC.  Etiology for symptoms per Dr. Erlinda Hong concerning for TIA in setting of stroke risk factors including cocaine use, tobacco use, THC use and HTN.  Recommended DAPT for 3 weeks and aspirin alone and initiated atorvastatin 40mg  daily.  HTN stabilized and started on amlodipine.  Polysubstance abuse cessation counseling provided. Eval by psychiatry for underlying mood disorder and advised follow-up outpatient. PT/OT no therapy needs and d/c'd home on 10/22/2021.   Today, 11/29/2021, Randy Collins is being seen for hospital follow-up unaccompanied.  Overall doing well since discharge.  Denies new stroke/TIA symptoms.  Does report continued mild right periorbital area numbness but has been gradually improving.  Denies any residual or reoccurring right arm numbness.    He has initial prescriptions from hospital discharge dated 10/30 with a few pills left in each bottle -  he does admit to missing "a few doses" as he is not used to to taking medications. He is not currently taking aspirin but has been taking plavix, atorvastatin and amlodipine (but again, not consistently)  Blood pressure today 148/93 - monitors at home and similar to today's reading  Continued tobacco use - has been able to decrease daily amt with goals of complete cessation Denies any additional recreational drug use EtOH use on occasion  He has not yet had follow-up with his PCP - he does need refills on current medications  Initially reported chest pain present since Sunday but upon further discussion, pain present right side mid abdomen to upper chest but currently more of a discomfort as gradually improving. No pain on left side of chest. No radiating pain.   He does have chronic right lumbar radiculopathy previously seen by orthopedics 03/03/2020 but has not had f/u since that time. States he was supposed to have MRI lumbar but was never called to schedule.   No further concerns at this time     PERTINENT IMAGING/LABS  Per recent hospitalization Code Stroke  CT head No acute abnormality.  Small vessel disease. Atrophy.  ASPECTS 10.     MRI  BRAIN: 1. Normal MRI appearance of the brain for age. No acute or focal lesion to explain the patient's symptoms. MRA head  Mild distal small vessel disease. 3. Moderate proximal right A1 segment stenosis. 4. No other significant proximal stenosis, aneurysm, or branch vessel occlusion. Carotid Doppler  left ICA 1-39% stenosis otherwise unremarkable  2D Echo EF 60-65% LDL 80 HgbA1c 5.4   Lipid Panel     Component Value Date/Time   CHOL 163 10/21/2021 0623   CHOL 174 11/07/2018 0950   TRIG 65 10/21/2021 0623   HDL 70 10/21/2021 0623   HDL 91 11/07/2018 0950   CHOLHDL 2.3 10/21/2021 0623   VLDL 13 10/21/2021 0623   LDLCALC 80 10/21/2021 0623   LDLCALC 73 11/07/2018 0950   LABVLDL 10 11/07/2018 0950         ROS:   14 system  review of systems performed and negative with exception of those listed in HPI  PMH:  Past Medical History:  Diagnosis Date   Arthritis 2009   likely psoriatic arthritis--inflammatory, but not symmetric, left hip and back, right elbow, both knees   Hypertension 2014   Psoriasis 2009   Right lumbar radiculopathy 2019    PSH: History reviewed. No pertinent surgical history.  Social History:  Social History   Socioeconomic History   Marital status: Single    Spouse name: Not on file   Number of children: 1   Years of education: Not on file   Highest education level: 9th grade  Occupational History   Not on file  Tobacco Use   Smoking status: Every Day    Packs/day: 1.00    Types: Cigarettes   Smokeless tobacco: Never   Tobacco comments:    getting in program at [facility] Use   Vaping Use: Never used  Substance and Sexual Activity   Alcohol use: Yes    Comment: History of abuse.  Occasional beer now.   Drug use: Not Currently    Comment: history of speed, MJ, Acid.  Last time use was 1999   Sexual activity: Not Currently    Comment: "retired" Has ED  Other Topics Concern   Not on file  Social History Narrative   Not on file   Social Determinants of Health   Financial Resource Strain: Not on file  Food Insecurity: Not on file  Transportation Needs: Not on file  Physical Activity: Not on file  Stress: Not on file  Social Connections: Not on file  Intimate Partner Violence: Not on file    Family History:  Family History  Problem Relation Age of Onset   Hypertension Mother    Arthritis Mother    Gout Mother    Hyperlipidemia Mother    Gout Father    Alcohol abuse Father        probable cirrhosis   Heart disease Brother 59       AMI   HIV/AIDS Brother    Cancer Brother        not sure what primary    Medications:   Current Outpatient Medications on File Prior to Visit  Medication Sig Dispense Refill   amLODipine (NORVASC) 10 MG tablet  Take 1 tablet (10 mg total) by mouth daily. 30 tablet 1   atorvastatin (LIPITOR) 40 MG tablet Take 1 tablet (40 mg total) by mouth daily. 30 tablet 0   clopidogrel (PLAVIX) 75 MG tablet Take 75 mg by mouth daily.     methocarbamol (ROBAXIN) 500 MG tablet Take 1 tablet (500 mg total) by mouth 2 (two) times daily as needed for muscle spasms. (Patient not taking: Reported on 11/29/2021) 30 tablet 0   No current facility-administered medications on file prior to visit.    Allergies:  No Known Allergies  OBJECTIVE:  Physical Exam  Vitals:   11/29/21 1003  BP: (!) 148/93  Pulse: 69  Weight: 152 lb (68.9 kg)  Height: 5\' 10"  (1.778 m)   Body mass index is 21.81 kg/m. No results found.  General: well developed, well nourished, very pleasant middle-aged African-American male, seated, in no evident distress Head: head normocephalic and atraumatic.   Neck: supple with no carotid or supraclavicular bruits Cardiovascular: regular rate and rhythm, no murmurs Musculoskeletal: no deformity Skin:  no rash/petichiae Vascular:  Normal pulses all extremities   Neurologic Exam Mental Status: Awake and fully alert.  Fluent speech and language.  Oriented to place and time. Recent and remote memory intact. Attention span, concentration and fund of knowledge appropriate. Mood and affect appropriate.  Cranial Nerves: Fundoscopic exam reveals sharp disc margins. Pupils equal, briskly reactive to light. Extraocular movements full without nystagmus. Visual fields full to confrontation. Hearing intact. Facial sensation intact although subjective right periorbital area numbness. Face, tongue, palate moves normally and symmetrically.  Motor: Normal bulk and tone. Normal strength in all tested extremity muscles except mild R hip flexor weakness (chronic per patient with know chronic lumbar radiculopathy) Sensory.: altered light touch sensation RLE compared to LLE otherwise intact to touch , pinprick , position  and vibratory sensation.  Coordination: Rapid alternating movements normal in all extremities. Finger-to-nose and heel-to-shin performed accurately bilaterally. Gait and Station: Arises from chair without difficulty. Stance is normal. Gait demonstrates normal stride length and balance without use of AD.  Difficulty performing tandem walk and heel toe.  Reflexes: 1+ and symmetric. Toes downgoing.     NIHSS  0 Modified Rankin  1      ASSESSMENT: Randy Collins is a 60 y.o. year old male with recent TIA on 10/20/2021 likely related to multiple stroke risk factors including cocaine, tobacco and THC use, HTN and HLD.     PLAN:  TIA :  continued mild right periorbital area numbness (also noted prior to hospital d/c) - has been gradually improving.  Advised to restart aspirin 81 mg daily  and atorvastatin 40 mg daily for secondary stroke prevention.  Refills provided until he has f/u with PCP. Advised to stop plavix at this time as prolonged DAPT not indicated Discussed importance of medication compliance and noncompliance increases risk of additional strokes.  Encouraged him to obtain pill container to better help manage medications discussed secondary stroke prevention measures and importance of close PCP follow up for aggressive stroke risk factor management. I have gone over the pathophysiology of stroke, warning signs and symptoms, risk factors and their management in some detail with instructions to go to the closest emergency room for symptoms of concern. HTN: BP goal <130/90.  Slightly elevated today - discussed importance of daily amlodipine use -refill provided and advised continued monitoring of BP at home HLD: LDL goal <70. Recent LDL 80.  Continue atorvastatin 40 mg daily - discussed importance of daily compliance. Plan to repeat lipid panel at f/u visit if not yet complete by PCP Tobacco use: Discussed importance of complete tobacco cessation as continued use greatly increases  risk of additional strokes Polysubstance use: Denies any additional cocaine or THC use. Educated on importance of continued cessation as additional use greatly increases risk of additional strokes Right lumbar radiculopathy: Encouraged him to contact his prior orthopedic office for follow-up    Follow up in 6 months or call earlier if needed   CC:  [facility] provider: Dr. 10/22/2021 PCP: Pearlean Brownie, MD  I spent 57 minutes of face-to-face and non-face-to-face time with patient.  This included previsit chart review including review of recent hospitalization, lab review, study review, order entry, electronic health record documentation, patient education regarding recent TIA including etiology, secondary stroke prevention measures and importance of managing stroke risk factors, residual deficits and typical recovery time and answered all other questions to patient satisfaction  Frann Rider, AGNP-BC  [facility] [address] [place], [place] [address]  Phone [phone] Fax [phone] Note: This document was prepared with digital dictation and possible smart phrase technology. Any transcriptional errors that result from this process are unintentional.     Morning

## 2021-11-29 NOTE — Patient Instructions (Addendum)
Continue aspirin 81 mg daily  and atorvastatin for secondary stroke prevention  Stop plavix at this time  Continue to follow up with PCP regarding cholesterol and blood pressure management  Maintain strict control of hypertension with blood pressure goal below 130/90 and cholesterol with LDL cholesterol (bad cholesterol) goal below 70 mg/dL.   Contact Dr. Lajoyce Corners to follow up on lower back issues - office number is [phone]  Highly recommend complete tobacco cession as continued use greatly increases risk of additional strokes  Continue to refrain from drug use as this also increases risk of strokes     Followup in the future with me in 6 months or call earlier if needed       Thank you for coming to see [place] at [facility]. I hope we have been able to provide you high quality care today.  You may receive a patient satisfaction survey over the next few weeks. We would appreciate your feedback and comments so that we may continue to improve ourselves and the health of our patients.   Transient Ischemic Attack A transient ischemic attack (TIA) causes the same symptoms as a stroke, but the symptoms go away quickly. A TIA happens when blood flow to the brain is blocked. Having a TIA means you may be at risk for a stroke. A TIA is a medical emergency. What are the causes? A TIA is caused by a blocked artery in the head or neck. This means the brain does not get the blood supply it needs. A blockage can be caused by: Fatty buildup in an artery in the head or neck. A blood clot. A tear in an artery. Irritation and swelling (inflammation) of an artery. Sometimes the cause is not known. What increases the risk? Certain things may make you more likely to have a TIA. Some of these are things that you can change, such as: Being very overweight. Using products that have nicotine or tobacco. Taking birth control pills. Not being active. Drinking too much  alcohol. Using drugs. Health conditions that may increase your risk include: High blood pressure. High cholesterol. Diabetes. Heart disease. A heartbeat that is not regular (atrial fibrillation). Sickle cell disease. Sleep problems (sleep apnea). Long-term diseases that cause irritation and swelling. Problems with blood clotting. Other risk factors include: Being over the age of 47. Being male. Having a family history of stroke. Having had blood clots, stroke, TIA, or heart attack in the past. Having a history of high blood pressure when pregnant (preeclampsia). Very bad headaches (migraines). What are the signs or symptoms? The symptoms of a TIA are like those of a stroke. They can include: Weakness or loss of feeling in your face, arm, or leg. This often happens on one side of your body. Trouble walking. Trouble moving your arms or legs. Trouble talking or understanding what people are saying. Problems with how you see. Feeling dizzy. Feeling confused. Loss of balance or coordination. Feeling like you may vomit (nausea) or you vomit. Having a very bad headache. If you can, note what time you started to have symptoms. Tell your doctor. How is this treated? The goal of treatment is to lower the risk for a stroke. This may include: Changes to diet and lifestyle, such as getting regular exercise and stopping smoking. Taking medicines to: Thin the blood. Lower blood pressure. Lower cholesterol. Treating other health conditions, such as diabetes. If testing shows that an artery in your brain is narrow, your doctor may recommend  a procedure to: Take the blockage out of your artery (carotid endarterectomy). Open or widen an artery in your neck (carotid angioplasty and stenting). Follow these instructions at home: Medicines Take over-the-counter and prescription medicines only as told by your doctor. If you were told to take aspirin or another medicine to thin your blood, take  it exactly as told by your doctor. Taking too much of the medicine can cause bleeding. Taking too little of the medicine may not work to treat the problem. Eating and drinking  Eat 5 or more servings of fruits and vegetables each day. Follow instructions from your doctor about your diet. You may need to follow a certain diet to help lower your risk of a stroke. You may need to: Eat a diet that is low in fat and salt. Eat foods with a lot of fiber. Limit carbohydrates and sugar. If you drink alcohol: Limit how much you have to: 0-1 drink a day for women who are not pregnant. 0-2 drinks a day for men. Know how much alcohol is in a drink. In the [place] one drink equals one 12 oz bottle of beer ( ), one 5 oz glass of wine ( ), or one 1 oz glass of hard liquor (78mL). General instructions Keep a healthy weight. Try to get at least 30 minutes of exercise on most days. Get treatment if you have sleep problems. Do not smoke or use any products that contain nicotine or tobacco. If you need help quitting, ask your doctor. Do not use drugs. Keep all follow-up visits. Where to find more information American Stroke Association: www.stroke.org Get help right away if: You have chest pain. You have a heartbeat that is not regular. You have any signs of a stroke. "BE FAST" is an easy way to remember the main warning signs: B - Balance. Dizziness, sudden trouble walking, or loss of balance. E - Eyes. Trouble seeing or a change in how you see. F - Face. Sudden weakness or loss of feeling of the face. The face or eyelid may droop on one side. A - Arms. Weakness or loss of feeling in an arm. This happens all of a sudden and most often on one side of the body. S - Speech. Sudden trouble speaking, slurred speech, or trouble understanding what people say. T - Time. Time to call emergency services. Write down what time symptoms started. You have other signs of a stroke, such as: A sudden, very bad  headache with no known cause. Feeling like you may vomit. Vomiting. A seizure. These symptoms may be an emergency. Get help right away. Call your local emergency services (911 in the [place] Do not wait to see if the symptoms will go away. Do not drive yourself to the hospital. Summary A transient ischemic attack (TIA) happens when an artery in the head or neck is blocked. This causes the same symptoms as a stroke. The symptoms go away quickly. A TIA is a medical emergency. Get help right away, even if your symptoms go away. Having a TIA means that you may be at risk for a stroke. Taking medicines and making diet and lifestyle changes can help to prevent a stroke. This information is not intended to replace advice given to you by your health care provider. Make sure you discuss any questions you have with your health care provider. Document Revised: 07/05/2020 Document Reviewed: 07/05/2020 Elsevier Patient Education  2022 [employer].

## 2021-12-01 NOTE — Progress Notes (Signed)
I agree with the above plan 

## 2022-01-15 ENCOUNTER — Ambulatory Visit: Payer: Medicaid Other | Admitting: Internal Medicine

## 2022-01-23 ENCOUNTER — Other Ambulatory Visit: Payer: Self-pay

## 2022-01-23 ENCOUNTER — Emergency Department (HOSPITAL_COMMUNITY)
Admission: EM | Admit: 2022-01-23 | Discharge: 2022-01-23 | Disposition: A | Discharge status: Home / Self Care | Payer: Medicaid Other | Attending: Emergency Medicine | Admitting: Emergency Medicine

## 2022-01-23 ENCOUNTER — Emergency Department (HOSPITAL_COMMUNITY): Payer: Medicaid Other

## 2022-01-23 ENCOUNTER — Encounter (HOSPITAL_COMMUNITY): Payer: Self-pay | Admitting: *Deleted

## 2022-01-23 DIAGNOSIS — I160 Hypertensive urgency: Secondary | ICD-10-CM | POA: Insufficient documentation

## 2022-01-23 DIAGNOSIS — Z7982 Long term (current) use of aspirin: Secondary | ICD-10-CM | POA: Diagnosis not present

## 2022-01-23 DIAGNOSIS — I10 Essential (primary) hypertension: Secondary | ICD-10-CM | POA: Diagnosis present

## 2022-01-23 DIAGNOSIS — Z79899 Other long term (current) drug therapy: Secondary | ICD-10-CM | POA: Insufficient documentation

## 2022-01-23 DIAGNOSIS — R0602 Shortness of breath: Secondary | ICD-10-CM | POA: Insufficient documentation

## 2022-01-23 DIAGNOSIS — F1410 Cocaine abuse, uncomplicated: Secondary | ICD-10-CM

## 2022-01-23 DIAGNOSIS — F1490 Cocaine use, unspecified, uncomplicated: Secondary | ICD-10-CM | POA: Diagnosis not present

## 2022-01-23 LAB — CBC
HCT: 46 % (ref 39.0–52.0)
Hemoglobin: 15.1 g/dL (ref 13.0–17.0)
MCH: 30.5 pg (ref 26.0–34.0)
MCHC: 32.8 g/dL (ref 30.0–36.0)
MCV: 92.9 fL (ref 80.0–100.0)
Platelets: 287 10*3/uL (ref 150–400)
RBC: 4.95 MIL/uL (ref 4.22–5.81)
RDW: 14.2 % (ref 11.5–15.5)
WBC: 7.6 10*3/uL (ref 4.0–10.5)
nRBC: 0 % (ref 0.0–0.2)

## 2022-01-23 LAB — BASIC METABOLIC PANEL
Anion gap: 10 (ref 5–15)
BUN: 28 mg/dL — ABNORMAL HIGH (ref 8–23)
CO2: 24 mmol/L (ref 22–32)
Calcium: 8.9 mg/dL (ref 8.9–10.3)
Chloride: 103 mmol/L (ref 98–111)
Creatinine, Ser: 1.03 mg/dL (ref 0.61–1.24)
GFR, Estimated: >60 mL/min (ref >60)
Glucose, Bld: 111 mg/dL — ABNORMAL HIGH (ref 70–99)
Potassium: 3.8 mmol/L (ref 3.5–5.1)
Sodium: 137 mmol/L (ref 135–145)

## 2022-01-23 LAB — TROPONIN I (HIGH SENSITIVITY): Troponin I (High Sensitivity): 43 ng/L — ABNORMAL HIGH (ref <18)

## 2022-01-23 MED ORDER — AMLODIPINE BESYLATE 5 MG PO TABS
10.0000 mg | ORAL_TABLET | Freq: Once | ORAL | Status: AC
  Administered 2022-01-23: 10 mg via ORAL
  Filled 2022-01-23: qty 2

## 2022-01-23 MED ORDER — ONDANSETRON 4 MG PO TBDP
8.0000 mg | ORAL_TABLET | Freq: Once | ORAL | Status: AC
Start: 2022-01-23 — End: 2022-01-23
  Administered 2022-01-23: 8 mg via ORAL
  Filled 2022-01-23: qty 2

## 2022-01-23 NOTE — ED Notes (Signed)
Pt eating and drinking. Denies nausea/vomiting.

## 2022-01-23 NOTE — ED Provider Notes (Signed)
[facility] DEPT Provider Note   CSN: [id number] Arrival date & time: 01/23/22  [id number]     History  Chief Complaint  Patient presents with   Hypertension    Randy Collins is a 61 y.o. male.  HPI    60 year old male comes in with chief complaint of elevated blood pressure and shortness of breath.  Patient has history of hypertension, indicates that he has not taken his BP medications for over a month.  He also admits to cocaine use, last use being yesterday.  Patient was feeling well around suppertime.  Later on he admits to using some cocaine.  After that he started noticing some shortness of breath, and the symptoms persisted therefore he decided to come to the ER.  Patient denies any chest pain, headaches, focal numbness or weakness, or back pain.  He denies any cough, fevers, chills.  Home Medications Prior to Admission medications   Medication Sig Start Date End Date Taking? Authorizing Provider  amLODipine (NORVASC) 10 MG tablet Take 1 tablet (10 mg total) by mouth daily. 11/29/21   Frann Rider, NP  aspirin 81 MG EC tablet Take 1 tablet (81 mg total) by mouth daily. Swallow whole. 11/29/21   Frann Rider, NP  atorvastatin (LIPITOR) 40 MG tablet Take 1 tablet (40 mg total) by mouth daily. 11/29/21   Frann Rider, NP      Allergies    Patient has no known allergies.    Review of Systems   Review of Systems  Constitutional:  Positive for activity change. Negative for appetite change.  Eyes:  Negative for visual disturbance.  Respiratory:  Positive for shortness of breath. Negative for cough.   Cardiovascular:  Negative for chest pain.  Gastrointestinal:  Negative for abdominal pain.  Neurological:  Negative for dizziness and headaches.   Physical Exam Updated Vital Signs BP 133/81    Pulse (!) 57    Temp 97.9 F (36.6 C) (Oral)    Resp 18    SpO2 98%  Physical Exam Vitals and nursing note reviewed.  Constitutional:       Appearance: He is well-developed.  HENT:     Head: Atraumatic.  Eyes:     Extraocular Movements: Extraocular movements intact.     Pupils: Pupils are equal, round, and reactive to light.  Cardiovascular:     Rate and Rhythm: Normal rate.     Comments: 2+ and equal radial pulse, dorsalis pedis pulse bilaterally Pulmonary:     Effort: Pulmonary effort is normal.  Musculoskeletal:     Cervical back: Neck supple.  Skin:    General: Skin is warm.  Neurological:     Mental Status: He is alert and oriented to person, place, and time.     Cranial Nerves: No cranial nerve deficit.     Sensory: No sensory deficit.     Motor: No weakness.     Coordination: Coordination normal.    ED Results / Procedures / Treatments   Labs (all labs ordered are listed, but only abnormal results are displayed) Labs Reviewed  BASIC METABOLIC PANEL - Abnormal; Notable for the following components:      Result Value   Glucose, Bld 111 (*)    BUN 28 (*)    All other components within normal limits  TROPONIN I (HIGH SENSITIVITY) - Abnormal; Notable for the following components:   Troponin I (High Sensitivity) 43 (*)    All other components within normal limits  CBC  TROPONIN  I (HIGH SENSITIVITY)    EKG EKG Interpretation  Date/Time:  Tuesday January 23 2022 06:46:24 EST Ventricular Rate:  64 PR Interval:  100 QRS Duration: 79 QT Interval:  438 QTC Calculation: 452 R Axis:   39 Text Interpretation: Sinus rhythm Atrial premature complex Short PR interval Biatrial enlargement Left ventricular hypertrophy Anterior ST elevation, probably due to LVH No significant change since last tracing Confirmed by Ripley Fraise [phone]) on 01/23/2022 6:54:20 AM  Radiology DG Chest 2 View  Result Date: 01/23/2022 CLINICAL DATA:  Shortness of breath and dizziness.  Hypertension. EXAM: CHEST - 2 VIEW COMPARISON:  10/12/2021 FINDINGS: Lungs are hyperexpanded. The lungs are clear without focal pneumonia, edema,  pneumothorax or pleural effusion. The cardiopericardial silhouette is within normal limits for size. The visualized bony structures of the thorax show no acute abnormality. IMPRESSION: No active cardiopulmonary disease. Electronically Signed   By: Misty Stanley M.D.   On: 01/23/2022 07:14    Procedures Procedures    Medications Ordered in ED Medications  amLODipine (NORVASC) tablet 10 mg (10 mg Oral Given 01/23/22 0739)  ondansetron (ZOFRAN-ODT) disintegrating tablet 8 mg (8 mg Oral Given 01/23/22 [id number])    ED Course/ Medical Decision Making/ A&P                           Medical Decision Making Amount and/or Complexity of Data Reviewed Labs: ordered. Radiology: ordered.  Risk Prescription drug management.   This patient presents to the ED with chief complaint(s) of shortness of breath and elevated blood pressure with pertinent past medical history of uncontrolled hypertension and cocaine use disorder which further complicates the presenting complaint. The complaint involves an extensive differential diagnosis and treatment options and also carries with it a high risk of complications and morbidity.    The differential diagnosis includes flash pulmonary edema, hypertensive emergency/hypertensive urgency, new onset CHF.  The initial plan is to get basic labs, chest x-ray, EKG.  Social Determinants of Health: Patients  substance use disorder   increases the complexity of managing their presentation  Additional history obtained:  Records reviewed  recent visit with neurology for TIA  -they focused on lifestyle changes.  Reassessment and review: Lab Tests: I Ordered, and personally interpreted labs.  The pertinent results include: Normal-appearing CBC, metabolic profile.  Creatinine is normal.  Imaging Studies ordered: I independently visualized and interpreted the following imaging X-ray of the chest   which showed does not show pulmonary edema, consistent with our exam which  revealed clear breath sounds I agree with the radiologist interpretation  Cardiac Monitoring: The patient was maintained on a cardiac monitor.  I personally viewed and interpreted the cardiac monitor which showed an underlying rhythm of:  sinus rhythm  Patient's BP is elevated, I ordered him his home 10 mg amlodipine.  We avoided beta-blocker in the setting of cocaine use last night.  Given that he is not having any chest pain, we did not give him any benzodiazepine.   Reevaluation of the patient after these medicines showed that the patient's BP    improved and his shortness of breath resolved   Complexity of problems addressed: Patients presentation is most consistent with  exacerbation of chronic illness and acute illness/injury with systemic symptoms During patient's assessment  Disposition: After consideration of the diagnostic results and the patients response to treatment,  I feel that the patent would benefit from discharge with recommendation for him picking up the BP medications that  were sent to [facility] last month.  Smoking cessation and cocaine cessation instruction/counseling given:  counseled patient on the dangers of tobacco use, advised patient to stop smoking, and reviewed strategies to maximize success    Final Clinical Impression(s) / ED Diagnoses Final diagnoses:  Hypertensive urgency  Cocaine use disorder [facility])    Rx / DC Orders ED Discharge Orders     None         Varney Biles, MD 01/23/22 1003

## 2022-01-23 NOTE — Discharge Instructions (Signed)
Your blood pressure has come down with your home medication. We suspect that the elevated blood pressure was likely a result of cocaine use and also medication noncompliance.  As discussed, please refrain from using cocaine as it will lead to severe bodily harm.  Also please start taking your blood pressure medications as soon as possible.

## 2022-01-23 NOTE — ED Triage Notes (Signed)
Pt arrives via [facility], per verbal report from medic, the pt called out for vomiting and nausea and high blood pressure. EMS had been out to the scene earlier (narcotic overdose around 2230 last night with narcan administration). En route 167/100, hr 56, SPO2 100% cbg 126. Alert and oriented.

## 2022-01-23 NOTE — ED Triage Notes (Signed)
Pt states his blood pressure is "out of control tonight" has not taken any bp medication in "31 days" reports feeling nauseated, dizzy and short of breath tonight. States he smoked something earlier tonight-which required ems to come out and administer narcan on a previous call earlier in the night.

## 2022-02-01 ENCOUNTER — Ambulatory Visit (INDEPENDENT_AMBULATORY_CARE_PROVIDER_SITE_OTHER): Payer: Medicaid Other | Admitting: Orthopedic Surgery

## 2022-02-01 ENCOUNTER — Encounter: Payer: Self-pay | Admitting: Orthopedic Surgery

## 2022-02-01 DIAGNOSIS — M5416 Radiculopathy, lumbar region: Secondary | ICD-10-CM | POA: Diagnosis not present

## 2022-02-01 MED ORDER — PREDNISONE 10 MG PO TABS: 10.0000 mg | ORAL_TABLET | Freq: Every day | ORAL | 0 refills | Status: DC

## 2022-02-03 ENCOUNTER — Encounter: Payer: Self-pay | Admitting: Orthopedic Surgery

## 2022-02-03 NOTE — Progress Notes (Signed)
Office Visit Note   Patient: Randy Collins           Date of Birth: 1960-12-26           MRN: [id number] Visit Date: 02/01/2022              Requested by: Julieanne Manson, MD [address] [place],  [place] [address] PCP: Julieanne Manson, MD  Chief Complaint  Patient presents with   Lower Back - Pain      HPI: Patient is a 61 year old gentleman who presents in follow-up for lower back pain with radiculopathy.  Patient states the pain runs down his right leg from his back.  Patient states that his back is essentially unchanged.  Assessment & Plan: Visit Diagnoses:  1. Lumbar radiculopathy     Plan: Prescription was called in for prednisone 10 mg with breakfast and a order was placed for an MRI scan of the lumbar spine  Follow-Up Instructions: Return in about 4 weeks (around 03/01/2022).   Ortho Exam  Patient is alert, oriented, no adenopathy, well-dressed, normal affect, normal respiratory effort. Examination patient has right-sided radicular symptoms with a positive straight leg raise on the right there is no focal motor weakness in either lower extremity.  No pain with range of motion of the hip knee or ankle.  Imaging: No results found. No images are attached to the encounter.  Labs: Lab Results  Component Value Date   HGBA1C 5.4 10/21/2021   ESRSEDRATE 5 11/07/2018     Lab Results  Component Value Date   ALBUMIN 3.4 (L) 10/20/2021   ALBUMIN 4.3 11/07/2018    No results found for: MG No results found for: VD25OH  No results found for: PREALBUMIN CBC EXTENDED Latest Ref Rng & Units 01/23/2022 10/20/2021 10/20/2021  WBC 4.0 - 10.5 K/uL 7.6 - 7.3  RBC 4.22 - 5.81 MIL/uL 4.95 - 5.17  HGB 13.0 - 17.0 g/dL 06.7 70.3 40.3  HCT 52.4 - 52.0 % 46.0 49.0 48.0  PLT 150 - 400 K/uL 287 - 340  NEUTROABS 1.7 - 7.7 K/uL - - 5.0  LYMPHSABS 0.7 - 4.0 K/uL - - 1.6     There is no height or weight on file to calculate BMI.  Orders:  Orders Placed This  Encounter  Procedures   MR Lumbar Spine w/o contrast   Meds ordered this encounter  Medications   predniSONE (DELTASONE) 10 MG tablet    Sig: Take 1 tablet (10 mg total) by mouth daily with breakfast.    Dispense:  30 tablet    Refill:  0     Procedures: No procedures performed  Clinical Data: No additional findings.  ROS:  All other systems negative, except as noted in the HPI. Review of Systems  Objective: Vital Signs: There were no vitals taken for this visit.  Specialty Comments:  No specialty comments available.  PMFS History: Patient Active Problem List   Diagnosis Date Noted   Hypertensive urgency 10/20/2021   Tobacco abuse 10/20/2021   Right arm numbness 10/20/2021   Elevated troponin 10/20/2021   Other bipolar disorder (HCC) 10/20/2021   Psoriasis    Right lumbar radiculopathy 12/24/2017   Hypertension 12/24/2012   Arthritis 12/25/2007   Past Medical History:  Diagnosis Date   Arthritis 2009   likely psoriatic arthritis--inflammatory, but not symmetric, left hip and back, right elbow, both knees   Hypertension 2014   Psoriasis 2009   Right lumbar radiculopathy 2019    Family  History  Problem Relation Age of Onset   Hypertension Mother    Arthritis Mother    Gout Mother    Hyperlipidemia Mother    Gout Father    Alcohol abuse Father        probable cirrhosis   Heart disease Brother 15       AMI   HIV/AIDS Brother    Cancer Brother        not sure what primary    History reviewed. No pertinent surgical history. Social History   Occupational History   Not on file  Tobacco Use   Smoking status: Every Day    Packs/day: 1.00    Types: Cigarettes   Smokeless tobacco: Never   Tobacco comments:    [facility]  Vaping Use   Vaping Use: Never used  Substance and Sexual Activity   Alcohol use: Yes    Comment: History of abuse.  Occasional beer now.   Drug use: Not Currently    Comment: history of speed, MJ, Acid.   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

## 2022-03-01 ENCOUNTER — Other Ambulatory Visit: Payer: Self-pay

## 2022-03-01 ENCOUNTER — Ambulatory Visit (INDEPENDENT_AMBULATORY_CARE_PROVIDER_SITE_OTHER): Payer: Medicaid Other | Admitting: Orthopedic Surgery

## 2022-03-01 ENCOUNTER — Telehealth: Payer: Self-pay

## 2022-03-01 DIAGNOSIS — M5416 Radiculopathy, lumbar region: Secondary | ICD-10-CM | POA: Diagnosis not present

## 2022-03-01 MED ORDER — OXYCODONE-ACETAMINOPHEN 5-325 MG PO TABS: 1.0000 | ORAL_TABLET | Freq: Three times a day (TID) | ORAL | 0 refills | Status: DC | PRN

## 2022-03-01 NOTE — Telephone Encounter (Signed)
Prior auth rec through Cover my meds but had to be entered through [place] tracks portal. This has been done and Oxycodone 5/325 has been approved for the next 90 days,  ?

## 2022-03-04 ENCOUNTER — Encounter: Payer: Self-pay | Admitting: Orthopedic Surgery

## 2022-03-04 NOTE — Progress Notes (Signed)
? ?Office Visit Note ?  ?Patient: Randy Collins           ?Date of Birth: 1961/07/26           ?MRN: [id number] ?Visit Date: 03/01/2022 ?             ?Requested by: Julieanne Manson, MD ?[address] ?[place],  [place] [address] ?PCP: Julieanne Manson, MD ? ?Chief Complaint  ?Patient presents with  ? Lower Back - Pain, Follow-up  ? ? ? ? ?HPI: ?Patient is a 61 year old gentleman who presents in follow-up for lower back pain with right-sided radicular symptoms.  Patient states the prednisone did not help.  He has an MRI scan scheduled for the 14th. ? ?Assessment & Plan: ?Visit Diagnoses:  ?1. Lumbar radiculopathy   ? ? ?Plan: Prescription was provided for Percocet will reevaluate after the MRI scan. ? ?Follow-Up Instructions: Return in about 4 weeks (around 03/29/2022).  ? ?Ortho Exam ? ?Patient is alert, oriented, no adenopathy, well-dressed, normal affect, normal respiratory effort. ?Examination patient has right-sided radicular pain that radiates to the lateral right thigh to the knee.  Patient has a negative straight leg raise and no focal motor weakness. ? ?Imaging: ?No results found. ?No images are attached to the encounter. ? ?Labs: ?Lab Results  ?Component Value Date  ? HGBA1C 5.4 10/21/2021  ? ESRSEDRATE 5 11/07/2018  ? ? ? ?Lab Results  ?Component Value Date  ? ALBUMIN 3.4 (L) 10/20/2021  ? ALBUMIN 4.3 11/07/2018  ? ? ?No results found for: MG ?No results found for: VD25OH ? ?No results found for: PREALBUMIN ?CBC EXTENDED Latest Ref Rng & Units 01/23/2022 10/20/2021 10/20/2021  ?WBC 4.0 - 10.5 K/uL 7.6 - 7.3  ?RBC 4.22 - 5.81 MIL/uL 4.95 - 5.17  ?HGB 13.0 - 17.0 g/dL 03.5 00.9 38.1  ?HCT 39.0 - 52.0 % 46.0 49.0 48.0  ?PLT 150 - 400 K/uL 287 - 340  ?NEUTROABS 1.7 - 7.7 K/uL - - 5.0  ?LYMPHSABS 0.7 - 4.0 K/uL - - 1.6  ? ? ? ?There is no height or weight on file to calculate BMI. ? ?Orders:  ?No orders of the defined types were placed in this encounter. ? ?Meds ordered this encounter  ?Medications  ?  oxyCODONE-acetaminophen (PERCOCET/ROXICET) 5-325 MG tablet  ?  Sig: Take 1 tablet by mouth every 8 (eight) hours as needed for severe pain.  ?  Dispense:  30 tablet  ?  Refill:  0  ? ? ? Procedures: ?No procedures performed ? ?Clinical Data: ?No additional findings. ? ?ROS: ? ?All other systems negative, except as noted in the HPI. ?Review of Systems ? ?Objective: ?Vital Signs: There were no vitals taken for this visit. ? ?Specialty Comments:  ?No specialty comments available. ? ?PMFS History: ?Patient Active Problem List  ? Diagnosis Date Noted  ? Hypertensive urgency 10/20/2021  ? Tobacco abuse 10/20/2021  ? Right arm numbness 10/20/2021  ? Elevated troponin 10/20/2021  ? Other bipolar disorder (HCC) 10/20/2021  ? Psoriasis   ? Right lumbar radiculopathy 12/24/2017  ? Hypertension 12/24/2012  ? Arthritis 12/25/2007  ? ?Past Medical History:  ?Diagnosis Date  ? Arthritis 2009  ? likely psoriatic arthritis--inflammatory, but not symmetric, left hip and back, right elbow, both knees  ? Hypertension 2014  ? Psoriasis 2009  ? Right lumbar radiculopathy 2019  ?  ?Family History  ?Problem Relation Age of Onset  ? Hypertension Mother   ? Arthritis Mother   ? Gout Mother   ?  Hyperlipidemia Mother   ? Gout Father   ? Alcohol abuse Father   ?     probable cirrhosis  ? Heart disease Brother 34  ?     AMI  ? HIV/AIDS Brother   ? Cancer Brother   ?     not sure what primary  ?  ?History reviewed. No pertinent surgical history. ?Social History  ? ?Occupational History  ? Not on file  ?Tobacco Use  ? Smoking status: Every Day  ?  Packs/day: 1.00  ?  Types: Cigarettes  ? Smokeless tobacco: Never  ? Tobacco comments:  ?  [facility] at [facility]  ?Vaping Use  ? Vaping Use: Never used  ?Substance and Sexual Activity  ? Alcohol use: Yes  ?  Comment: History of abuse.  Occasional beer now.  ? Drug use: Not Currently  ?  Comment: history of speed, MJ, Acid.  Last time use was 1999  ? Sexual activity: Not Currently  ?   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 ? ? ? ? ?

## 2022-03-06 ENCOUNTER — Other Ambulatory Visit: Payer: Medicaid Other

## 2022-03-08 ENCOUNTER — Other Ambulatory Visit: Payer: Self-pay

## 2022-03-08 ENCOUNTER — Ambulatory Visit
Admission: RE | Admit: 2022-03-08 | Discharge: 2022-03-08 | Disposition: A | Discharge status: Home / Self Care | Payer: Medicaid Other | Source: Ambulatory Visit | Attending: Orthopedic Surgery | Admitting: Orthopedic Surgery

## 2022-03-13 ENCOUNTER — Telehealth: Payer: Self-pay

## 2022-03-13 ENCOUNTER — Other Ambulatory Visit: Payer: Self-pay

## 2022-03-13 DIAGNOSIS — M5416 Radiculopathy, lumbar region: Secondary | ICD-10-CM

## 2022-03-13 NOTE — Telephone Encounter (Signed)
Noted and cancelled.

## 2022-03-13 NOTE — Telephone Encounter (Signed)
Can you please cancel the referral for FN? The pt wants a referral to Dr. Lorin Mercy to discuss surgery dos not want an ESI. Thanks! ?

## 2022-03-15 ENCOUNTER — Ambulatory Visit: Payer: Medicaid Other | Admitting: Orthopedic Surgery

## 2022-03-16 ENCOUNTER — Encounter: Payer: Self-pay | Admitting: Orthopaedic Surgery

## 2022-03-16 ENCOUNTER — Other Ambulatory Visit: Payer: Self-pay

## 2022-03-16 ENCOUNTER — Ambulatory Visit (INDEPENDENT_AMBULATORY_CARE_PROVIDER_SITE_OTHER): Payer: Medicaid Other | Admitting: Orthopaedic Surgery

## 2022-03-16 VITALS — BP 145/76 | Ht 69.0 in | Wt 151.4 lb

## 2022-03-16 DIAGNOSIS — M5416 Radiculopathy, lumbar region: Secondary | ICD-10-CM | POA: Diagnosis not present

## 2022-03-16 DIAGNOSIS — M5126 Other intervertebral disc displacement, lumbar region: Secondary | ICD-10-CM | POA: Diagnosis not present

## 2022-03-19 DIAGNOSIS — M5126 Other intervertebral disc displacement, lumbar region: Secondary | ICD-10-CM | POA: Insufficient documentation

## 2022-03-19 NOTE — Progress Notes (Signed)
? ?Office Visit Note ?  ?Patient: Randy Collins           ?Date of Birth: May 04, 1961           ?MRN: [id number] ?Visit Date: 03/16/2022 ?             ?Requested by: Julieanne Manson, MD ?[address] ?[place],  [place] [address] ?PCP: Julieanne Manson, MD ? ? ?Assessment & Plan: ?Visit Diagnoses:  ?1. Right lumbar radiculopathy   ?2. Protrusion of lumbar intervertebral disc   ? ? ?Plan: Long discussion we reviewed his MRI scan.  I recommend he go ahead and have single epidural and then he can follow-up.  He has had some back pain symptoms off and on for 6 years but recently more symptoms.  We discussed single level fusion.  He smokes a few cigarettes a day and we discussed smoking sensation.  With his foraminal stenosis he likely would require instrumented fusion due to his bilateral foraminal stenosis at L4-5 where he also has anterolisthesis with degenerative facets. ? ?Follow-Up Instructions: Return in about 2 months (around 05/16/2022).  ? ?Orders:  ?No orders of the defined types were placed in this encounter. ? ?No orders of the defined types were placed in this encounter. ? ? ? ? Procedures: ?No procedures performed ? ? ?Clinical Data: ?No additional findings. ? ? ?Subjective: ?Chief Complaint  ?Patient presents with  ? Lower Back - Pain  ? ? ?HPI 61 year old male returns with ongoing problems with back pain.  He had seen Dr. Lajoyce Corners and has asked to talk with me about scheduling lumbar surgery.  He has worked at [employer] doing [employer] but has not done that in 3 months.  He has had back pain that radiates into his legs sometimes to his toes with numbness and tingling sometimes the leg feels weak and some days he has had more trouble walking.  MRI scan 03/09/2022 showed prominent bilateral foraminal stenosis at L4-5 moderate to prominent on the left and moderate right subarticular lateral recess stenosis moderate central stenosis with disc bulge and left paracentral disc protrusion.  Some moderate  narrowing at L5-S1 mild narrowing L3-4.  Full MRI report is listed below.  He has not had an epidural injection.  He has some days he has trouble standing for long periods of time other days he can stand much longer and can walk sometimes many blocks sometimes only  1 block it tends to wax and wane. ? ?Review of Systems patient's been diagnosed with the psoriasis bipolar disorder hypertension.   ? ? ?Objective: ?Vital Signs: BP (!) 145/76   Ht 5\' 9"  (1.753 m)   Wt 151 lb 6.4 oz (68.7 kg)   BMI 22.36 kg/m?  ? ?Physical Exam ?Constitutional:   ?   Appearance: He is well-developed.  ?HENT:  ?   Head: Normocephalic and atraumatic.  ?   Right Ear: External ear normal.  ?   Left Ear: External ear normal.  ?Eyes:  ?   Pupils: Pupils are equal, round, and reactive to light.  ?Neck:  ?   Thyroid: No thyromegaly.  ?   Trachea: No tracheal deviation.  ?Cardiovascular:  ?   Rate and Rhythm: Normal rate.  ?Pulmonary:  ?   Effort: Pulmonary effort is normal.  ?   Breath sounds: No wheezing.  ?Abdominal:  ?   General: Bowel sounds are normal.  ?   Palpations: Abdomen is soft.  ?Musculoskeletal:  ?   Cervical back:  Neck supple.  ?Skin: ?   General: Skin is warm and dry.  ?   Capillary Refill: Capillary refill takes less than 2 seconds.  ?Neurological:  ?   Mental Status: He is alert and oriented to person, place, and time.  ?Psychiatric:     ?   Behavior: Behavior normal.     ?   Thought Content: Thought content normal.     ?   Judgment: Judgment normal.  ? ? ?Ortho Exam patient has some sciatic notch tenderness is able to heel and toe walk.  Anterior tib gastrocsoleus is strong.  Sensory testing is normal. ? ?Specialty Comments:  ?No specialty comments available. ? ?Imaging: ?Narrative & Impression  ?CLINICAL DATA:  Low back pain for greater than 6 weeks. ?  ?EXAM: ?MRI LUMBAR SPINE WITHOUT CONTRAST ?  ?TECHNIQUE: ?Multiplanar, multisequence MR imaging of the lumbar spine was ?performed. No intravenous contrast was  administered. ?  ?COMPARISON:  05/25/2021 radiographs ?  ?FINDINGS: ?Segmentation: The lowest lumbar type non-rib-bearing vertebra is ?labeled as L5. ?  ?Alignment:  5 mm degenerative anterolisthesis at L4-5. ?  ?Vertebrae: Disc desiccation at L4-5. Small bilateral facet joint ?effusions at the L4-5 level. ?  ?Conus medullaris and cauda equina: Conus extends to the L1 level. ?Conus and cauda equina appear normal. ?  ?Paraspinal and other soft tissues: Unremarkable ?  ?Disc levels: ?  ?L1-2: Unremarkable. ?  ?L2-3: No impingement.  Mild left degenerative facet arthropathy. ?  ?L3-4: Mild left and borderline right foraminal stenosis along with ?mild left subarticular lateral recess stenosis due to degenerative ?facet arthropathy and mild disc bulge. ?  ?L4-5: Moderate to prominent bilateral foraminal stenosis, moderate ?to prominent left and moderate right subarticular lateral recess ?stenosis, and moderate central narrowing of the thecal sac due to ?disc uncovering, disc bulge, left paracentral disc protrusion, and ?degenerative facet arthropathy. ?  ?L5-S1: Moderate left and mild right foraminal stenosis with ?borderline left subarticular lateral recess stenosis due to disc ?bulge and facet arthropathy. ?  ?IMPRESSION: ?1. Lumbar spondylosis and degenerative disc disease, causing ?moderate to prominent impingement at L4-5; moderate impingement at ?L5-S1; and mild impingement at L3-4, as detailed above. ?2. Small bilateral facet joint effusions at L4-5. ?3. 5 mm degenerative anterolisthesis at L4-5. ?  ?  ?Electronically Signed ?  By: Gaylyn Rong M.D. ?  On: 03/09/2022 16:40 ?   ? ? ? ?PMFS History: ?Patient Active Problem List  ? Diagnosis Date Noted  ? Protrusion of lumbar intervertebral disc 03/19/2022  ? Hypertensive urgency 10/20/2021  ? Tobacco abuse 10/20/2021  ? Right arm numbness 10/20/2021  ? Elevated troponin 10/20/2021  ? Other bipolar disorder (HCC) 10/20/2021  ? Psoriasis   ? Right lumbar  radiculopathy 12/24/2017  ? Hypertension 12/24/2012  ? Arthritis 12/25/2007  ? ?Past Medical History:  ?Diagnosis Date  ? Arthritis 2009  ? likely psoriatic arthritis--inflammatory, but not symmetric, left hip and back, right elbow, both knees  ? Hypertension 2014  ? Psoriasis 2009  ? Right lumbar radiculopathy 2019  ?  ?Family History  ?Problem Relation Age of Onset  ? Hypertension Mother   ? Arthritis Mother   ? Gout Mother   ? Hyperlipidemia Mother   ? Gout Father   ? Alcohol abuse Father   ?     probable cirrhosis  ? Heart disease Brother 55  ?     AMI  ? HIV/AIDS Brother   ? Cancer Brother   ?     not  sure what primary  ?  ?No past surgical history on file. ?Social History  ? ?Occupational History  ? Not on file  ?Tobacco Use  ? Smoking status: Every Day  ?  Packs/day: 1.00  ?  Types: Cigarettes  ? Smokeless tobacco: Never  ? Tobacco comments:  ?  getting in program at [facility]  ?Vaping Use  ? Vaping Use: Never used  ?Substance and Sexual Activity  ? Alcohol use: Yes  ?  Comment: History of abuse.  Occasional beer now.  ? Drug use: Not Currently  ?  Comment: history of speed, MJ, Acid.  Last time use was 1999  ? Sexual activity: Not Currently  ?  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 ? ? ? ? ? ?

## 2022-05-30 ENCOUNTER — Encounter: Payer: Self-pay | Admitting: Adult Health

## 2022-05-30 ENCOUNTER — Ambulatory Visit: Payer: Medicaid Other | Admitting: Adult Health

## 2022-05-30 NOTE — Progress Notes (Deleted)
[facility] [address] [place]. [place] [id number] [phone]       STROKE FOLLOW UP NOTE  [name] Date of Birth:  03/07/1961 Medical Record Number:  [id number]   Reason for Referral: stroke follow up    SUBJECTIVE:   CHIEF COMPLAINT:  No chief complaint on file.    HPI:   Update 05/30/2022 JM: Patient returns for 1384-month TIA follow-up.  Overall stable from stroke standpoint without new or reoccurring stroke/TIA symptoms.  Compliant on aspirin and atorvastatin, denies side effects.  Blood pressure today ***.  Tobacco use ***.  THC or cocaine use ***.  Follow-up with PCP ***.          History provided for reference purposes only Initial visit 11/29/2021 JM: Randy Collins is being seen for hospital follow-up unaccompanied.  Overall doing well since discharge.  Denies new stroke/TIA symptoms.  Does report continued mild right periorbital area numbness but has been gradually improving.  Denies any residual or reoccurring right arm numbness.    He has initial prescriptions from hospital discharge dated 10/30 with a few pills left in each bottle - he does admit to missing "a few doses" as he is not used to to taking medications. He is not currently taking aspirin but has been taking plavix, atorvastatin and amlodipine (but again, not consistently)  Blood pressure today 148/93 - monitors at home and similar to today's reading  Continued tobacco use - has been able to decrease daily amt with goals of complete cessation Denies any additional recreational drug use EtOH use on occasion  He has not yet had follow-up with his PCP - he does need refills on current medications  Initially reported chest pain present since Sunday but upon further discussion, pain present right side mid abdomen to upper chest but currently more of a discomfort as gradually improving. No pain on left side of chest. No radiating pain.   He does have chronic right lumbar  radiculopathy previously seen by orthopedics 03/03/2020 but has not had f/u since that time. States he was supposed to have MRI lumbar but was never called to schedule.   No further concerns at this time  Stroke admission 10/20/2021 Randy Collins is a 61 year old male with history of hypertension, smoker and lower back pain with right sciatica who presented on 10/20/2021 admitted for right face and arm numbness and high blood pressure (SBP 220s with EMS) with right arm numbness resolved, right face only complaining mild tingling at periorbital area on the right.  Personally reviewed hospitalization pertinent progress notes, lab work and imaging.  Evaluated by Dr. Roda ShuttersXu.  CT no acute abnormality.  MRI no acute infarct.  MRA and prior Doppler unremarkable.  EF 60 to 65%.  LDL 80, A1c 5.4.  Creatinine 1.30.  UDS showed positive for cocaine and THC.  Etiology for symptoms per Dr. Roda ShuttersXu concerning for TIA in setting of stroke risk factors including cocaine use, tobacco use, THC use and HTN.  Recommended DAPT for 3 weeks and aspirin alone and initiated atorvastatin 40mg  daily.  HTN stabilized and started on amlodipine.  Polysubstance abuse cessation counseling provided. Eval by psychiatry for underlying mood disorder and advised follow-up outpatient. PT/OT no therapy needs and d/c'd home on 10/22/2021.      PERTINENT IMAGING/LABS  Per recent hospitalization Code Stroke  CT head No acute abnormality.  Small vessel disease. Atrophy.  ASPECTS 10.     MRI  BRAIN: 1. Normal MRI appearance of the  brain for age. No acute or focal lesion to explain the patient's symptoms. MRA head  Mild distal small vessel disease. 3. Moderate proximal right A1 segment stenosis. 4. No other significant proximal stenosis, aneurysm, or branch vessel occlusion. Carotid Doppler   left ICA 1-39% stenosis otherwise unremarkable  2D Echo EF 60-65% LDL 80 HgbA1c 5.4   Lipid Panel     Component Value Date/Time   CHOL 163 10/21/2021  0623   CHOL 174 11/07/2018 0950   TRIG 65 10/21/2021 0623   HDL 70 10/21/2021 0623   HDL 91 11/07/2018 0950   CHOLHDL 2.3 10/21/2021 0623   VLDL 13 10/21/2021 0623   LDLCALC 80 10/21/2021 0623   LDLCALC 73 11/07/2018 0950   LABVLDL 10 11/07/2018 0950         ROS:   14 system review of systems performed and negative with exception of those listed in HPI  PMH:  Past Medical History:  Diagnosis Date   Arthritis 2009   likely psoriatic arthritis--inflammatory, but not symmetric, left hip and back, right elbow, both knees   Hypertension 2014   Psoriasis 2009   Right lumbar radiculopathy 2019    PSH: No past surgical history on file.  Social History:  Social History   Socioeconomic History   Marital status: Single    Spouse name: Not on file   Number of children: 1   Years of education: Not on file   Highest education level: 9th grade  Occupational History   Not on file  Tobacco Use   Smoking status: Every Day    Packs/day: 1.00    Types: Cigarettes   Smokeless tobacco: Never   Tobacco comments:    [facility]  Vaping Use   Vaping Use: Never used  Substance and Sexual Activity   Alcohol use: Yes    Comment: History of abuse.  Occasional beer now.   Drug use: Not Currently    Comment: history of speed, MJ, Acid.  Last time use was 1999   Sexual activity: Not Currently    Comment: "retired" Has ED  Other Topics Concern   Not on file  Social History Narrative   Not on file   Social Determinants of Health   Financial Resource Strain: Not on file  Food Insecurity: Not on file  Transportation Needs: Not on file  Physical Activity: Not on file  Stress: Not on file  Social Connections: Not on file  Intimate Partner Violence: Not on file    Family History:  Family History  Problem Relation Age of Onset   Hypertension Mother    Arthritis Mother    Gout Mother    Hyperlipidemia Mother    Gout Father    Alcohol abuse Father         probable cirrhosis   Heart disease Brother 65       AMI   HIV/AIDS Brother    Cancer Brother        not sure what primary    Medications:   Current Outpatient Medications on File Prior to Visit  Medication Sig Dispense Refill   amLODipine (NORVASC) 10 MG tablet Take 1 tablet (10 mg total) by mouth daily. 30 tablet 11   aspirin 81 MG EC tablet Take 1 tablet (81 mg total) by mouth daily. Swallow whole. 30 tablet 11   atorvastatin (LIPITOR) 40 MG tablet Take 1 tablet (40 mg total) by mouth daily. 30 tablet 11   oxyCODONE-acetaminophen (PERCOCET/ROXICET) 5-325  MG tablet Take 1 tablet by mouth every 8 (eight) hours as needed for severe pain. (Patient not taking: Reported on 03/16/2022) 30 tablet 0   predniSONE (DELTASONE) 10 MG tablet Take 1 tablet (10 mg total) by mouth daily with breakfast. (Patient not taking: Reported on 03/16/2022) 30 tablet 0   No current facility-administered medications on file prior to visit.    Allergies:  No Known Allergies    OBJECTIVE:  Physical Exam  There were no vitals filed for this visit.  There is no height or weight on file to calculate BMI. No results found.  General: well developed, well nourished, very pleasant middle-aged African-American male, seated, in no evident distress Head: head normocephalic and atraumatic.   Neck: supple with no carotid or supraclavicular bruits Cardiovascular: regular rate and rhythm, no murmurs Musculoskeletal: no deformity Skin:  no rash/petichiae Vascular:  Normal pulses all extremities   Neurologic Exam Mental Status: Awake and fully alert.  Fluent speech and language.  Oriented to place and time. Recent and remote memory intact. Attention span, concentration and fund of knowledge appropriate. Mood and affect appropriate.  Cranial Nerves: Pupils equal, briskly reactive to light. Extraocular movements full without nystagmus. Visual fields full to confrontation. Hearing intact. Facial sensation intact  although subjective right periorbital area numbness. Face, tongue, palate moves normally and symmetrically.  Motor: Normal bulk and tone. Normal strength in all tested extremity muscles except mild R hip flexor weakness (chronic per patient with know chronic lumbar radiculopathy) Sensory.: altered light touch sensation RLE compared to LLE otherwise intact to touch , pinprick , position and vibratory sensation.  Coordination: Rapid alternating movements normal in all extremities. Finger-to-nose and heel-to-shin performed accurately bilaterally. Gait and Station: Arises from chair without difficulty. Stance is normal. Gait demonstrates normal stride length and balance without use of AD.  Difficulty performing tandem walk and heel toe.  Reflexes: 1+ and symmetric. Toes downgoing.         ASSESSMENT: Randy Collins is a 61 y.o. year old male with recent TIA on 10/20/2021 likely related to multiple stroke risk factors including cocaine, tobacco and THC use, HTN and HLD.     PLAN:  TIA :  continued mild right periorbital area numbness (also noted prior to hospital d/c) - has been gradually improving.  Advised to restart aspirin 81 mg daily  and atorvastatin 40 mg daily for secondary stroke prevention.  Refills provided until he has f/u with PCP. Advised to stop plavix at this time as prolonged DAPT not indicated Discussed importance of medication compliance and noncompliance increases risk of additional strokes.  Encouraged him to obtain pill container to better help manage medications discussed secondary stroke prevention measures and importance of close PCP follow up for aggressive stroke risk factor management including BP goal<130/90, and HLD with LDL goal<70 .  Stroke labs 09/2021: LDL 80, A1c 5.4 -repeat lab work today *** I have gone over the pathophysiology of stroke, warning signs and symptoms, risk factors and their management in some detail with instructions to go to the closest  emergency room for symptoms of concern.  Tobacco use: Discussed importance of complete tobacco cessation as continued use greatly increases risk of additional strokes Polysubstance use: Denies any additional cocaine or THC use. Educated on importance of continued cessation as additional use greatly increases risk of additional strokes     Follow up in 6 months or call earlier if needed   CC:  PCP: Julieanne Manson, MD    I spent  57 minutes of face-to-face and non-face-to-face time with patient.  This included previsit chart review, lab review, study review, order entry, electronic health record documentation, patient education regarding prior TIA including etiology, secondary stroke prevention measures and importance of managing stroke risk factors, residual deficits and typical recovery time and answered all other questions to patient satisfaction  Ihor Austin, AGNP-BC  [facility] [address] [place], [place] [address]  Phone [phone] Fax [phone] Note: This document was prepared with digital dictation and possible smart phrase technology. Any transcriptional errors that result from this process are unintentional.     Morning

## 2022-06-11 ENCOUNTER — Telehealth: Payer: Self-pay

## 2022-06-11 ENCOUNTER — Telehealth: Payer: Self-pay | Admitting: Physical Medicine and Rehabilitation

## 2022-06-11 NOTE — Telephone Encounter (Signed)
Please call the pt to schedule with Dr Alvester Morin

## 2022-06-11 NOTE — Telephone Encounter (Signed)
Patient called and would like a refill on his oxycodone please advise

## 2022-06-11 NOTE — Telephone Encounter (Signed)
See below and advise. Thank you

## 2022-06-13 NOTE — Telephone Encounter (Signed)
I called pt and advised could not give rx at this time. He is sch for an ESI with [facility] next month

## 2022-06-21 ENCOUNTER — Ambulatory Visit: Payer: Medicaid Other | Admitting: Podiatry

## 2022-06-29 ENCOUNTER — Other Ambulatory Visit: Payer: Self-pay | Admitting: Internal Medicine

## 2022-06-30 LAB — CBC
HCT: 36.6 % — ABNORMAL LOW (ref 38.5–50.0)
Hemoglobin: 12 g/dL — ABNORMAL LOW (ref 13.2–17.1)
MCH: 30.2 pg (ref 27.0–33.0)
MCHC: 32.8 g/dL (ref 32.0–36.0)
MCV: 92.2 fL (ref 80.0–100.0)
MPV: 9.6 fL (ref 7.5–12.5)
Platelets: 282 10*3/uL (ref 140–400)
RBC: 3.97 10*6/uL — ABNORMAL LOW (ref 4.20–5.80)
RDW: 14.2 % (ref 11.0–15.0)
WBC: 4.7 10*3/uL (ref 3.8–10.8)

## 2022-06-30 LAB — COMPLETE METABOLIC PANEL WITH GFR
AG Ratio: 1.9 (calc) (ref 1.0–2.5)
ALT: 8 U/L — ABNORMAL LOW (ref 9–46)
AST: 12 U/L (ref 10–35)
Albumin: 3.8 g/dL (ref 3.6–5.1)
Alkaline phosphatase (APISO): 54 U/L (ref 35–144)
BUN: 23 mg/dL (ref 7–25)
CO2: 22 mmol/L (ref 20–32)
Calcium: 8.6 mg/dL (ref 8.6–10.3)
Chloride: 112 mmol/L — ABNORMAL HIGH (ref 98–110)
Creat: 1.18 mg/dL (ref 0.70–1.35)
Globulin: 2 g/dL (calc) (ref 1.9–3.7)
Glucose, Bld: 91 mg/dL (ref 65–99)
Potassium: 4.7 mmol/L (ref 3.5–5.3)
Sodium: 141 mmol/L (ref 135–146)
Total Bilirubin: 0.3 mg/dL (ref 0.2–1.2)
Total Protein: 5.8 g/dL — ABNORMAL LOW (ref 6.1–8.1)
eGFR: 70 mL/min/{1.73_m2} (ref >=60)

## 2022-06-30 LAB — LIPID PANEL
Cholesterol: 153 mg/dL (ref <200)
HDL: 69 mg/dL (ref >=40)
LDL Cholesterol (Calc): 73 mg/dL (calc)
Non-HDL Cholesterol (Calc): 84 mg/dL (calc) (ref <130)
Total CHOL/HDL Ratio: 2.2 (calc) (ref <5.0)
Triglycerides: 40 mg/dL (ref <150)

## 2022-06-30 LAB — VITAMIN D 25 HYDROXY (VIT D DEFICIENCY, FRACTURES): Vit D, 25-Hydroxy: 22 ng/mL — ABNORMAL LOW (ref 30–100)

## 2022-06-30 LAB — TSH: TSH: 0.7 mIU/L (ref 0.40–4.50)

## 2022-06-30 LAB — PSA: PSA: 0.84 ng/mL (ref <=4.00)

## 2022-07-16 ENCOUNTER — Encounter: Payer: Medicaid Other | Admitting: Physical Medicine and Rehabilitation

## 2022-08-14 ENCOUNTER — Ambulatory Visit: Payer: Self-pay

## 2022-08-14 ENCOUNTER — Encounter: Payer: Self-pay | Admitting: Physical Medicine and Rehabilitation

## 2022-08-14 ENCOUNTER — Ambulatory Visit (INDEPENDENT_AMBULATORY_CARE_PROVIDER_SITE_OTHER): Payer: Medicaid Other | Admitting: Physical Medicine and Rehabilitation

## 2022-08-14 VITALS — BP 148/85 | HR 73

## 2022-08-14 DIAGNOSIS — M5416 Radiculopathy, lumbar region: Secondary | ICD-10-CM | POA: Diagnosis not present

## 2022-08-14 MED ORDER — METHYLPREDNISOLONE ACETATE 80 MG/ML IJ SUSP
40.0000 mg | Freq: Once | INTRAMUSCULAR | Status: AC
  Administered 2022-08-14: 40 mg

## 2022-08-14 NOTE — Patient Instructions (Signed)

## 2022-08-14 NOTE — Progress Notes (Signed)
Pt state lower back pain that travels down his right leg. Pt state walking, standing and laying down makes the pain worse. Pt state he take [place] meds and uses ice to help ease his pain.  Numeric Pain Rating Scale and Functional Assessment Average Pain 7   In the last MONTH (on 0-10 scale) has pain interfered with the following?  1. General activity like being  able to carry out your everyday physical activities such as walking, climbing stairs, carrying groceries, or moving a chair?  Rating(10)   +Driver, -BT, -Dye Allergies.

## 2022-08-23 NOTE — Progress Notes (Signed)
Randy Collins - 61 y.o. male MRN [id number]  Date of birth: May 10, 1961  Office Visit Note: Visit Date: 08/14/2022 PCP: Julieanne Manson, MD Referred by: Julieanne Manson, MD  Subjective: Chief Complaint  Patient presents with   Lower Back - Pain   Right Leg - Pain   HPI:  Randy Collins is a 61 y.o. male who comes in today at the request of Dr. Aldean Baker for planned Right L4-5 Lumbar Interlaminar epidural steroid injection with fluoroscopic guidance.  The patient has failed conservative care including home exercise, medications, time and activity modification.  This injection will be diagnostic and hopefully therapeutic.  Please see requesting physician notes for further details and justification.   ROS Otherwise per HPI.  Assessment & Plan: Visit Diagnoses:    ICD-10-CM   1. Lumbar radiculopathy  M54.16 XR C-ARM NO REPORT    Epidural Steroid injection    methylPREDNISolone acetate (DEPO-MEDROL) injection 40 mg      Plan: No additional findings.   Meds & Orders:  Meds ordered this encounter  Medications   methylPREDNISolone acetate (DEPO-MEDROL) injection 40 mg    Orders Placed This Encounter  Procedures   XR C-ARM NO REPORT   Epidural Steroid injection    Follow-up: Return for visit to requesting provider as needed.   Procedures: No procedures performed  Lumbar Epidural Steroid Injection - Interlaminar Approach with Fluoroscopic Guidance  Patient: Randy Collins      Date of Birth: May 19, 1961 MRN: [id number] PCP: Julieanne Manson, MD      Visit Date: 08/14/2022   Universal Protocol:     Consent Given By: the patient  Position: PRONE  Additional Comments: Vital signs were monitored before and after the procedure. Patient was prepped and draped in the usual sterile fashion. The correct patient, procedure, and site was verified.   Injection Procedure Details:   Procedure diagnoses: Lumbar radiculopathy [M54.16]   Meds Administered:   Meds ordered this encounter  Medications   methylPREDNISolone acetate (DEPO-MEDROL) injection 40 mg     Laterality: Right  Location/Site:  L4-5  Needle: 3.5 in., 20 ga. Tuohy  Needle Placement: Paramedian epidural  Findings:   -Comments: Excellent flow of contrast into the epidural space.  Procedure Details: Using a paramedian approach from the side mentioned above, the region overlying the inferior lamina was localized under fluoroscopic visualization and the soft tissues overlying this structure were infiltrated with 4 ml. of 1% Lidocaine without Epinephrine. The Tuohy needle was inserted into the epidural space using a paramedian approach.   The epidural space was localized using loss of resistance along with counter oblique bi-planar fluoroscopic views.  After negative aspirate for air, blood, and CSF, a 2 ml. volume of Isovue-250 was injected into the epidural space and the flow of contrast was observed. Radiographs were obtained for documentation purposes.    The injectate was administered into the level noted above.   Additional Comments:  The patient tolerated the procedure well Dressing: 2 x 2 sterile gauze and Band-Aid    Post-procedure details: Patient was observed during the procedure. Post-procedure instructions were reviewed.  Patient left the clinic in stable condition.   Clinical History: No specialty comments available.     Objective:  VS:  HT:    WT:   BMI:     BP:(!) 148/85  HR:73bpm  TEMP: ( )  RESP:  Physical Exam Vitals and nursing note reviewed.  Constitutional:      General: He is not in acute  distress.    Appearance: Normal appearance. He is not ill-appearing.  HENT:     Head: Normocephalic and atraumatic.     Right Ear: External ear normal.     Left Ear: External ear normal.     Nose: No congestion.  Eyes:     Extraocular Movements: Extraocular movements intact.  Cardiovascular:     Rate and Rhythm: Normal rate.     Pulses:  Normal pulses.  Pulmonary:     Effort: Pulmonary effort is normal. No respiratory distress.  Abdominal:     General: There is no distension.     Palpations: Abdomen is soft.  Musculoskeletal:        General: No tenderness or signs of injury.     Cervical back: Neck supple.     Right lower leg: No edema.     Left lower leg: No edema.     Comments: Patient has good distal strength without clonus.  Skin:    Findings: No erythema or rash.  Neurological:     General: No focal deficit present.     Mental Status: He is alert and oriented to person, place, and time.     Sensory: No sensory deficit.     Motor: No weakness or abnormal muscle tone.     Coordination: Coordination normal.  Psychiatric:        Mood and Affect: Mood normal.        Behavior: Behavior normal.      Imaging: No results found.

## 2022-08-23 NOTE — Procedures (Signed)
Lumbar Epidural Steroid Injection - Interlaminar Approach with Fluoroscopic Guidance  Patient: Randy Collins      Date of Birth: [birth year] MRN: [id number] PCP: Julieanne Manson, MD      Visit Date: 08/14/2022   Universal Protocol:     Consent Given By: the patient  Position: PRONE  Additional Comments: Vital signs were monitored before and after the procedure. Patient was prepped and draped in the usual sterile fashion. The correct patient, procedure, and site was verified.   Injection Procedure Details:   Procedure diagnoses: Lumbar radiculopathy [M54.16]   Meds Administered:  Meds ordered this encounter  Medications   methylPREDNISolone acetate (DEPO-MEDROL) injection 40 mg     Laterality: Right  Location/Site:  L4-5  Needle: 3.5 in., 20 ga. Tuohy  Needle Placement: Paramedian epidural  Findings:   -Comments: Excellent flow of contrast into the epidural space.  Procedure Details: Using a paramedian approach from the side mentioned above, the region overlying the inferior lamina was localized under fluoroscopic visualization and the soft tissues overlying this structure were infiltrated with 4 ml. of 1% Lidocaine without Epinephrine. The Tuohy needle was inserted into the epidural space using a paramedian approach.   The epidural space was localized using loss of resistance along with counter oblique bi-planar fluoroscopic views.  After negative aspirate for air, blood, and CSF, a 2 ml. volume of Isovue-250 was injected into the epidural space and the flow of contrast was observed. Radiographs were obtained for documentation purposes.    The injectate was administered into the level noted above.   Additional Comments:  The patient tolerated the procedure well Dressing: 2 x 2 sterile gauze and Band-Aid    Post-procedure details: Patient was observed during the procedure. Post-procedure instructions were reviewed.  Patient left the clinic in stable  condition.

## 2022-09-05 ENCOUNTER — Encounter: Payer: Self-pay | Admitting: Orthopaedic Surgery

## 2022-09-05 ENCOUNTER — Ambulatory Visit (INDEPENDENT_AMBULATORY_CARE_PROVIDER_SITE_OTHER): Payer: Medicaid Other | Admitting: Orthopaedic Surgery

## 2022-09-05 VITALS — BP 165/95 | HR 56 | Ht 69.0 in | Wt 160.0 lb

## 2022-09-05 DIAGNOSIS — M48061 Spinal stenosis, lumbar region without neurogenic claudication: Secondary | ICD-10-CM | POA: Insufficient documentation

## 2022-09-05 DIAGNOSIS — M5126 Other intervertebral disc displacement, lumbar region: Secondary | ICD-10-CM | POA: Diagnosis not present

## 2022-09-05 DIAGNOSIS — M48062 Spinal stenosis, lumbar region with neurogenic claudication: Secondary | ICD-10-CM

## 2022-09-05 NOTE — Progress Notes (Signed)
Office Visit Note   Patient: Randy Collins           Date of Birth: October 10, 1961           MRN: [id number] Visit Date: 09/05/2022              Requested by: Julieanne Manson, MD [address] [place],  [place] [address] PCP: Julieanne Manson, MD   Assessment & Plan: Visit Diagnoses:  1. Protrusion of lumbar intervertebral disc   2. Spinal stenosis of lumbar region with neurogenic claudication     Plan: Return in 3 months.  We discussed that his stenosis likely will progress with time.  Smoking cessation is encouraged and he understands he have to have a negative nicotine test in order for his insurance that he currently has not approved the surgery.  Recheck 3 months.  Follow-Up Instructions: Return in about 3 months (around 12/05/2022).   Orders:  No orders of the defined types were placed in this encounter.  No orders of the defined types were placed in this encounter.     Procedures: No procedures performed   Clinical Data: No additional findings.   Subjective: Chief Complaint  Patient presents with   Lower Back - Pain, Follow-up    HPI 61 year old male returns with anterolisthesis L4-5 stenosis and lateral recess stenosis.  Epidural injection gave him about 50% relief.  He still has claudication and goes to the store has to stop on the way back in the store is 1 block away from his house.  He still is smoking and again we discussed smoking cessation his requirement for his insurance in order to have instrumented single level fusion for his degenerative anterolisthesis and stenosis at L4-5.  Additionally patient has some hypertension bipolar disorder.  Review of Systems no fever chills no bowel bladder symptoms all other 14 point system noncontributory to HPI.   Objective: Vital Signs: BP (!) 165/95   Pulse (!) 56   Ht 5\' 9"  (1.753 m)   Wt 160 lb (72.6 kg)   BMI 23.63 kg/m   Physical Exam Constitutional:      Appearance: He is well-developed.  HENT:      Head: Normocephalic and atraumatic.     Right Ear: External ear normal.     Left Ear: External ear normal.  Eyes:     Pupils: Pupils are equal, round, and reactive to light.  Neck:     Thyroid: No thyromegaly.     Trachea: No tracheal deviation.  Cardiovascular:     Rate and Rhythm: Normal rate.  Pulmonary:     Effort: Pulmonary effort is normal.     Breath sounds: No wheezing.  Abdominal:     General: Bowel sounds are normal.     Palpations: Abdomen is soft.  Musculoskeletal:     Cervical back: Neck supple.  Skin:    General: Skin is warm and dry.     Capillary Refill: Capillary refill takes less than 2 seconds.  Neurological:     Mental Status: He is alert and oriented to person, place, and time.  Psychiatric:        Behavior: Behavior normal.        Thought Content: Thought content normal.        Judgment: Judgment normal.     Ortho Exam normal heel-toe gait.  No isolated motor weakness lower extremities.  He still able to heel and toe walk.  Anterior tib is strong.  Specialty Comments:  No specialty comments available.  Imaging: No results found.   PMFS History: Patient Active Problem List   Diagnosis Date Noted   Spinal stenosis of lumbar region 09/05/2022   Protrusion of lumbar intervertebral disc 03/19/2022   Hypertensive urgency 10/20/2021   Tobacco abuse 10/20/2021   Right arm numbness 10/20/2021   Elevated troponin 10/20/2021   Other bipolar disorder (HCC) 10/20/2021   Psoriasis    Right lumbar radiculopathy 12/24/2017   Hypertension 12/24/2012   Arthritis 12/25/2007   Past Medical History:  Diagnosis Date   Arthritis 2009   likely psoriatic arthritis--inflammatory, but not symmetric, left hip and back, right elbow, both knees   Hypertension 2014   Psoriasis 2009   Right lumbar radiculopathy 2019    Family History  Problem Relation Age of Onset   Hypertension Mother    Arthritis Mother    Gout Mother    Hyperlipidemia Mother    Gout  Father    Alcohol abuse Father        probable cirrhosis   Heart disease Brother 26       AMI   HIV/AIDS Brother    Cancer Brother        not sure what primary    No past surgical history on file. Social History   Occupational History   Not on file  Tobacco Use   Smoking status: Every Day    Packs/day: 1.00    Types: Cigarettes   Smokeless tobacco: Never   Tobacco comments:    [facility]  Vaping Use   Vaping Use: Never used  Substance and Sexual Activity   Alcohol use: Yes    Comment: History of abuse.  Occasional beer now.   Drug use: Not Currently    Comment: history of speed, MJ, Acid.  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

## 2022-10-31 IMAGING — CR DG CHEST 2V
2 series · 2 of 2 positions shown · non-contrast
Comparison: 10/12/2021

CLINICAL DATA: Shortness of breath and dizziness.  Hypertension.

EXAM:
CHEST - 2 VIEW

[w chest pa]
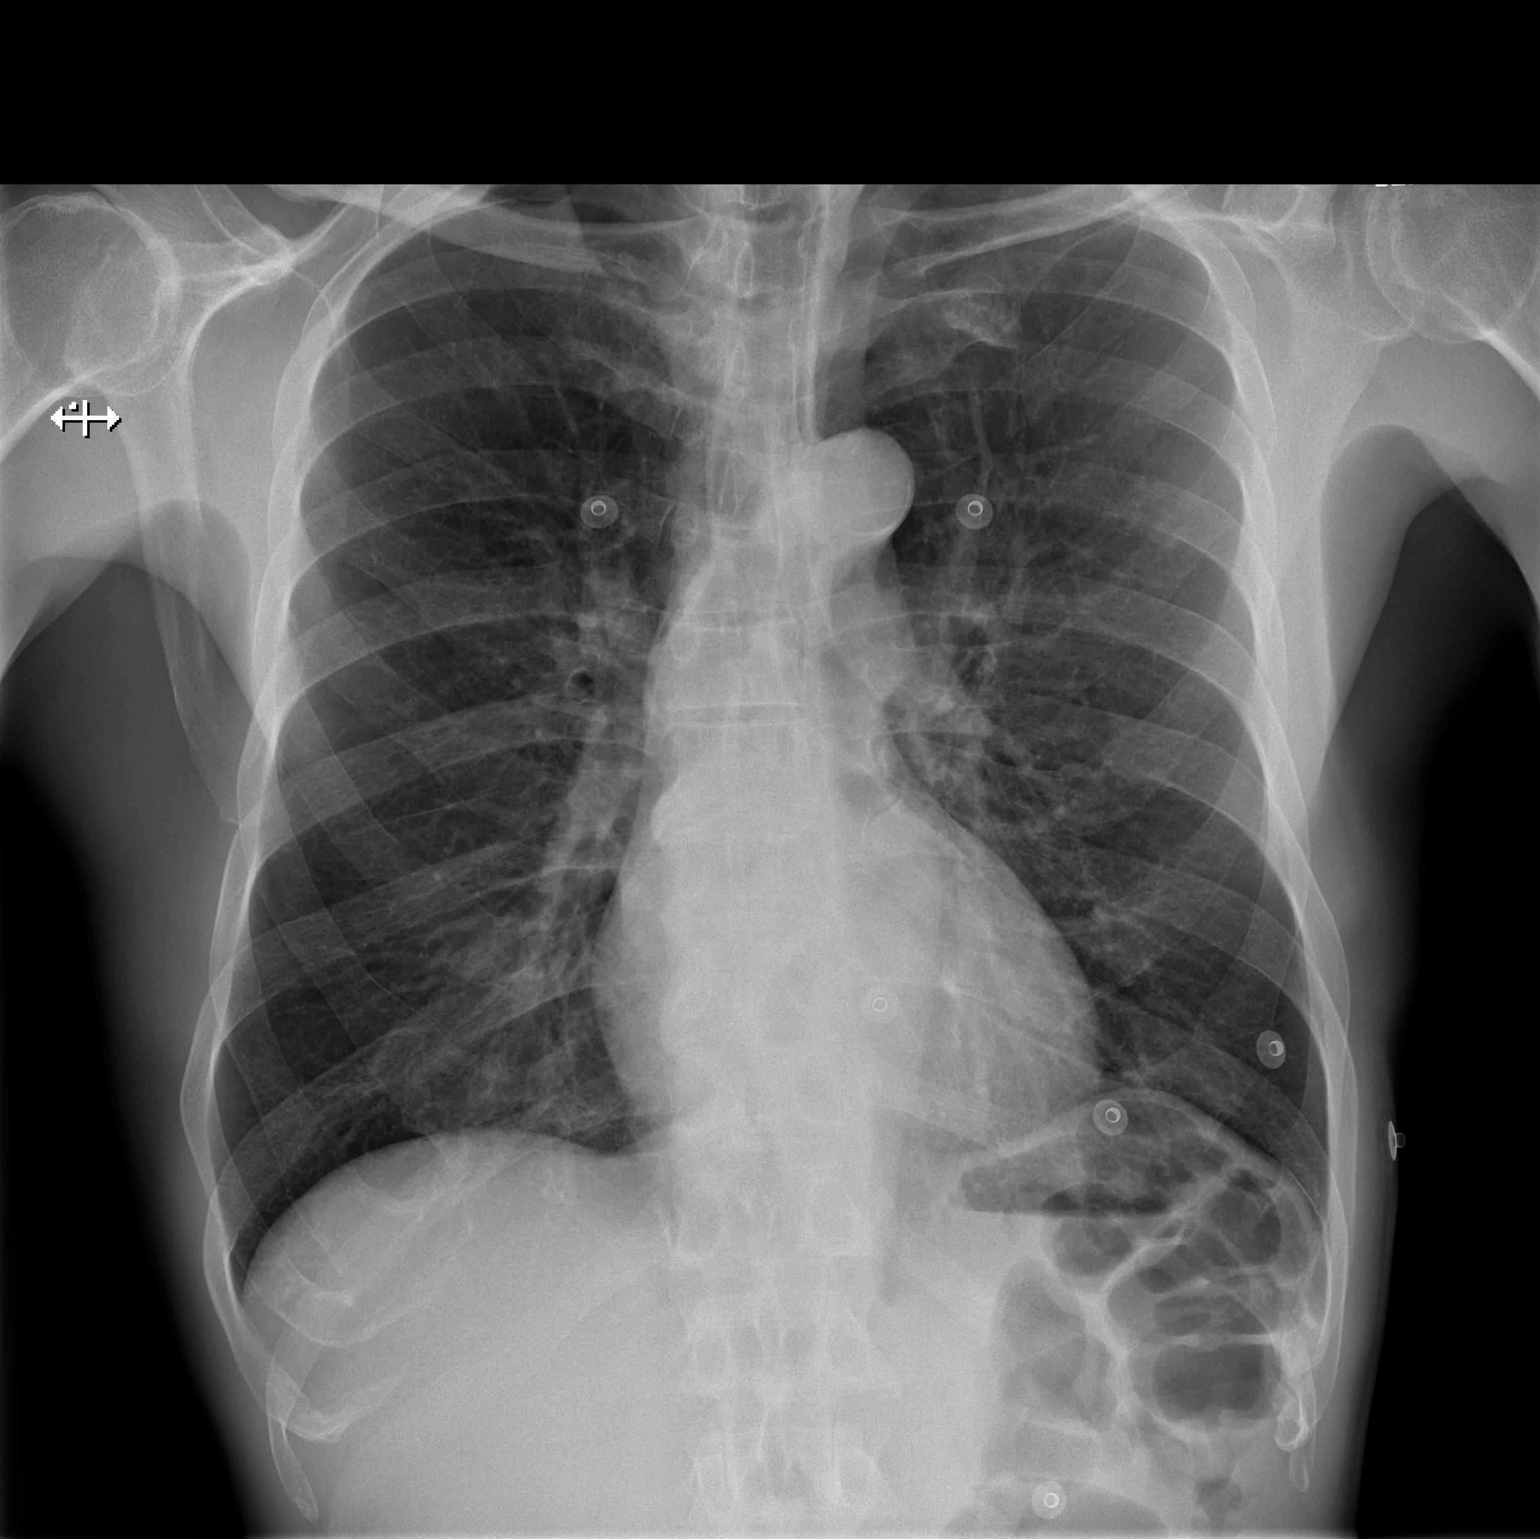

[w chest lat]
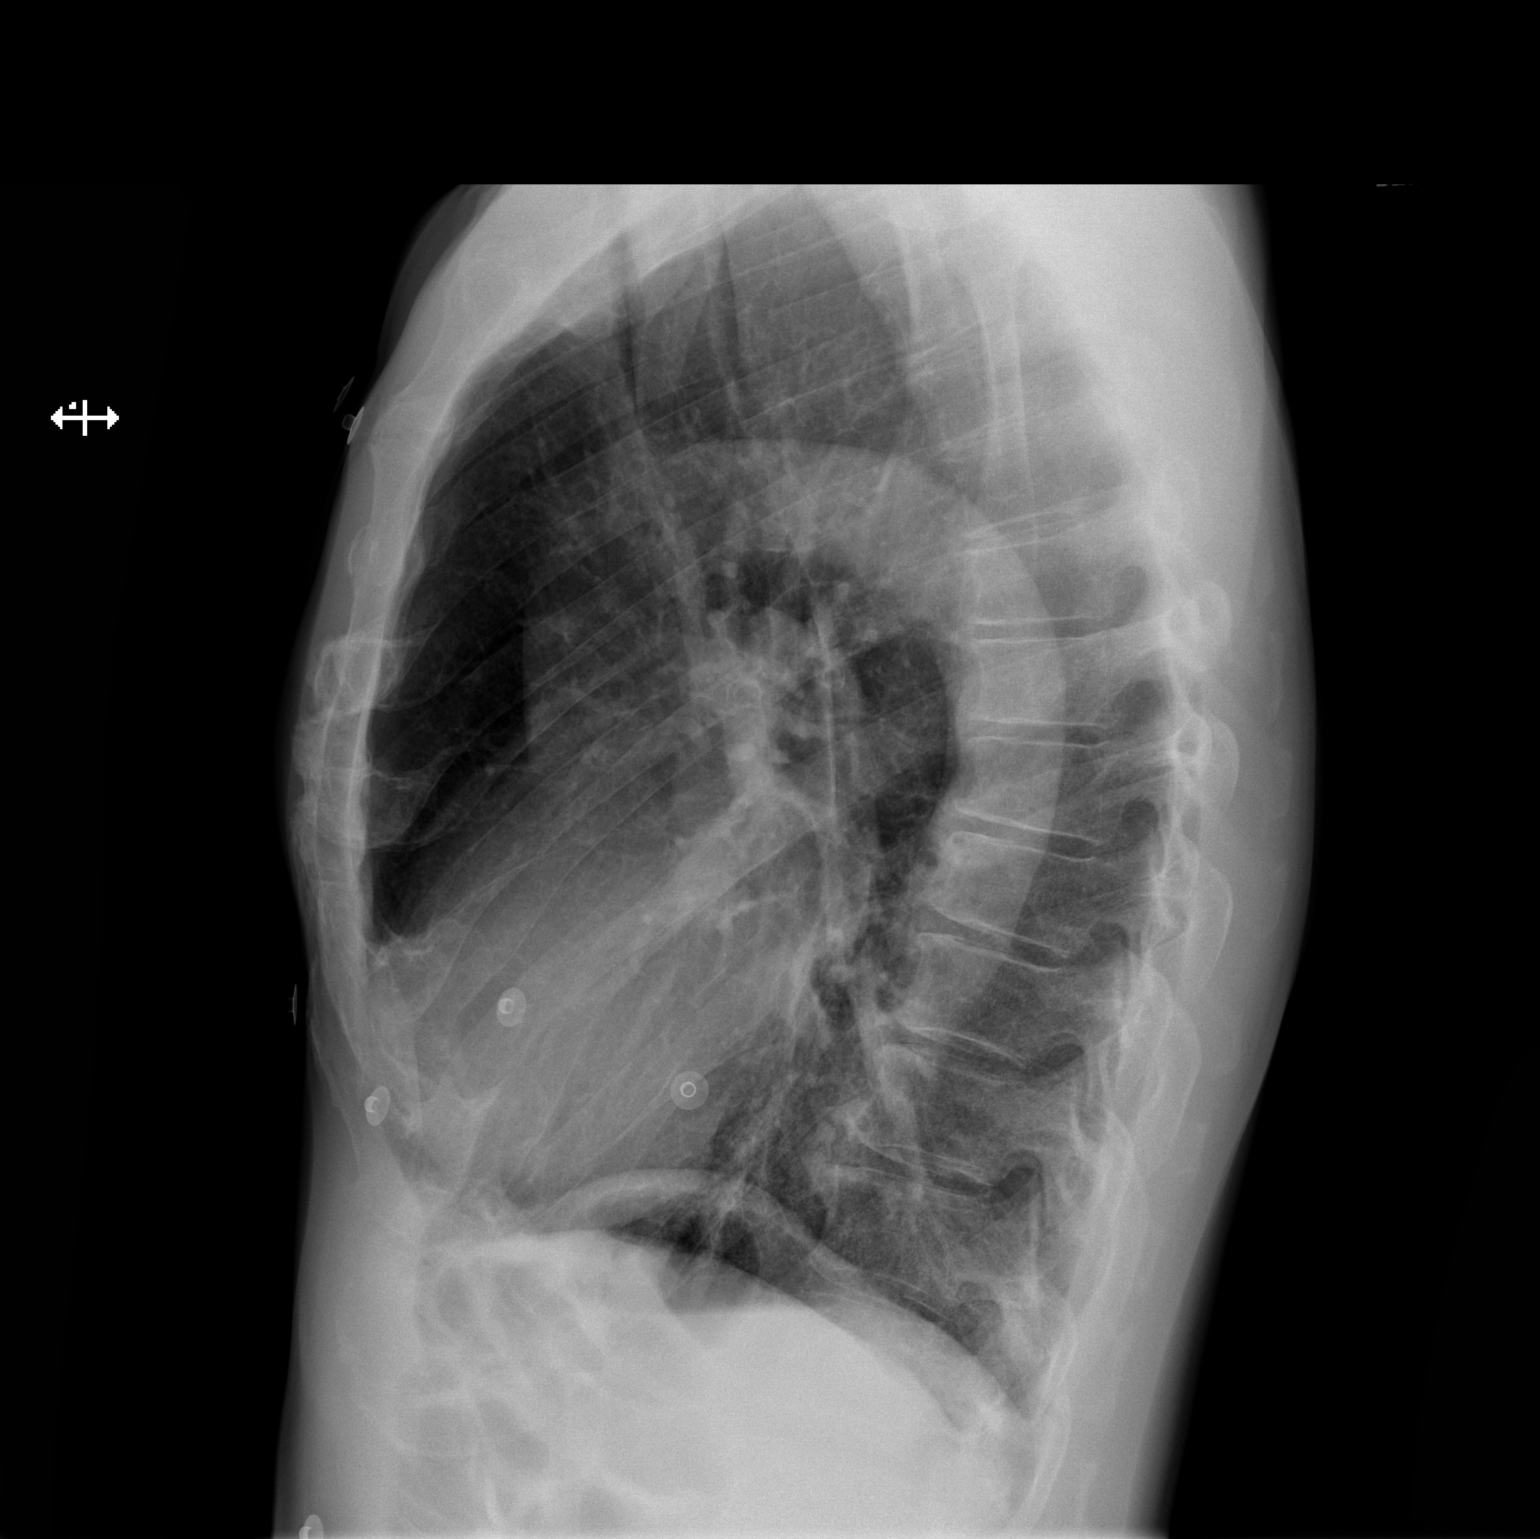

[2 of 2 positions shown; findings below may reference images not displayed]

FINDINGS: Lungs are hyperexpanded. The lungs are clear without focal
pneumonia, edema, pneumothorax or pleural effusion. The
cardiopericardial silhouette is within normal limits for size. The
visualized bony structures of the thorax show no acute abnormality.
IMPRESSION: No active cardiopulmonary disease.

## 2022-12-26 ENCOUNTER — Telehealth: Payer: Self-pay | Admitting: Physical Medicine and Rehabilitation

## 2022-12-26 DIAGNOSIS — M48062 Spinal stenosis, lumbar region with neurogenic claudication: Secondary | ICD-10-CM

## 2022-12-26 NOTE — Telephone Encounter (Signed)
Patient is requesting spinal injections

## 2022-12-27 NOTE — Telephone Encounter (Signed)
Referral entered  

## 2022-12-27 NOTE — Addendum Note (Signed)
Addended by: Meyer Cory on: 12/27/2022 03:40 PM   Modules accepted: Orders

## 2023-01-08 ENCOUNTER — Ambulatory Visit (INDEPENDENT_AMBULATORY_CARE_PROVIDER_SITE_OTHER): Payer: Medicaid Other | Admitting: Physical Medicine and Rehabilitation

## 2023-01-08 ENCOUNTER — Ambulatory Visit: Payer: Self-pay

## 2023-01-08 VITALS — BP 132/80 | HR 78

## 2023-01-08 DIAGNOSIS — M5416 Radiculopathy, lumbar region: Secondary | ICD-10-CM | POA: Diagnosis not present

## 2023-01-08 MED ORDER — METHYLPREDNISOLONE ACETATE 80 MG/ML IJ SUSP
80.0000 mg | Freq: Once | INTRAMUSCULAR | Status: AC
  Administered 2023-01-08: 80 mg

## 2023-01-08 NOTE — Progress Notes (Signed)
Functional Pain Scale - descriptive words and definitions  Distressing (6)    Pain is present/unable to complete most ADLs limited by pain/sleep is difficult and active distraction is only marginal. Moderate range order  Average Pain 6   +Driver, -BT, -Dye Allergies.

## 2023-01-21 NOTE — Progress Notes (Signed)
Randy Collins - 62 y.o. male MRN [id number]  Date of birth: 08-Apr-1961  Office Visit Note: Visit Date: 01/08/2023 PCP: Mack Hook, MD Referred by: Mack Hook, MD  Subjective: Chief Complaint  Patient presents with   Lower Back - Pain   HPI:  Randy Collins is a 62 y.o. male who comes in today at the request of Dr. Rodell Perna for planned Right L4-5 Lumbar Interlaminar epidural steroid injection with fluoroscopic guidance.  The patient has failed conservative care including home exercise, medications, time and activity modification.  This injection will be diagnostic and hopefully therapeutic.  Please see requesting physician notes for further details and justification.   ROS Otherwise per HPI.  Assessment & Plan: Visit Diagnoses:    ICD-10-CM   1. Lumbar radiculopathy  M54.16 XR C-ARM NO REPORT    Epidural Steroid injection    methylPREDNISolone acetate (DEPO-MEDROL) injection 80 mg      Plan: No additional findings.   Meds & Orders:  Meds ordered this encounter  Medications   methylPREDNISolone acetate (DEPO-MEDROL) injection 80 mg    Orders Placed This Encounter  Procedures   XR C-ARM NO REPORT   Epidural Steroid injection    Follow-up: Return for visit to requesting provider as needed.   Procedures: No procedures performed  Lumbar Epidural Steroid Injection - Interlaminar Approach with Fluoroscopic Guidance  Patient: Randy Collins      Date of Birth: 21-Mar-1961 MRN: [id number] PCP: Mack Hook, MD      Visit Date: 01/08/2023   Universal Protocol:     Consent Given By: the patient  Position: PRONE  Additional Comments: Vital signs were monitored before and after the procedure. Patient was prepped and draped in the usual sterile fashion. The correct patient, procedure, and site was verified.   Injection Procedure Details:   Procedure diagnoses: Lumbar radiculopathy [M54.16]   Meds Administered:  Meds ordered this  encounter  Medications   methylPREDNISolone acetate (DEPO-MEDROL) injection 80 mg     Laterality: Right  Location/Site:  L4-5  Needle: 3.5 in., 20 ga. Tuohy  Needle Placement: Paramedian epidural  Findings:   -Comments: Excellent flow of contrast into the epidural space.  Procedure Details: Using a paramedian approach from the side mentioned above, the region overlying the inferior lamina was localized under fluoroscopic visualization and the soft tissues overlying this structure were infiltrated with 4 ml. of 1% Lidocaine without Epinephrine. The Tuohy needle was inserted into the epidural space using a paramedian approach.   The epidural space was localized using loss of resistance along with counter oblique bi-planar fluoroscopic views.  After negative aspirate for air, blood, and CSF, a 2 ml. volume of Isovue-250 was injected into the epidural space and the flow of contrast was observed. Radiographs were obtained for documentation purposes.    The injectate was administered into the level noted above.   Additional Comments:  No complications occurred Dressing: 2 x 2 sterile gauze and Band-Aid    Post-procedure details: Patient was observed during the procedure. Post-procedure instructions were reviewed.  Patient left the clinic in stable condition.   Clinical History: MRI LUMBAR SPINE WITHOUT CONTRAST   TECHNIQUE: Multiplanar, multisequence MR imaging of the lumbar spine was performed. No intravenous contrast was administered.   COMPARISON:  05/25/2021 radiographs   FINDINGS: Segmentation: The lowest lumbar type non-rib-bearing vertebra is labeled as L5.   Alignment:  5 mm degenerative anterolisthesis at L4-5.   Vertebrae: Disc desiccation at L4-5. Small bilateral facet joint  effusions at the L4-5 level.   Conus medullaris and cauda equina: Conus extends to the L1 level. Conus and cauda equina appear normal.   Paraspinal and other soft tissues:  Unremarkable   Disc levels:   L1-2: Unremarkable.   L2-3: No impingement.  Mild left degenerative facet arthropathy.   L3-4: Mild left and borderline right foraminal stenosis along with mild left subarticular lateral recess stenosis due to degenerative facet arthropathy and mild disc bulge.   L4-5: Moderate to prominent bilateral foraminal stenosis, moderate to prominent left and moderate right subarticular lateral recess stenosis, and moderate central narrowing of the thecal sac due to disc uncovering, disc bulge, left paracentral disc protrusion, and degenerative facet arthropathy.   L5-S1: Moderate left and mild right foraminal stenosis with borderline left subarticular lateral recess stenosis due to disc bulge and facet arthropathy.   IMPRESSION: 1. Lumbar spondylosis and degenerative disc disease, causing moderate to prominent impingement at L4-5; moderate impingement at L5-S1; and mild impingement at L3-4, as detailed above. 2. Small bilateral facet joint effusions at L4-5. 3. 5 mm degenerative anterolisthesis at L4-5.     Electronically Signed   By: Van Clines M.D.   On: 03/09/2022 16:40     Objective:  VS:  HT:    WT:   BMI:     BP:132/80  HR:78bpm  TEMP: ( )  RESP:  Physical Exam Vitals and nursing note reviewed.  Constitutional:      General: He is not in acute distress.    Appearance: Normal appearance. He is not ill-appearing.  HENT:     Head: Normocephalic and atraumatic.     Right Ear: External ear normal.     Left Ear: External ear normal.     Nose: No congestion.  Eyes:     Extraocular Movements: Extraocular movements intact.  Cardiovascular:     Rate and Rhythm: Normal rate.     Pulses: Normal pulses.  Pulmonary:     Effort: Pulmonary effort is normal. No respiratory distress.  Abdominal:     General: There is no distension.     Palpations: Abdomen is soft.  Musculoskeletal:        General: No tenderness or signs of injury.      Cervical back: Neck supple.     Right lower leg: No edema.     Left lower leg: No edema.     Comments: Patient has good distal strength without clonus.  Skin:    Findings: No erythema or rash.  Neurological:     General: No focal deficit present.     Mental Status: He is alert and oriented to person, place, and time.     Sensory: No sensory deficit.     Motor: No weakness or abnormal muscle tone.     Coordination: Coordination normal.  Psychiatric:        Mood and Affect: Mood normal.        Behavior: Behavior normal.      Imaging: No results found.

## 2023-01-21 NOTE — Procedures (Signed)
Lumbar Epidural Steroid Injection - Interlaminar Approach with Fluoroscopic Guidance  Patient: Randy Collins      Date of Birth: 01/14/1961 MRN: [id number] PCP: Mack Hook, MD      Visit Date: 01/08/2023   Universal Protocol:     Consent Given By: the patient  Position: PRONE  Additional Comments: Vital signs were monitored before and after the procedure. Patient was prepped and draped in the usual sterile fashion. The correct patient, procedure, and site was verified.   Injection Procedure Details:   Procedure diagnoses: Lumbar radiculopathy [M54.16]   Meds Administered:  Meds ordered this encounter  Medications   methylPREDNISolone acetate (DEPO-MEDROL) injection 80 mg     Laterality: Right  Location/Site:  L4-5  Needle: 3.5 in., 20 ga. Tuohy  Needle Placement: Paramedian epidural  Findings:   -Comments: Excellent flow of contrast into the epidural space.  Procedure Details: Using a paramedian approach from the side mentioned above, the region overlying the inferior lamina was localized under fluoroscopic visualization and the soft tissues overlying this structure were infiltrated with 4 ml. of 1% Lidocaine without Epinephrine. The Tuohy needle was inserted into the epidural space using a paramedian approach.   The epidural space was localized using loss of resistance along with counter oblique bi-planar fluoroscopic views.  After negative aspirate for air, blood, and CSF, a 2 ml. volume of Isovue-250 was injected into the epidural space and the flow of contrast was observed. Radiographs were obtained for documentation purposes.    The injectate was administered into the level noted above.   Additional Comments:  No complications occurred Dressing: 2 x 2 sterile gauze and Band-Aid    Post-procedure details: Patient was observed during the procedure. Post-procedure instructions were reviewed.  Patient left the clinic in stable condition.

## 2023-01-22 ENCOUNTER — Ambulatory Visit (INDEPENDENT_AMBULATORY_CARE_PROVIDER_SITE_OTHER): Payer: Medicaid Other

## 2023-01-22 ENCOUNTER — Ambulatory Visit (INDEPENDENT_AMBULATORY_CARE_PROVIDER_SITE_OTHER): Payer: Medicaid Other | Admitting: Podiatry

## 2023-01-22 DIAGNOSIS — M79672 Pain in left foot: Secondary | ICD-10-CM

## 2023-01-22 DIAGNOSIS — M21619 Bunion of unspecified foot: Secondary | ICD-10-CM

## 2023-01-22 DIAGNOSIS — M216X2 Other acquired deformities of left foot: Secondary | ICD-10-CM

## 2023-01-22 MED ORDER — DICLOFENAC SODIUM 1 % EX GEL
2.0000 g | Freq: Four times a day (QID) | CUTANEOUS | 2 refills | Status: DC
Start: 2023-01-22 — End: 2023-10-17

## 2023-01-22 NOTE — Patient Instructions (Signed)
Hammer Toe Hammer toe is a change in the shape, or a deformity, of the toe. The deformity causes the middle joint of the toe to stay bent. Hammer toe starts gradually. At first, the toe can be straightened. Then over time, the toe deformity becomes stiff, inflexible, and permanently bent. Hammer toe usually affects the second, third, or fourth toe. A hammer toe causes pain, especially when wearing shoes. Corns and calluses can result from the toe rubbing against the inside of the shoe. Early treatments to keep the toe straight may relieve pain. As the deformity of the toe becomes stiff and permanent, surgery may be needed to straighten the toe. What are the causes? This condition is caused by abnormal bending of the toe joint that is closest to your foot. Over time, the toe bending downward pulls on the muscles and connections (tendons) of the toe joint, making them weak and stiff. Wearing shoes that are too narrow in the toe box and do not allow toes to fully straighten can cause this condition. What increases the risk? You are more likely to develop this condition if you: Are an older male. Wear shoes that are too small, or wear high-heeled shoes that pinch your toes. Have a second toe that is longer than your big toe (first toe). Injure your foot or toe. Have arthritis, or have a nerve or muscle disorder. Have diabetes or a condition known as Charcot joint, which may cause you to walk abnormally. Have a family history of hammer toe. Are a ballet dancer. What are the signs or symptoms? Pain and deformity of the toe are the main symptoms of this condition. The pain is worse when wearing shoes, walking, or running. Other symptoms may include: A thickened patch of skin, called a corn or callus, that forms over the top of the bent part of the toe or between the toes. Redness and a burning feeling on the bent toe. An open sore that forms on the top of the bent toe. Not being able to straighten  the affected toe. How is this diagnosed? This condition is diagnosed based on your symptoms and a physical exam. During the exam, your health care provider will try to straighten your toe to see how stiff the deformity is. You may also have tests, such as: A blood test to check for rheumatoid arthritis or diabetes. An X-ray to show how severe the toe deformity is. How is this treated? Treatment for this condition depends on whether the toe is flexible or deformed and no longer moveable. In less severe cases, a hammer toe can be straightened without surgery. These treatments include: Taping the toe into a straightened position. Using pads and cushions to protect the bent toe. Wearing shoes that provide enough room for the toes. Doing toe-stretching exercises at home. Taking an NSAID, such as ibuprofen, to reduce pain and swelling. Using special orthotics or insoles for pain relief and to improve walking. If these treatments do not help or the toe has a severe deformity and cannot be straightened, surgery is the next option. The most common surgeries used to straighten a hammer toe include: Arthroplasty or osteotomy. Part of the toe joint is reconstructed or removed, which allows the toe to straighten. Fusion. Cartilage between the two bones of the joint is taken out, and the bones are fused together into one longer bone. Implantation. Part of the bone is removed and replaced with an implant to allow the toe to move again. Flexor tendon transfer.   The tendons that curl the toes down (flexor tendons) are repositioned. Follow these instructions at home: Take over-the-counter and prescription medicines only as told by your health care provider. Do toe-straightening and stretching exercises as told by your health care provider. Keep all follow-up visits. This is important. How is this prevented? Wear shoes that fit properly and give your toes enough room. Shoes should not cause pain. Buy shoes at  the end of the day to make sure they fit well, since your foot may swell during the day. Make sure they are comfortable before you buy them. As you age, your shoe size might change, including the width. Measure both feet and buy shoes for the larger foot. A shoe repair store might be able to stretch shoes that feel tight in spots. Do not wear high-heeled shoes or shoes with pointed toes. Contact a health care provider if: Your pain gets worse. Your toe becomes red or swollen. You develop an open sore on your toe. Summary Hammer toe is a condition that gradually causes your toe to become bent and stiff. Hammer toe can be treated by taping the toe into a straightened position and doing toe-stretching exercises. If these treatments do not help, surgery may be needed. To prevent this condition, wear shoes that fit properly, give your toes enough room, and do not cause pain. This information is not intended to replace advice given to you by your health care provider. Make sure you discuss any questions you have with your health care provider. Document Revised: 03/17/2020 Document Reviewed: 03/17/2020 Elsevier Patient Education  2023 [employer] Bunion A bunion (hallux valgus) is a bump that forms slowly on the inner side of the big toe joint. It occurs when the big toe turns toward the second toe. Bunions may be small at first, but they often get larger over time. They can make walking painful. What are the causes? This condition may be caused by: Wearing narrow or pointed shoes that force the big toe to press against the other toes. Abnormal foot development that causes the foot to roll inward. Changes in the foot that are caused by certain diseases, such as rheumatoid arthritis or polio. A foot injury. What increases the risk? The following factors may make you more likely to develop this condition: Wearing shoes that squeeze the toes together. Having certain diseases, such as: Rheumatoid  arthritis. Polio. Cerebral palsy. Having family members who have bunions. Being born with abnormally shaped feet (a foot deformity), such as flat feet or low arches. Doing activities that put a lot of pressure on the feet, such as ballet dancing. What are the signs or symptoms?  The main symptom of this condition is a bump on your big toe that you can notice. Other symptoms may include: Pain. Redness and inflammation around your big toe. Thick or hardened skin on your big toe or between your toes. Stiffness or loss of motion in your big toe. Trouble with walking. How is this diagnosed? This condition may be diagnosed based on your symptoms, medical history, and activities. You may also have tests and imaging, such as: X-rays. These allow your health care provider to check the position of the bones in your foot and look for damage to your joint. They also help your health care provider determine the severity of your bunion and the best way to treat it. Joint aspiration. In this test, a sample of fluid is removed from the toe joint. This test may be done if   you are in a lot of pain. It helps rule out diseases that cause painful swelling of the joints, such as arthritis or gout. How is this treated? Treatment depends on the severity of your symptoms. The goal of treatment is to relieve symptoms and prevent your bunion from getting worse. Your health care provider may recommend: Wearing shoes that have a wide toe box, or using bunion pads to cushion the affected area. Taping your toes together to keep them in a normal position. Placing a device inside your shoe (orthotic device) to help reduce pressure on your toe joint. Taking medicine to ease pain and inflammation. Putting ice or heat on the affected area. Doing stretching exercises. Surgery, for severe cases. Follow these instructions at home: Managing pain, stiffness, and swelling     If directed, put ice on the painful area. To do  this: Put ice in a plastic bag. Place a towel between your skin and the bag. Leave the ice on for 20 minutes, 2-3 times a day. Remove the ice if your skin turns bright red. This is very important. If you cannot feel pain, heat, or cold, you have a greater risk of damage to the area. If directed, apply heat to the affected area before you exercise. Use the heat source that your health care provider recommends, such as a moist heat pack or a heating pad. Place a towel between your skin and the heat source. Leave the heat on for 20-30 minutes. Remove the heat if your skin turns bright red. This is especially important if you are unable to feel pain, heat, or cold. You have a greater risk of getting burned. General instructions Do exercises as told by your health care provider. Support your toe joint with proper footwear, shoe padding, or taping as told by your health care provider. Take over-the-counter and prescription medicines only as told by your health care provider. Do not use any products that contain nicotine or tobacco, such as cigarettes, e-cigarettes, and chewing tobacco. If you need help quitting, ask your health care provider. Keep all follow-up visits. This is important. Contact a health care provider if: Your symptoms get worse. Your symptoms do not improve in 2 weeks. Get help right away if: You have severe pain and trouble with walking. Summary A bunion is a bump on the inner side of the big toe joint that forms when the big toe turns toward the second toe. Bunions can make walking painful. Treatment depends on the severity of your symptoms. Support your toe joint with proper footwear, shoe padding, or taping as told by your health care provider. This information is not intended to replace advice given to you by your health care provider. Make sure you discuss any questions you have with your health care provider. Document Revised: 04/15/2020 Document Reviewed:  04/15/2020 Elsevier Patient Education  2023 Elsevier Inc.  

## 2023-01-22 NOTE — Progress Notes (Signed)
Subjective:   Patient ID: Randy Collins, male   DOB: 62 y.o.   MRN: [id number]   HPI Chief Complaint  Patient presents with   Foot Problem    left great toe bone sticking out    62 year old male presents the office with above concerns.  Started about 3 weeks ago.  Reports on the first MPJ.  No injuries that he reports.  No open sores.  Denies any fevers or chills.  He does not recall any history of gout.   Review of Systems  All other systems reviewed and are negative.  Past Medical History:  Diagnosis Date   Arthritis 2009   likely psoriatic arthritis--inflammatory, but not symmetric, left hip and back, right elbow, both knees   Hypertension 2014   Psoriasis 2009   Right lumbar radiculopathy 2019    No past surgical history on file.   Current Outpatient Medications:    amLODipine (NORVASC) 10 MG tablet, Take 1 tablet (10 mg total) by mouth daily., Disp: 30 tablet, Rfl: 11   aspirin 81 MG EC tablet, Take 1 tablet (81 mg total) by mouth daily. Swallow whole., Disp: 30 tablet, Rfl: 11   atorvastatin (LIPITOR) 40 MG tablet, Take 1 tablet (40 mg total) by mouth daily., Disp: 30 tablet, Rfl: 11   diclofenac Sodium (VOLTAREN) 1 % GEL, Apply 2 g topically 4 (four) times daily. Rub into affected area of foot 2 to 4 times daily, Disp: 100 g, Rfl: 2   oxyCODONE-acetaminophen (PERCOCET/ROXICET) 5-325 MG tablet, Take 1 tablet by mouth every 8 (eight) hours as needed for severe pain., Disp: 30 tablet, Rfl: 0   predniSONE (DELTASONE) 10 MG tablet, Take 1 tablet (10 mg total) by mouth daily with breakfast., Disp: 30 tablet, Rfl: 0   tiZANidine (ZANAFLEX) 4 MG tablet, Take 4 mg by mouth at bedtime as needed., Disp: , Rfl:   No Known Allergies        Objective:  Physical Exam  General: AAO x3, NAD  Dermatological: Callus on the the 2nd toe and hallux without any underlying ulceration drainage or any signs of infection.  No open lesions.  Vascular: Dorsalis Pedis artery and  Posterior Tibial artery pedal pulses are 2/4 bilateral with immedate capillary fill time. There is no pain with calf compression, swelling, warmth, erythema.   Neruologic: Grossly intact via light touch bilateral.   Musculoskeletal: There is a bunion noted on the left foot.  This corresponds to the area of where the bone is "sticking out".  There is trace edema there is no erythema or warmth.  No pain with MP joint motion.  No area pinpoint tenderness.  No open lesions.  Gait: Unassisted, Nonantalgic.       Assessment:   Bunion, Capsulitis left foot     Plan:  -Treatment options discussed including all alternatives, risks, and complications -Etiology of symptoms were discussed -X-rays were obtained and reviewed with the patient.  3 views of the foot were obtained.  No evidence of acute fracture.  Moderate bunions present. -Discussed Voltaren gel to apply topically.  Dispensed offloading pads.  Discussed shoe modifications to avoid excess pressure.  As a courtesy I debrided the calluses and complications of bleeding.  Moisturizer. -Currently no other symptoms of gout.  Will monitor.  Trula Slade DPM

## 2023-03-25 ENCOUNTER — Ambulatory Visit: Payer: Medicaid Other | Admitting: Podiatry

## 2023-04-09 ENCOUNTER — Telehealth: Payer: Self-pay | Admitting: Physical Medicine and Rehabilitation

## 2023-04-09 NOTE — Telephone Encounter (Signed)
Patient asking for an appointment for a back injection.

## 2023-04-11 NOTE — Telephone Encounter (Signed)
Left message with family member to return call to clinic

## 2023-04-16 ENCOUNTER — Telehealth: Payer: Self-pay | Admitting: Physical Medicine and Rehabilitation

## 2023-04-16 NOTE — Telephone Encounter (Signed)
Patient called returning a call to schedule with Dr. Alvester Morin. His call back number is [phone]

## 2023-04-16 NOTE — Telephone Encounter (Signed)
Tried calling pt no answer and no voice mail. Need to see if he is wanting to schedule repeat inj. If so need to see how much and how long last inj helped him

## 2023-04-19 NOTE — Telephone Encounter (Addendum)
Attempted to call patient to see if injection helped and for how long. No voicemail

## 2023-05-13 ENCOUNTER — Ambulatory Visit (HOSPITAL_COMMUNITY)
Admission: EM | Admit: 2023-05-13 | Discharge: 2023-05-13 | Disposition: A | Discharge status: Home / Self Care | Payer: Medicaid Other | Attending: Registered Nurse | Admitting: Registered Nurse

## 2023-05-13 ENCOUNTER — Encounter (HOSPITAL_COMMUNITY): Payer: Self-pay | Admitting: Registered Nurse

## 2023-05-13 DIAGNOSIS — Z5181 Encounter for therapeutic drug level monitoring: Secondary | ICD-10-CM | POA: Insufficient documentation

## 2023-05-13 DIAGNOSIS — Z789 Other specified health status: Secondary | ICD-10-CM | POA: Diagnosis present

## 2023-05-13 DIAGNOSIS — Z7189 Other specified counseling: Secondary | ICD-10-CM | POA: Insufficient documentation

## 2023-05-13 DIAGNOSIS — G4700 Insomnia, unspecified: Secondary | ICD-10-CM | POA: Diagnosis present

## 2023-05-13 DIAGNOSIS — Z79899 Other long term (current) drug therapy: Secondary | ICD-10-CM

## 2023-05-13 DIAGNOSIS — F4322 Adjustment disorder with anxiety: Secondary | ICD-10-CM | POA: Insufficient documentation

## 2023-05-13 MED ORDER — TRAZODONE HCL 50 MG PO TABS: 50.0000 mg | ORAL_TABLET | Freq: Every evening | ORAL | Status: DC | PRN

## 2023-05-13 MED ORDER — TRAZODONE HCL 50 MG PO TABS
50.0000 mg | ORAL_TABLET | Freq: Every evening | ORAL | Status: DC | PRN
  Filled 2023-05-13: qty 3

## 2023-05-13 MED ORDER — TRAZODONE HCL 50 MG PO TABS
50.0000 mg | ORAL_TABLET | Freq: Every day | ORAL | Status: DC
  Filled 2023-05-13: qty 3

## 2023-05-13 NOTE — Progress Notes (Signed)
   05/13/23 0850  [facility] Triage Screening (Walk-ins at [facility] only)  How Did You Hear About [place]? Self  What Is the Reason for Your Visit/Call Today? Randy Collins is a 62 year old male presenting to [facility] stating that he was put on meds (trazodone) for sleep and depression but he has not been able to get the medications and he has not had it in the past two months. Pt reports he is not sleeping well. Pt reports he was released from jail and he had an appointment at [facility] and missed the appointment and now he is trying to see what he can do to get the medications. Pt also has a job offer and in order to get the job he needs letter from [employer] saying that he is mentally able to work. Pt denies SI, HI, AVH. Pt denies drug use. 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 Reports heavy alcohol use a week ago. Drinks a 16 ounce beer daily but has not had it in a week.  How Long Has This Been Causing You Problems? 1-6 months  Have You Recently Had Any Thoughts About Hurting Yourself? No  Are You Planning to Commit Suicide/Harm Yourself At This time? No  Have you Recently Had Thoughts About Hurting Someone Randy Collins? No  Are You Planning To Harm Someone At This Time? No  Are you currently experiencing any auditory, visual or other hallucinations? No  Have You Used Any Alcohol or Drugs in the Past 24 Hours? No  Do you have any current medical co-morbidities that require immediate attention? No  Clinician description of patient physical appearance/behavior: calm  What Do You Feel Would Help You the Most Today? Treatment for Depression or other mood problem  If access to [facility] was not available, would you have sought care in the Emergency Department? No  Determination of Need Routine (7 days)  Options For Referral Medication Management;Outpatient Therapy

## 2023-05-13 NOTE — Progress Notes (Signed)
Randy Collins received his AVS, questions answered and he received his prescription and was discharge without incident.

## 2023-05-13 NOTE — Discharge Instructions (Addendum)
You have an appointment with Dr. Olen Pel on 06/06/2023 at 9:00 AM for medication management.  If you need to be seen prior to appointment please come in during walk in hours listed below.   [facility]: Outpatient psychiatric Services:   Please see the walk in hours listed below.  Medication Management New Patient needing Medication Management Walk-in, and Existing Patients needing to see a provider for management coming as a walk in   Monday thru Friday 8:00 AM first come first serve until slots are full.  Recommend being there by 7:15 AM to ensure a slot is open.  Therapy New Patient Therapy Intake and Existing Patients needing to see therapist coming in as a walk in.   Monday, Wednesday, and Thursday morning at 8:00 am first come first serve.  Recommend being there by 7:15 AM to ensure a slot is open.    Every 1st, 2nd, and 3rd Friday at 1:00 PM first come first serve until slots are full.  Will still need to come in that morning at 7:15 AM to get registered for an afternoon slot.  For all walk-ins we ask that you arrive by 7:15 am because patients will be seen in there order of arrival (FIRST COME FIRST SERVE) Availability is limited, therefore you may not be seen on the same day that you walk in if all slots are full.    Our goal is to serve and meet the needs of our community to the best of our ability.
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
## 2023-05-15 ENCOUNTER — Ambulatory Visit (INDEPENDENT_AMBULATORY_CARE_PROVIDER_SITE_OTHER): Payer: Medicaid Other | Admitting: Student in an Organized Health Care Education/Training Program

## 2023-05-15 VITALS — BP 177/94 | HR 56 | Resp 24 | Wt 152.0 lb

## 2023-05-15 DIAGNOSIS — R030 Elevated blood-pressure reading, without diagnosis of hypertension: Secondary | ICD-10-CM

## 2023-05-15 DIAGNOSIS — F319 Bipolar disorder, unspecified: Secondary | ICD-10-CM

## 2023-05-15 DIAGNOSIS — F4312 Post-traumatic stress disorder, chronic: Secondary | ICD-10-CM | POA: Diagnosis not present

## 2023-05-15 MED ORDER — CLONIDINE HCL 0.1 MG PO TABS: 0.1000 mg | ORAL_TABLET | Freq: Every day | ORAL | 1 refills | Status: DC

## 2023-05-15 MED ORDER — QUETIAPINE FUMARATE 50 MG PO TABS
50.0000 mg | ORAL_TABLET | Freq: Every day | ORAL | 1 refills | Status: DC
Start: 2023-05-15 — End: 2023-12-31
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
## 2023-05-15 NOTE — Addendum Note (Signed)
Addended by: Eliseo Gum B on: 05/15/2023 11:40 AM   Modules accepted: Orders
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
## 2023-06-06 ENCOUNTER — Ambulatory Visit (HOSPITAL_COMMUNITY): Payer: Medicaid Other | Admitting: Psychiatry

## 2023-06-18 ENCOUNTER — Ambulatory Visit (HOSPITAL_COMMUNITY): Payer: Medicaid Other | Admitting: Psychiatry

## 2023-07-03 ENCOUNTER — Ambulatory Visit (HOSPITAL_COMMUNITY)
Admission: EM | Admit: 2023-07-03 | Discharge: 2023-07-03 | Disposition: A | Discharge status: Home / Self Care | Payer: Self-pay | Attending: Emergency Medicine | Admitting: Emergency Medicine

## 2023-07-03 ENCOUNTER — Encounter (HOSPITAL_COMMUNITY): Payer: Self-pay | Admitting: Emergency Medicine

## 2023-07-03 ENCOUNTER — Ambulatory Visit (HOSPITAL_COMMUNITY): Payer: Medicaid Other | Admitting: Licensed Clinical Social Worker

## 2023-07-03 DIAGNOSIS — M7989 Other specified soft tissue disorders: Secondary | ICD-10-CM

## 2023-07-03 MED ORDER — KETOROLAC TROMETHAMINE 30 MG/ML IJ SOLN
30.0000 mg | Freq: Once | INTRAMUSCULAR | Status: AC
  Administered 2023-07-03: 30 mg via INTRAMUSCULAR

## 2023-07-03 MED ORDER — KETOROLAC TROMETHAMINE 30 MG/ML IJ SOLN
INTRAMUSCULAR | Status: AC
  Filled 2023-07-03: qty 1

## 2023-07-03 MED ORDER — NAPROXEN SODIUM 550 MG PO TABS: 550.0000 mg | ORAL_TABLET | Freq: Two times a day (BID) | ORAL | 0 refills | Status: DC

## 2023-07-03 MED ORDER — BACLOFEN 5 MG PO TABS: 5.0000 mg | ORAL_TABLET | Freq: Every evening | ORAL | 0 refills | Status: DC | PRN
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
## 2023-07-03 NOTE — ED Triage Notes (Signed)
Today having right ankle and foot swelling that is painful. Reports left is swollen but not as bad as right.

## 2023-07-03 NOTE — Discharge Instructions (Signed)
Today you were evaluated for swelling to feet  On exam I am unable to make indentions to your feet and you have no respiratory symptoms, low suspicion for your heart or your kidneys because, as you have swelling to both suspicion that you have a blood clot as this typically occurs to 1 side of the body at a time  I do believe today that your symptoms are muscular and therefore forward with treatment as such  You have been given an injection of Toradol today here in the office which helps to reduce swelling and helps with pain, daily you will start to see some relief about 30 minutes an hour  What is at home you may use naproxen twice daily as needed, may take Tylenol in addition to  You may use muscle relaxer at bedtime as needed, be mindful this will make you sleepy  Elevate feet whenever sitting and lying to help reduce swelling  You may apply compression socks or stockings which help with circulation and blood flow which in turn will help reduce swelling  For comfort you may apply heat over the affected areas in 10-minute intervals  For any further concerns you may follow-up with his urgent care or your primary doctor for reevaluation

## 2023-07-15 ENCOUNTER — Telehealth: Payer: Self-pay | Admitting: Physical Medicine and Rehabilitation

## 2023-07-15 NOTE — Telephone Encounter (Signed)
Pt came in today requesting a appt for spinal injection please advise

## 2023-07-18 NOTE — Telephone Encounter (Signed)
Left message with spouse to return call to clinic to get more information

## 2023-10-17 ENCOUNTER — Emergency Department (HOSPITAL_COMMUNITY): Payer: Medicaid Other

## 2023-10-17 ENCOUNTER — Emergency Department (HOSPITAL_COMMUNITY)
Admission: EM | Admit: 2023-10-17 | Discharge: 2023-10-17 | Disposition: A | Discharge status: Home / Self Care | Payer: Medicaid Other | Attending: Emergency Medicine | Admitting: Emergency Medicine

## 2023-10-17 ENCOUNTER — Other Ambulatory Visit: Payer: Self-pay

## 2023-10-17 ENCOUNTER — Encounter (HOSPITAL_COMMUNITY): Payer: Self-pay

## 2023-10-17 DIAGNOSIS — Z7982 Long term (current) use of aspirin: Secondary | ICD-10-CM | POA: Insufficient documentation

## 2023-10-17 DIAGNOSIS — R0789 Other chest pain: Secondary | ICD-10-CM

## 2023-10-17 DIAGNOSIS — R519 Headache, unspecified: Secondary | ICD-10-CM

## 2023-10-17 DIAGNOSIS — R944 Abnormal results of kidney function studies: Secondary | ICD-10-CM | POA: Insufficient documentation

## 2023-10-17 DIAGNOSIS — I160 Hypertensive urgency: Secondary | ICD-10-CM

## 2023-10-17 DIAGNOSIS — R7989 Other specified abnormal findings of blood chemistry: Secondary | ICD-10-CM | POA: Diagnosis not present

## 2023-10-17 DIAGNOSIS — Z79899 Other long term (current) drug therapy: Secondary | ICD-10-CM | POA: Diagnosis not present

## 2023-10-17 DIAGNOSIS — F319 Bipolar disorder, unspecified: Secondary | ICD-10-CM

## 2023-10-17 LAB — CBC
HCT: 43.6 % (ref 39.0–52.0)
Hemoglobin: 13.9 g/dL (ref 13.0–17.0)
MCH: 28.2 pg (ref 26.0–34.0)
MCHC: 31.9 g/dL (ref 30.0–36.0)
MCV: 88.4 fL (ref 80.0–100.0)
Platelets: 252 10*3/uL (ref 150–400)
RBC: 4.93 MIL/uL (ref 4.22–5.81)
RDW: 15.5 % (ref 11.5–15.5)
WBC: 8 10*3/uL (ref 4.0–10.5)
nRBC: 0 % (ref 0.0–0.2)

## 2023-10-17 LAB — TROPONIN I (HIGH SENSITIVITY)
Troponin I (High Sensitivity): 23 ng/L — ABNORMAL HIGH (ref <18)
Troponin I (High Sensitivity): 24 ng/L — ABNORMAL HIGH (ref <18)

## 2023-10-17 LAB — BASIC METABOLIC PANEL
Anion gap: 10 (ref 5–15)
BUN: 27 mg/dL — ABNORMAL HIGH (ref 8–23)
CO2: 20 mmol/L — ABNORMAL LOW (ref 22–32)
Calcium: 8.7 mg/dL — ABNORMAL LOW (ref 8.9–10.3)
Chloride: 106 mmol/L (ref 98–111)
Creatinine, Ser: 1.69 mg/dL — ABNORMAL HIGH (ref 0.61–1.24)
GFR, Estimated: 45 mL/min — ABNORMAL LOW (ref >60)
Glucose, Bld: 112 mg/dL — ABNORMAL HIGH (ref 70–99)
Potassium: 3.6 mmol/L (ref 3.5–5.1)
Sodium: 136 mmol/L (ref 135–145)

## 2023-10-17 MED ORDER — AMLODIPINE BESYLATE 5 MG PO TABS
10.0000 mg | ORAL_TABLET | Freq: Once | ORAL | Status: AC
  Administered 2023-10-17: 10 mg via ORAL
  Filled 2023-10-17: qty 2

## 2023-10-17 MED ORDER — CLONIDINE HCL 0.1 MG PO TABS
0.1000 mg | ORAL_TABLET | Freq: Once | ORAL | Status: AC
  Administered 2023-10-17: 0.1 mg via ORAL
  Filled 2023-10-17: qty 1

## 2023-10-17 MED ORDER — AMLODIPINE BESYLATE 10 MG PO TABS: 10.0000 mg | ORAL_TABLET | Freq: Every day | ORAL | 3 refills | Status: DC

## 2023-10-17 MED ORDER — CLONIDINE HCL 0.1 MG PO TABS: 0.1000 mg | ORAL_TABLET | Freq: Every day | ORAL | 2 refills | Status: DC

## 2023-10-17 NOTE — ED Provider Notes (Signed)
[place] [facility] AT [facility] Provider Note   CSN: [id number] Arrival date & time: 10/17/23  0406     History  Chief Complaint  Patient presents with   Chest Pain    Randy Collins is a 62 y.o. male.  HPI     This is a 62 year old male who presents from EMS with headache.  Patient reports that he woke up from sleep this morning with a headache.  When he got out of bed he developed chest discomfort with ambulation.  He states he mostly has pain in the chest that radiates into his right bicep.  He did receive aspirin and nitroglycerin by EMS.  He was noted to be hypertensive.  Currently he is without chest pain.  He is continuing to complain of sharp frontal headache.  Reports history of prior migraine headaches.  No nausea or vomiting.  Patient has not taken any medication in the last 3 months because "I did not think I needed to."  Home Medications Prior to Admission medications   Medication Sig Start Date End Date Taking? Authorizing Provider  amLODipine (NORVASC) 10 MG tablet Take 1 tablet (10 mg total) by mouth daily. 10/17/23   Samariya Rockhold, Mayer Masker, MD  aspirin 81 MG EC tablet Take 1 tablet (81 mg total) by mouth daily. Swallow whole. Patient not taking: Reported on 10/17/2023 11/29/21   Ihor Austin, NP  atorvastatin (LIPITOR) 40 MG tablet Take 1 tablet (40 mg total) by mouth daily. Patient not taking: Reported on 10/17/2023 11/29/21   Ihor Austin, NP  Baclofen 5 MG TABS Take 1 tablet (5 mg total) by mouth at bedtime as needed. Patient not taking: Reported on 10/17/2023 07/03/23   Valinda Hoar, NP  cloNIDine (CATAPRES) 0.1 MG tablet Take 1 tablet (0.1 mg total) by mouth at bedtime. 10/17/23   Donna Snooks, Mayer Masker, MD  naproxen sodium (ANAPROX DS) 550 MG tablet Take 1 tablet (550 mg total) by mouth 2 (two) times daily with a meal. Patient not taking: Reported on 10/17/2023 07/03/23   Valinda Hoar, NP  QUEtiapine (SEROQUEL) 50 MG tablet Take  1 tablet (50 mg total) by mouth at bedtime. Patient not taking: Reported on 10/17/2023 05/15/23 05/14/24  Bobbye Morton, MD  traZODone (DESYREL) 50 MG tablet Take 1 tablet (50 mg total) by mouth at bedtime as needed for sleep. Patient not taking: Reported on 10/17/2023 05/13/23   Rankin, Denice Bors B, NP      Allergies    Patient has no known allergies.    Review of Systems   Review of Systems  Constitutional:  Negative for fever.  Respiratory:  Negative for shortness of breath.   Cardiovascular:  Positive for chest pain. Negative for leg swelling.  Gastrointestinal:  Negative for abdominal pain, nausea and vomiting.  Neurological:  Positive for headaches. Negative for dizziness and weakness.  All other systems reviewed and are negative.   Physical Exam Updated Vital Signs BP (!) 142/87   Pulse 64   Temp 98.1 F (36.7 C) (Oral)   Resp 18   Ht 1.778 m (5\' 10" )   Wt 72.6 kg   SpO2 95%   BMI 22.96 kg/m  Physical Exam Vitals and nursing note reviewed.  Constitutional:      Appearance: He is well-developed. He is not ill-appearing.  HENT:     Head: Normocephalic and atraumatic.  Eyes:     Pupils: Pupils are equal, round, and reactive to light.  Cardiovascular:  Rate and Rhythm: Normal rate and regular rhythm.     Heart sounds: Normal heart sounds. No murmur heard. Pulmonary:     Effort: Pulmonary effort is normal. No respiratory distress.     Breath sounds: Normal breath sounds. No wheezing.  Abdominal:     General: Bowel sounds are normal.     Palpations: Abdomen is soft.     Tenderness: There is no abdominal tenderness. There is no rebound.  Musculoskeletal:     Cervical back: Neck supple.  Lymphadenopathy:     Cervical: No cervical adenopathy.  Skin:    General: Skin is warm and dry.  Neurological:     Mental Status: He is alert and oriented to person, place, and time.     Comments: Cranial nerves II through XII intact, 5 out of 5 strength in all 4 extremities, no  dysmetria to finger-nose-finger     ED Results / Procedures / Treatments   Labs (all labs ordered are listed, but only abnormal results are displayed) Labs Reviewed  BASIC METABOLIC PANEL - Abnormal; Notable for the following components:      Result Value   CO2 20 (*)    Glucose, Bld 112 (*)    BUN 27 (*)    Creatinine, Ser 1.69 (*)    Calcium 8.7 (*)    GFR, Estimated 45 (*)    All other components within normal limits  TROPONIN I (HIGH SENSITIVITY) - Abnormal; Notable for the following components:   Troponin I (High Sensitivity) 23 (*)    All other components within normal limits  TROPONIN I (HIGH SENSITIVITY) - Abnormal; Notable for the following components:   Troponin I (High Sensitivity) 24 (*)    All other components within normal limits  CBC    EKG EKG Interpretation Date/Time:  Thursday October 17 2023 04:07:37 EDT Ventricular Rate:  66 PR Interval:  112 QRS Duration:  82 QT Interval:  391 QTC Calculation: 410 R Axis:   -15  Text Interpretation: Sinus rhythm Borderline short PR interval LAE, consider biatrial enlargement Left ventricular hypertrophy Inferior infarct, old No acute changes Confirmed by Gilda Crease [phone]) on 10/17/2023 4:10:29 AM  Radiology DG Chest 2 View  Result Date: 10/17/2023 CLINICAL DATA:  Chest pain and headache. EXAM: CHEST - 2 VIEW COMPARISON:  01/23/2022 FINDINGS: The heart size and mediastinal contours are within normal limits. Aortic atherosclerotic calcifications. Both lungs are clear. The visualized skeletal structures are unremarkable. IMPRESSION: No active cardiopulmonary disease. Electronically Signed   By: Signa Kell M.D.   On: 10/17/2023 05:43   CT Head Wo Contrast  Result Date: 10/17/2023 CLINICAL DATA:  62 year old male with headache, chest pain. EXAM: CT HEAD WITHOUT CONTRAST TECHNIQUE: Contiguous axial images were obtained from the base of the skull through the vertex without intravenous contrast. RADIATION  DOSE REDUCTION: This exam was performed according to the departmental dose-optimization program which includes automated exposure control, adjustment of the mA and/or kV according to patient size and/or use of iterative reconstruction technique. COMPARISON:  Brain MRI and Head CT 10/20/2021. FINDINGS: Brain: Cerebral volume is stable and within normal limits for age. No midline shift, ventriculomegaly, mass effect, evidence of mass lesion, intracranial hemorrhage or evidence of cortically based acute infarction. Gray-white matter differentiation is within normal limits throughout the brain. Vascular: Calcified atherosclerosis at the skull base. No suspicious intracranial vascular hyperdensity. Skull: No acute osseous abnormality identified. Congenital incomplete ossification of the posterior C1 ring. Sinuses/Orbits: Visualized paranasal sinuses and mastoids are stable  and well aerated. Other: No acute orbit or scalp soft tissue finding. IMPRESSION: No acute intracranial abnormality. Stable and normal for age noncontrast CT appearance of the brain. Electronically Signed   By: Odessa Fleming M.D.   On: 10/17/2023 05:01    Procedures .Critical Care  Performed by: Shon Baton, MD Authorized by: Shon Baton, MD   Critical care provider statement:    Critical care time (minutes):  31   Critical care was necessary to treat or prevent imminent or life-threatening deterioration of the following conditions: Hypertensive urgency.   Critical care was time spent personally by me on the following activities:  Development of treatment plan with patient or surrogate, discussions with consultants, evaluation of patient's response to treatment, examination of patient, ordering and review of laboratory studies, ordering and review of radiographic studies, ordering and performing treatments and interventions, pulse oximetry, re-evaluation of patient's condition and review of old charts     Medications Ordered in  ED Medications  amLODipine (NORVASC) tablet 10 mg (10 mg Oral Given 10/17/23 0513)  cloNIDine (CATAPRES) tablet 0.1 mg (0.1 mg Oral Given 10/17/23 0514)    ED Course/ Medical Decision Making/ A&P Clinical Course as of 10/17/23 0719  Thu Oct 17, 2023  0718 On recheck, patient's blood pressure has much improved after his home blood pressure medication.  He states he feels much better.  He is pain-free.  No headache or chest pain.  Discussed with him that he needs to make sure that he is taking his blood pressure medications at home. [CH]    Clinical Course User Index [CH] Vance Belcourt, Mayer Masker, MD                                 Medical Decision Making Amount and/or Complexity of Data Reviewed Labs: ordered. Radiology: ordered.  Risk Prescription drug management.   This patient presents to the ED for concern of headache, chest pain, this involves an extensive number of treatment options, and is a complaint that carries with it a high risk of complications and morbidity.  I considered the following differential and admission for this acute, potentially life threatening condition.  The differential diagnosis includes hypertensive urgency, hypertensive emergency, ACS, intracranial bleed MDM:    This is a 62 year old male who presents with headache and chest pain.  He is significantly hypertensive.  Not taking his blood pressure medications.  He is nontoxic.  Neuroexam is reassuring.  Labs obtained and reviewed.  Creatinine slightly more elevated than prior but that was over a-year-old.  Troponin slightly elevated but better than prior.  Repeat troponin is flat.  Doubt primary ACS.  Suspect hypertensive urgency.  Chest x-ray without pneumothorax or pneumonia.  CT head negative.  Patient significantly improved.  Will restart his blood pressure medications.  (Labs, imaging, consults)  Labs: I Ordered, and personally interpreted labs.  The pertinent results include: CBC, BMP, troponin x  2  Imaging Studies ordered: I ordered imaging studies including chest x-ray, CT head I independently visualized and interpreted imaging. I agree with the radiologist interpretation  Additional history obtained from chart review.  External records from outside source obtained and reviewed including prior evaluations  Cardiac Monitoring: The patient was maintained on a cardiac monitor.  If on the cardiac monitor, I personally viewed and interpreted the cardiac monitored which showed an underlying rhythm of: Sinus rhythm  Reevaluation: After the interventions noted above, I reevaluated the  patient and found that they have :resolved  Social Determinants of Health:  lives independently  Disposition: Discharge  Co morbidities that complicate the patient evaluation  Past Medical History:  Diagnosis Date   Arthritis 2009   likely psoriatic arthritis--inflammatory, but not symmetric, left hip and back, right elbow, both knees   Hypertension 2014   Psoriasis 2009   Right lumbar radiculopathy 2019     Medicines Meds ordered this encounter  Medications   amLODipine (NORVASC) tablet 10 mg   cloNIDine (CATAPRES) tablet 0.1 mg   amLODipine (NORVASC) 10 MG tablet    Sig: Take 1 tablet (10 mg total) by mouth daily.    Dispense:  30 tablet    Refill:  3   cloNIDine (CATAPRES) 0.1 MG tablet    Sig: Take 1 tablet (0.1 mg total) by mouth at bedtime.    Dispense:  30 tablet    Refill:  2    I have reviewed the patients home medicines and have made adjustments as needed  Problem List / ED Course: Problem List Items Addressed This Visit       Cardiovascular and Mediastinum   Hypertensive urgency - Primary   Relevant Medications   amLODipine (NORVASC) 10 MG tablet   cloNIDine (CATAPRES) 0.1 MG tablet   Other Visit Diagnoses     Atypical chest pain       Acute nonintractable headache, unspecified headache type       Relevant Medications   amLODipine (NORVASC) tablet 10 mg  (Completed)   amLODipine (NORVASC) 10 MG tablet   Bipolar affective disorder, remission status unspecified (HCC)       Relevant Medications   cloNIDine (CATAPRES) 0.1 MG tablet                   Final Clinical Impression(s) / ED Diagnoses Final diagnoses:  Hypertensive urgency  Atypical chest pain  Acute nonintractable headache, unspecified headache type    Rx / DC Orders ED Discharge Orders          Ordered    amLODipine (NORVASC) 10 MG tablet  Daily        10/17/23 0709    cloNIDine (CATAPRES) 0.1 MG tablet  Daily at bedtime        10/17/23 0709              Shon Baton, MD 10/17/23 [phone]

## 2023-10-17 NOTE — Discharge Instructions (Signed)
You were seen today for headache and chest pain.  This is likely related to your uncontrolled hypertension.  Your blood pressure improved nicely with your home blood pressure medications.  It is very important that you restart these medications.  Follow-up with your primary doctor.  If you do not have a primary doctor, you may call [place] [facility] to establish care.  Please return for any new or worsening symptoms.

## 2023-10-17 NOTE — ED Triage Notes (Addendum)
Pt presents via EMS c/o right sided chest pain starting PTA. Reports went to bed last night with a headache and woke up this am with chest pain. EMS reports elevation in leads V2-V3 upon pulling up to ED. nitroglycerin SL x1, and ASA 324 given by EMS. Pt denies chest pain at this time, reports headache.

## 2023-12-31 ENCOUNTER — Inpatient Hospital Stay (HOSPITAL_COMMUNITY): Admission: AD | Admit: 2023-12-31 | Payer: Medicaid Other | Source: Intra-hospital | Admitting: Psychiatry

## 2023-12-31 ENCOUNTER — Ambulatory Visit (HOSPITAL_COMMUNITY)
Admission: EM | Admit: 2023-12-31 | Discharge: 2023-12-31 | Payer: Medicaid Other | Attending: Psychiatry | Admitting: Psychiatry

## 2023-12-31 ENCOUNTER — Ambulatory Visit: Payer: Medicaid Other | Admitting: Orthopaedic Surgery

## 2023-12-31 ENCOUNTER — Encounter (HOSPITAL_COMMUNITY): Payer: Self-pay | Admitting: Emergency Medicine

## 2023-12-31 ENCOUNTER — Observation Stay (HOSPITAL_COMMUNITY)
Admission: EM | Admit: 2023-12-31 | Discharge: 2024-01-02 | Payer: Medicaid Other | Attending: Internal Medicine | Admitting: Internal Medicine

## 2023-12-31 ENCOUNTER — Emergency Department (HOSPITAL_COMMUNITY): Payer: Medicaid Other

## 2023-12-31 ENCOUNTER — Other Ambulatory Visit: Payer: Self-pay

## 2023-12-31 DIAGNOSIS — I10 Essential (primary) hypertension: Secondary | ICD-10-CM | POA: Diagnosis present

## 2023-12-31 DIAGNOSIS — E785 Hyperlipidemia, unspecified: Secondary | ICD-10-CM | POA: Diagnosis not present

## 2023-12-31 DIAGNOSIS — T428X6A Underdosing of antiparkinsonism drugs and other central muscle-tone depressants, initial encounter: Secondary | ICD-10-CM | POA: Insufficient documentation

## 2023-12-31 DIAGNOSIS — Z91148 Patient's other noncompliance with medication regimen for other reason: Secondary | ICD-10-CM | POA: Insufficient documentation

## 2023-12-31 DIAGNOSIS — I160 Hypertensive urgency: Secondary | ICD-10-CM | POA: Insufficient documentation

## 2023-12-31 DIAGNOSIS — N183 Chronic kidney disease, stage 3 unspecified: Secondary | ICD-10-CM | POA: Diagnosis present

## 2023-12-31 DIAGNOSIS — R9431 Abnormal electrocardiogram [ECG] [EKG]: Secondary | ICD-10-CM | POA: Insufficient documentation

## 2023-12-31 DIAGNOSIS — T443X6A Underdosing of other parasympatholytics [anticholinergics and antimuscarinics] and spasmolytics, initial encounter: Secondary | ICD-10-CM | POA: Insufficient documentation

## 2023-12-31 DIAGNOSIS — F209 Schizophrenia, unspecified: Secondary | ICD-10-CM | POA: Diagnosis not present

## 2023-12-31 DIAGNOSIS — F17210 Nicotine dependence, cigarettes, uncomplicated: Secondary | ICD-10-CM | POA: Diagnosis not present

## 2023-12-31 DIAGNOSIS — T43216A Underdosing of selective serotonin and norepinephrine reuptake inhibitors, initial encounter: Secondary | ICD-10-CM | POA: Insufficient documentation

## 2023-12-31 DIAGNOSIS — R0789 Other chest pain: Secondary | ICD-10-CM | POA: Diagnosis present

## 2023-12-31 DIAGNOSIS — F32A Depression, unspecified: Secondary | ICD-10-CM | POA: Insufficient documentation

## 2023-12-31 DIAGNOSIS — F1910 Other psychoactive substance abuse, uncomplicated: Secondary | ICD-10-CM | POA: Diagnosis not present

## 2023-12-31 DIAGNOSIS — Z7982 Long term (current) use of aspirin: Secondary | ICD-10-CM | POA: Insufficient documentation

## 2023-12-31 DIAGNOSIS — Z5900 Homelessness unspecified: Secondary | ICD-10-CM | POA: Insufficient documentation

## 2023-12-31 DIAGNOSIS — Z79899 Other long term (current) drug therapy: Secondary | ICD-10-CM | POA: Insufficient documentation

## 2023-12-31 DIAGNOSIS — F323 Major depressive disorder, single episode, severe with psychotic features: Secondary | ICD-10-CM | POA: Insufficient documentation

## 2023-12-31 DIAGNOSIS — R7303 Prediabetes: Secondary | ICD-10-CM | POA: Diagnosis present

## 2023-12-31 DIAGNOSIS — F1410 Cocaine abuse, uncomplicated: Secondary | ICD-10-CM | POA: Insufficient documentation

## 2023-12-31 DIAGNOSIS — F419 Anxiety disorder, unspecified: Secondary | ICD-10-CM | POA: Diagnosis not present

## 2023-12-31 DIAGNOSIS — I129 Hypertensive chronic kidney disease with stage 1 through stage 4 chronic kidney disease, or unspecified chronic kidney disease: Secondary | ICD-10-CM | POA: Diagnosis not present

## 2023-12-31 DIAGNOSIS — T461X6A Underdosing of calcium-channel blockers, initial encounter: Secondary | ICD-10-CM | POA: Insufficient documentation

## 2023-12-31 DIAGNOSIS — E7800 Pure hypercholesterolemia, unspecified: Secondary | ICD-10-CM | POA: Diagnosis not present

## 2023-12-31 DIAGNOSIS — T43596A Underdosing of other antipsychotics and neuroleptics, initial encounter: Secondary | ICD-10-CM | POA: Insufficient documentation

## 2023-12-31 DIAGNOSIS — N1831 Chronic kidney disease, stage 3a: Secondary | ICD-10-CM | POA: Diagnosis not present

## 2023-12-31 DIAGNOSIS — Z9151 Personal history of suicidal behavior: Secondary | ICD-10-CM | POA: Insufficient documentation

## 2023-12-31 DIAGNOSIS — R079 Chest pain, unspecified: Principal | ICD-10-CM

## 2023-12-31 LAB — CBC WITH DIFFERENTIAL/PLATELET
Abs Immature Granulocytes: 0.03 10*3/uL (ref 0.00–0.07)
Basophils Absolute: 0 10*3/uL (ref 0.0–0.1)
Basophils Relative: 1 %
Eosinophils Absolute: 0 10*3/uL (ref 0.0–0.5)
Eosinophils Relative: 0 %
HCT: 47.4 % (ref 39.0–52.0)
Hemoglobin: 15.4 g/dL (ref 13.0–17.0)
Immature Granulocytes: 1 %
Lymphocytes Relative: 20 %
Lymphs Abs: 1.3 10*3/uL (ref 0.7–4.0)
MCH: 29.9 pg (ref 26.0–34.0)
MCHC: 32.5 g/dL (ref 30.0–36.0)
MCV: 92 fL (ref 80.0–100.0)
Monocytes Absolute: 0.5 10*3/uL (ref 0.1–1.0)
Monocytes Relative: 8 %
Neutro Abs: 4.5 10*3/uL (ref 1.7–7.7)
Neutrophils Relative %: 70 %
Platelets: 306 10*3/uL (ref 150–400)
RBC: 5.15 MIL/uL (ref 4.22–5.81)
RDW: 13.2 % (ref 11.5–15.5)
WBC: 6.4 10*3/uL (ref 4.0–10.5)
nRBC: 0 % (ref 0.0–0.2)

## 2023-12-31 LAB — VITAMIN B12: Vitamin B-12: 404 pg/mL (ref 180–914)

## 2023-12-31 LAB — COMPREHENSIVE METABOLIC PANEL
ALT: 12 U/L (ref 0–44)
AST: 16 U/L (ref 15–41)
Albumin: 3.5 g/dL (ref 3.5–5.0)
Alkaline Phosphatase: 57 U/L (ref 38–126)
Anion gap: 9 (ref 5–15)
BUN: 28 mg/dL — ABNORMAL HIGH (ref 8–23)
CO2: 27 mmol/L (ref 22–32)
Calcium: 9 mg/dL (ref 8.9–10.3)
Chloride: 105 mmol/L (ref 98–111)
Creatinine, Ser: 1.51 mg/dL — ABNORMAL HIGH (ref 0.61–1.24)
GFR, Estimated: 52 mL/min — ABNORMAL LOW (ref >60)
Glucose, Bld: 94 mg/dL (ref 70–99)
Potassium: 4.3 mmol/L (ref 3.5–5.1)
Sodium: 141 mmol/L (ref 135–145)
Total Bilirubin: 0.5 mg/dL (ref 0.0–1.2)
Total Protein: 6.8 g/dL (ref 6.5–8.1)

## 2023-12-31 LAB — POCT URINE DRUG SCREEN - MANUAL ENTRY (I-SCREEN)
POC Amphetamine UR: NOT DETECTED
POC Buprenorphine (BUP): NOT DETECTED
POC Cocaine UR: POSITIVE — AB
POC Marijuana UR: POSITIVE — AB
POC Methadone UR: NOT DETECTED
POC Methamphetamine UR: NOT DETECTED
POC Morphine: NOT DETECTED
POC Oxazepam (BZO): NOT DETECTED
POC Oxycodone UR: NOT DETECTED
POC Secobarbital (BAR): NOT DETECTED

## 2023-12-31 LAB — ETHANOL: Alcohol, Ethyl (B): <10 mg/dL (ref <10)

## 2023-12-31 LAB — TROPONIN I (HIGH SENSITIVITY)
Troponin I (High Sensitivity): 16 ng/L (ref <18)
Troponin I (High Sensitivity): 16 ng/L (ref <18)

## 2023-12-31 LAB — T4, FREE: Free T4: 0.92 ng/dL (ref 0.61–1.12)

## 2023-12-31 LAB — LIPID PANEL
Cholesterol: 174 mg/dL (ref 0–200)
HDL: 73 mg/dL (ref >40)
LDL Cholesterol: 89 mg/dL (ref 0–99)
Total CHOL/HDL Ratio: 2.4 {ratio}
Triglycerides: 60 mg/dL (ref <150)
VLDL: 12 mg/dL (ref 0–40)

## 2023-12-31 LAB — TSH: TSH: 0.767 u[IU]/mL (ref 0.350–4.500)

## 2023-12-31 LAB — MAGNESIUM: Magnesium: 2 mg/dL (ref 1.7–2.4)

## 2023-12-31 MED ORDER — AMLODIPINE BESYLATE 10 MG PO TABS: 10.0000 mg | ORAL_TABLET | Freq: Every day | ORAL | Status: DC

## 2023-12-31 MED ORDER — ACETAMINOPHEN 325 MG PO TABS: 650.0000 mg | ORAL_TABLET | Freq: Four times a day (QID) | ORAL | Status: DC | PRN

## 2023-12-31 MED ORDER — MAGNESIUM HYDROXIDE 400 MG/5ML PO SUSP: 30.0000 mL | Freq: Every day | ORAL | Status: DC | PRN

## 2023-12-31 MED ORDER — ALUM & MAG HYDROXIDE-SIMETH 200-200-20 MG/5ML PO SUSP: 30.0000 mL | ORAL | Status: DC | PRN

## 2023-12-31 MED ORDER — TRAZODONE HCL 50 MG PO TABS: 50.0000 mg | ORAL_TABLET | Freq: Every evening | ORAL | Status: DC | PRN

## 2023-12-31 MED ORDER — HYDROXYZINE HCL 25 MG PO TABS: 25.0000 mg | ORAL_TABLET | Freq: Three times a day (TID) | ORAL | Status: DC | PRN

## 2023-12-31 NOTE — ED Triage Notes (Signed)
 Pt BIB EMS from [facility] for R sided CP that started around 4pm described as a chest tightness. 1 Nitroglycerin and 324 ASA given by EMS. Reports "a little" ShOB. Denies NV. Was being seen at [facility] for SI, denies these thoughts at this time.

## 2023-12-31 NOTE — ED Notes (Signed)
 Patient had an abnormal EKG. Patient was asked if he was experiencing any chest pain, tightness/ pressure, arm pain/numbness, jaw or neck pain Patient denied. Patient did not present with sx. ( Diaphoresis, shortness of breath, hiccups) The patient was seen by the MD and informed the MD that he was experiencing chest pressure. 911 alerted of  patient's status, awaiting arrival.

## 2023-12-31 NOTE — Discharge Instructions (Signed)
 Transfer to [facility] emergency department for medical clearance, Dr. Rhunette Croft is the accepting MD

## 2023-12-31 NOTE — BH Assessment (Signed)
 Comprehensive Clinical Assessment (CCA) Note   12/31/2023 Randy Collins [id number]  Disposition: Elveria Batter, NP, recommends inpatient hospitalization.   The patient demonstrates the following risk factors for suicide: Chronic risk factors for suicide include: psychiatric disorder of Schizophrenia . Acute risk factors for suicide include: unemployment. Protective factors for this patient include: coping skills. Considering these factors, the overall suicide risk at this point appears to be low. Patient is not appropriate for outpatient follow up.   Patient presents to the [facility] with suicidal and homicidal ideation.  He states that he has no specific plan of how he would hurt himself today, but states that he has previous suicide attempts by ingesting rat poison and ingesting drano. Patient states that he is hearing voices telling him to kill people, but has no plan or identified victim. Patient states that heis diagnosed with schizophrenia and states that he has been receiving treatment from Dr. Nydia as well as Dr. Cam.  He states that he is prescribed Artane, Cogentin  and Trazodone , but states that he has not taken his medication in over a week.  Patientadmits to daily use of cocaine and states that he last used this morning.  He states that he also uses marijuana, but not daily.  Patient indicates that he has sleep and appetite disturbance and states that he has lost weight.  Patient is homeless, but receives disability.  He has minimal support in his life.  Due to his suicidal and homicidal ideation as well as his auditory hallucinations, he is urgent.  On evaluation, patient is alert, oriented x 3, and cooperative. Speech is clear, coherent and logical. Pt appears casual. Eye contact is fair. Mood is anxious and depressed, affect is congruent with mood. Thought process is logical and thought content is coherent.Pt currently denies SI and HI. Pt reports AVH. There is no indication that the  patient is responding to internal stimuli. No delusions elicited during this assessment.     Chief Complaint:  Chief Complaint  Patient presents with   Suicidal   Depression   Addiction Problem   Schizophrenia   Visit Diagnosis:  Schizophrenia     CCA Screening, Triage and Referral (STR)  Patient Reported Information How did you hear about us ? Self  What Is the Reason for Your Visit/Call Today? Patient presents to the [facility] with suicidal and homicidal ideation.  He states that he has no specific plan of how he would hurt himself today, but states that he has previous suicide attempts by ingesting rat poison and ingesting drano. Patient states that he is hearing voices telling him to kill people, but has no plan or identified victim. Patient states that heis diagnosed with schizophrenia and states that he has been receiving treatment from Dr. Nydia as well as Dr. Cam.  He states that he is prescribed Artane, Cogentin  and Trazodone , but states that he has not taken his medication in over a week.  Patientadmits to daily use of cocaine and states that he last used this morning.  He states that he also uses marijuana, but not daily.  Patient indicates that he has sleep and appetite disturbance and states that he has lost weight.  Patient is homeless, but receives disability.  He has minimal support in his life.  Due to his suicidal and homicidal ideation as well as his auditory hallucinations, he is urgent.  How Long Has This Been Causing You Problems? 1 wk - 1 month  What Do You Feel Would Help You the  Most Today? Alcohol or Drug Use Treatment; Treatment for Depression or other mood problem   Have You Recently Had Any Thoughts About Hurting Yourself? Yes  Are You Planning to Commit Suicide/Harm Yourself At This time? No (patient states that he wants help)   Flowsheet Row ED from 12/31/2023 in [facility] ED from 10/17/2023 in [facility] Emergency Department  at [facility] ED from 05/13/2023 in [facility]  C-SSRS RISK CATEGORY High Risk No Risk Low Risk       Have you Recently Had Thoughts About Hurting Someone Sherral? Yes  Are You Planning to Harm Someone at This Time? No  Explanation: Denies HI with plan nor intent at this this time   Have You Used Any Alcohol or Drugs in the Past 24 Hours? Yes  What Did You Use and How Much? cocaine and marijuana   Do You Currently Have a Therapist/Psychiatrist? Yes  Name of Therapist/Psychiatrist: Name of Therapist/Psychiatrist: Dr. Nydia as well as Dr. Cam   Have You Been Recently Discharged From Any Office Practice or Programs? No  Explanation of Discharge From Practice/Program: n/a     CCA Screening Triage Referral Assessment Type of Contact: Face-to-Face  Telemedicine Service Delivery:   Is this Initial or Reassessment?   Date Telepsych consult ordered in [facility]:    Time Telepsych consult ordered in [facility]:    Location of Assessment: [facility]  Provider Location: [facility] Assessment Services   Collateral Involvement: None   Does Patient Have a Automotive Engineer Guardian? No  Legal Guardian Contact Information: n/a  Copy of Legal Guardianship Form: -- (n/a)  Legal Guardian Notified of Arrival: -- (n/a)  Legal Guardian Notified of Pending Discharge: -- (n/a)  If Minor and Not Living with Parent(s), Who has Custody? n/a  Is CPS involved or ever been involved? Never  Is APS involved or ever been involved? Never   Patient Determined To Be At Risk for Harm To Self or Others Based on Review of Patient Reported Information or Presenting Complaint? No  Method: No Plan  Availability of Means: No access or NA  Intent: No Notification Required: No need or identified person  Additional Information for Danger to Others Potential: -- (n/a)  Additional Comments for Danger to Others Potential: n/a  Are There Guns or Other  Weapons in Your Home? No  Types of Guns/Weapons: Pt denies access to guns at this time  Are These Weapons Safely Secured?                            No  Who Could Verify You Are Able To Have These Secured: n/a  Do You Have any Outstanding Charges, Pending Court Dates, Parole/Probation? n/a  Contacted To Inform of Risk of Harm To Self or Others: -- (n/a)    Does Patient Present under Involuntary Commitment? No    [place] of Residence: [place]   Patient Currently Receiving the Following Services: Medication Management   Determination of Need: Urgent (48 hours)   Options For Referral: Inpatient Hospitalization; BH Urgent Care     CCA Biopsychosocial Patient Reported Schizophrenia/Schizoaffective Diagnosis in Past: Yes   Strengths: Willingness to seek treatment   Mental Health Symptoms Depression:  Change in energy/activity; Hopelessness   Duration of Depressive symptoms: Duration of Depressive Symptoms: Greater than two weeks   Mania:  None   Anxiety:   None   Psychosis:  Hallucinations (Hearing voices)   Duration of Psychotic symptoms: Duration of Psychotic Symptoms: Greater than six months   Trauma:  None   Obsessions:  None   Compulsions:  None   Inattention:  None   Hyperactivity/Impulsivity:  None   Oppositional/Defiant Behaviors:  None   Emotional Irregularity:  Chronic feelings of emptiness   Other Mood/Personality Symptoms:  None    Mental Status Exam Appearance and self-care  Stature:  Average   Weight:  Thin   Clothing:  Careless/inappropriate   Grooming:  Normal   Cosmetic use:  Age appropriate   Posture/gait:  Normal   Motor activity:  Not Remarkable   Sensorium  Attention:  Normal   Concentration:  Normal   Orientation:  X5   Recall/memory:  Normal   Affect and Mood  Affect:  Flat; Depressed   Mood:  Depressed; Hopeless   Relating  Eye contact:  Normal   Facial expression:  Depressed   Attitude toward  examiner:  Cooperative   Thought and Language  Speech flow: Clear and Coherent   Thought content:  Appropriate to Mood and Circumstances   Preoccupation:  None   Hallucinations:  Auditory   Organization:  Patent Examiner of Knowledge:  Fair   Intelligence:  Average   Abstraction:  Normal   Judgement:  Fair   Brewing Technologist   Insight:  Fair   Decision Making:  Normal   Social Functioning  Social Maturity:  Impulsive   Social Judgement:  Normal   Stress  Stressors:  Housing; Transitions   Coping Ability:  Normal   Skill Deficits:  Self-care   Supports:  Support needed     Religion: Religion/Spirituality Are You A Religious Person?: No How Might This Affect Treatment?: n/a  Leisure/Recreation: Leisure / Recreation Do You Have Hobbies?: No  Exercise/Diet: Exercise/Diet Do You Exercise?: No Have You Gained or Lost A Significant Amount of Weight in the Past Six Months?: No Do You Follow a Special Diet?: No Do You Have Any Trouble Sleeping?: No   CCA Employment/Education Employment/Work Situation: Employment / Work Situation Employment Situation: Unemployed Patient's Job has Been Impacted by Current Illness: No Has Patient ever Been in Equities Trader?: No  Education: Education Is Patient Currently Attending School?: No Last Grade Completed: 12 Did You Product Manager?: No Did You Have An Individualized Education Program (IIEP): No Did You Have Any Difficulty At [employer]?: No Patient's Education Has Been Impacted by Current Illness: No   CCA Family/Childhood History Family and Relationship History: Family history Marital status: Single Does patient have children?: No  Childhood History:  Childhood History By whom was/is the patient raised?: Mother Did patient suffer any verbal/emotional/physical/sexual abuse as a child?: No Did patient suffer from severe childhood neglect?: No Has patient ever been sexually  abused/assaulted/raped as an adolescent or adult?: No Was the patient ever a victim of a crime or a disaster?: No Witnessed domestic violence?: No Has patient been affected by domestic violence as an adult?: No       CCA Substance Use Alcohol/Drug Use: Alcohol / Drug Use Pain Medications: See MAR Prescriptions: See MAR Over the Counter: See MAR History of alcohol / drug use?: No history of alcohol / drug abuse Longest period of sobriety (when/how long): denies alcohol or drug use Negative Consequences of Use:  (n/a) Withdrawal Symptoms:  (n/a)  ASAM's:  Six Dimensions of Multidimensional Assessment  Dimension 1:  Acute Intoxication and/or Withdrawal Potential:      Dimension 2:  Biomedical Conditions and Complications:      Dimension 3:  Emotional, Behavioral, or Cognitive Conditions and Complications:     Dimension 4:  Readiness to Change:     Dimension 5:  Relapse, Continued use, or Continued Problem Potential:     Dimension 6:  Recovery/Living Environment:     ASAM Severity Score:    ASAM Recommended Level of Treatment: ASAM Recommended Level of Treatment:  (n/a)   Substance use Disorder (SUD) Substance Use Disorder (SUD)  Checklist Symptoms of Substance Use:  (n/a)  Recommendations for Services/Supports/Treatments: Recommendations for Services/Supports/Treatments Recommendations For Services/Supports/Treatments: Medication Management, Inpatient Hospitalization (n/a)  Disposition Recommendation per psychiatric provider: We recommend inpatient psychiatric hospitalization when medically cleared. Patient is under voluntary admission status at this time; please IVC if attempts to leave hospital.   DSM5 Diagnoses: Patient Active Problem List   Diagnosis Date Noted   Needs assistance with community resources 05/13/2023   Adjustment disorder with anxious mood 05/13/2023   Encounter for medication management 05/13/2023   Insomnia  05/13/2023   Spinal stenosis of lumbar region 09/05/2022   Protrusion of lumbar intervertebral disc 03/19/2022   Hypertensive urgency 10/20/2021   Tobacco abuse 10/20/2021   Right arm numbness 10/20/2021   Elevated troponin 10/20/2021   Other bipolar disorder (HCC) 10/20/2021   Psoriasis    Right lumbar radiculopathy 12/24/2017   Hypertension 12/24/2012   Arthritis 12/25/2007     Referrals to Alternative Service(s): Referred to Alternative Service(s):   Place:   Date:   Time:    Referred to Alternative Service(s):   Place:   Date:   Time:    Referred to Alternative Service(s):   Place:   Date:   Time:    Referred to Alternative Service(s):   Place:   Date:   Time:     Rosina PARAS, [place], [facility], NCC

## 2023-12-31 NOTE — ED Notes (Addendum)
 Charge Nurse Nicolette Bang attempted to contact [facility] CN Jamie x 5 to give report w/o answer. [facility] receptionist and CN unable to make contact. Patient d/c to [facility] ED via 911 in stable condition with two EMS present.
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
## 2023-12-31 NOTE — ED Provider Notes (Addendum)
 [facility] Medical Screening Exam   Date: 12/31/23 Patient Name: Randy Collins MRN: [id number] Chief Complaint: suicidal and homicidal ideations   Diagnoses:  Final diagnoses:  Current severe episode of major depressive disorder with psychotic features, unspecified whether recurrent (HCC)    HPI: patient presented to [facility] as a walk in  unaccompanied with complaints of suicidal and homicidal ideations.  Randy Collins, 63 y.o., male patient seen face to face by this provider, consulted with Dr. Zouev; and chart reviewed on 12/31/23.    On evaluation [name] reports a past psychiatric history of schizophrenia, depression, and anxiety.  He has been prescribed Seroquel , clonidine , trazodone , baclofen , Cogentin , amlodipine  and Artane in the past.  Reports the only medication he is currently taking is the amlodipine  for his blood pressure however he has not taken medication in over 1 week.SABRA  He is homeless.  He receives a disability check roughly $317 per month.  Patient reports a long history of substance abuse.  He reports a 10-year period of sobriety but admits that's when he was incarcerated.  He relapsed within a week of being home and that was May/2019.  Since that time he uses/snorts cocaine daily.  He snorts up to 20 times per day.  His last use was this a.m.  He occasionally smokes marijuana and drinks 1-2 beers.  He is interested in a residential substance abuse treatment once discharged from the psychiatric hospital.  During evaluation Randy Collins is observed sitting in the assessment room in no acute distress.  He is disheveled and makes good eye contact.  He is alert/oriented x 4, cooperative, and attentive.  His speech is clear, coherent, at a normal rate and tone.  He endorses depression with feelings of hopelessness, irritability, tearfulness, decreased focus, decreased appetite and sleep.  He has only slept 1 hour in the past 2 days.  He has a depressed affect.   He contributes this to being on a cocaine binge for the past week.  He endorses suicidal ideations and homicidal ideations with intent but no plan or access to means.  He does not identify any specific person that he would want to harm.  He endorses auditory hallucinations of hearing voices that tell him to kill other people.  He denies visual hallucinations.  He endorses some paranoia.  He does not appear to be responding to internal/external stimuli.  There is no distractibility or preoccupation.   He does not appear paranoid, psychotic, or manic.  He is able to converse coherently.  Discussed inpatient psychiatric admission and patient is agreeable.  Discussed starting Seroquel  50 mg nightly, as he has taken this medication in the past.  Educated patient on medications and any adverse reactions.  He verbalizes understanding and is in in agreement to start medication.  Patient's EKG results are abnormal.  Reviewed with Dr. Zouev, recommendation is to transfer patient to [facility] ED for medical clearance.  Dr. Nanavati contacted and has agreed to accept patient.  Patient reporting right upper chest tightness 3/10.  Denies any shortness of breath, headache  or dizziness.  Total Time spent with patient: 30 minutes  Musculoskeletal  Strength & Muscle Tone: within normal limits Gait & Station: normal Patient leans: N/A  Psychiatric Specialty Exam  Presentation General Appearance:  Disheveled  Eye Contact: Good  Speech: Clear and Coherent; Normal Rate  Speech Volume: Normal  Handedness: Right   Mood and Affect  Mood: Depressed; Anxious  Affect: Congruent  Thought Process  Thought Processes: Coherent  Descriptions of Associations:Intact  Orientation:Full (Time, Place and Person)  Thought Content:Logical    Hallucinations:Hallucinations: Auditory Description of Auditory Hallucinations: hears voices that tell him to kill others  Ideas of Reference:Paranoia  Suicidal  Thoughts:Suicidal Thoughts: Yes, Active SI Active Intent and/or Plan: With Intent; Without Plan; Without Means to Carry Out  Homicidal Thoughts:Homicidal Thoughts: Yes, Active HI Active Intent and/or Plan: With Intent; Without Plan; Without Means to Carry Out   Sensorium  Memory: Immediate Good; Recent Good; Remote Good  Judgment: Poor  Insight: Poor   Executive Functions  Concentration: Good  Attention Span: Good  Recall: Good  Fund of Knowledge: Good  Language: Good   Psychomotor Activity  Psychomotor Activity: Psychomotor Activity: Normal   Assets  Assets: Communication Skills; Desire for Improvement; Leisure Time; Physical Health; Resilience   Sleep  Sleep: Number of Hours of Sleep: 1   Nutritional Assessment (For OBS and [facility] admissions only) Has the patient had a weight loss or gain of 10 pounds or more in the last 3 months?: Yes Has the patient had a decrease in food intake/or appetite?: Yes Does the patient have dental problems?: No Does the patient have eating habits or behaviors that may be indicators of an eating disorder including binging or inducing vomiting?: No Has the patient recently lost weight without trying?: 2.0 Has the patient been eating poorly because of a decreased appetite?: 1 Malnutrition Screening Tool Score: 3    Physical Exam Vitals and nursing note reviewed.  Constitutional:      Appearance: Normal appearance.  Eyes:     General:        Right eye: No discharge.        Left eye: No discharge.     Conjunctiva/sclera: Conjunctivae normal.  Cardiovascular:     Rate and Rhythm: Normal rate.  Pulmonary:     Effort: No respiratory distress.  Musculoskeletal:        General: Normal range of motion.     Cervical back: Normal range of motion.  Neurological:     Mental Status: He is alert and oriented to person, place, and time.  Psychiatric:        Attention and Perception: He perceives auditory hallucinations.         Mood and Affect: Mood is anxious and depressed.        Behavior: Behavior is cooperative.        Thought Content: Thought content is paranoid. Thought content includes homicidal and suicidal ideation. Thought content does not include homicidal or suicidal plan.        Cognition and Memory: Cognition normal.        Judgment: Judgment normal.    Review of Systems  Constitutional:  Positive for weight loss. Negative for chills and fever.  HENT:  Negative for hearing loss.   Respiratory:  Negative for cough and shortness of breath.   Cardiovascular:  Negative for chest pain.  Musculoskeletal:  Positive for back pain.  Neurological:  Negative for dizziness, tremors, speech change and seizures.  Psychiatric/Behavioral:  Positive for depression, hallucinations, substance abuse and suicidal ideas. The patient is nervous/anxious and has insomnia.     Blood pressure (!) 150/98, pulse 70, temperature 97.8 F (36.6 C), temperature source Oral, resp. rate 20, SpO2 98%. There is no height or weight on file to calculate BMI.  Past Psychiatric History: Patient reports a history of schizophrenia, depression, anxiety. -Per chart review not verified  Is the  patient at risk to self? Yes  Has the patient been a risk to self in the past 6 months? Yes .    Has the patient been a risk to self within the distant past? Yes   Is the patient a risk to others? Yes   Has the patient been a risk to others in the past 6 months? No   Has the patient been a risk to others within the distant past? No   Past Medical History:  Past Medical History:  Diagnosis Date   Arthritis 2009   likely psoriatic arthritis--inflammatory, but not symmetric, left hip and back, right elbow, both knees   Hypertension 2014   Psoriasis 2009   Right lumbar radiculopathy 2019     Family History: Unsure Social History:  Homeless Unemployed-receives disability 10th grade education Daily cocaine use Occasional marijuana and  alcohol use No military history No recent falls Single, with no children  Last Labs:  Admission on 10/17/2023, Discharged on 10/17/2023  Component Date Value Ref Range Status   Sodium 10/17/2023 136  135 - 145 mmol/L Final   Potassium 10/17/2023 3.6  3.5 - 5.1 mmol/L Final   Chloride 10/17/2023 106  98 - 111 mmol/L Final   CO2 10/17/2023 20 (L)  22 - 32 mmol/L Final   Glucose, Bld 10/17/2023 112 (H)  70 - 99 mg/dL Final   Glucose reference range applies only to samples taken after fasting for at least 8 hours.   BUN 10/17/2023 27 (H)  8 - 23 mg/dL Final   Creatinine, Ser 10/17/2023 1.69 (H)  0.61 - 1.24 mg/dL Final   Calcium  10/17/2023 8.7 (L)  8.9 - 10.3 mg/dL Final   GFR, Estimated 10/17/2023 45 (L)  >60 mL/min Final   Comment: (NOTE) Calculated using the CKD-EPI Creatinine Equation (2021)    Anion gap 10/17/2023 10  5 - 15 Final   Performed at [facility], [address], [place], [place] [address]   WBC 10/17/2023 8.0  4.0 - 10.5 K/uL Final   RBC 10/17/2023 4.93  4.22 - 5.81 MIL/uL Final   Hemoglobin 10/17/2023 13.9  13.0 - 17.0 g/dL Final   HCT 89/75/7975 43.6  39.0 - 52.0 % Final   MCV 10/17/2023 88.4  80.0 - 100.0 fL Final   MCH 10/17/2023 28.2  26.0 - 34.0 pg Final   MCHC 10/17/2023 31.9  30.0 - 36.0 g/dL Final   RDW 89/75/7975 15.5  11.5 - 15.5 % Final   Platelets 10/17/2023 252  150 - 400 K/uL Final   nRBC 10/17/2023 0.0  0.0 - 0.2 % Final   Performed at [facility], [address], [place], [place] [address]   Troponin I (High Sensitivity) 10/17/2023 23 (H)  <18 ng/L Final   Comment: (NOTE) Elevated high sensitivity troponin I (hsTnI) values and significant  changes across serial measurements may suggest ACS but many other  chronic and acute conditions are known to elevate hsTnI results.  Refer to the Links section for chest pain algorithms and additional  guidance. Performed at [facility], [address] [place], [place] [address]     Troponin I (High Sensitivity) 10/17/2023 24 (H)  <18 ng/L Final   Comment: (NOTE) Elevated high sensitivity troponin I (hsTnI) values and significant  changes across serial measurements may suggest ACS but many other  chronic and acute conditions are known to elevate hsTnI results.  Refer to the Links section for chest pain algorithms and additional  guidance. Performed at [facility], [address], [place], [place] [address]     Allergies: Patient has no known allergies.  Medications:  Facility Ordered Medications  Medication   acetaminophen  (TYLENOL ) tablet 650 mg   alum & mag hydroxide-simeth (MAALOX/MYLANTA) 200-200-20 MG/5ML suspension 30 mL   magnesium  hydroxide (MILK OF MAGNESIA) suspension 30 mL   hydrOXYzine  (ATARAX ) tablet 25 mg   traZODone  (DESYREL ) tablet 50 mg   amLODipine  (NORVASC ) tablet 10 mg   PTA Medications  Medication Sig   cetirizine  (ZYRTEC ) 10 MG tablet Take 10 mg by mouth at bedtime as needed for allergies.   meloxicam  (MOBIC ) 7.5 MG tablet Take 7.5 mg by mouth 2 (two) times daily as needed.   tiZANidine (ZANAFLEX) 4 MG tablet Take 4 mg by mouth at bedtime as needed.   aspirin  81 MG EC tablet Take 1 tablet (81 mg total) by mouth daily. Swallow whole. (Patient not taking: Reported on 10/17/2023)   atorvastatin  (LIPITOR) 40 MG tablet Take 1 tablet (40 mg total) by mouth daily. (Patient not taking: Reported on 10/17/2023)   traZODone  (DESYREL ) 50 MG tablet Take 1 tablet (50 mg total) by mouth at bedtime as needed for sleep. (Patient not taking: Reported on 10/17/2023)   QUEtiapine  (SEROQUEL ) 50 MG tablet Take 1 tablet (50 mg total) by mouth at bedtime. (Patient not taking: Reported on 10/17/2023)   naproxen  sodium (ANAPROX  DS) 550 MG tablet Take 1 tablet (550 mg total) by mouth 2 (two) times daily with a meal. (Patient not taking: Reported on 10/17/2023)   Baclofen  5 MG TABS Take 1 tablet (5 mg total) by mouth at bedtime as needed. (Patient not  taking: Reported on 10/17/2023)   amLODipine  (NORVASC ) 10 MG tablet Take 1 tablet (10 mg total) by mouth daily.   cloNIDine  (CATAPRES ) 0.1 MG tablet Take 1 tablet (0.1 mg total) by mouth at bedtime.      Medical Decision Making  Patient presents to [facility] with suicidal/homicidal ideations and cannot contract for safety.  In addition he is experiencing paranoia and auditory hallucinations.  He is recommended for inpatient psychiatric admission.  He will be admitted to the continuous assessment unit while awaiting inpatient psychiatric bed availability    Recommendations  Based on my evaluation the patient appears to have an emergency medical condition for which I recommend the patient be transferred to the emergency department for further evaluation.  Patient's EKG results are abnormal.  Reviewed with Dr. Zouev, recommendation is to transfer patient to [facility] ED for medical clearance.  Dr. Nanavati contacted and has agreed to accept patient.  Patient recommended for inpatient psychiatric admission.  He will be admitted to the continuous assessment unit while awaiting inpatient psychiatric bed availability  [facility] notified-patient is under review-once patient is medically clear please contact [facility] to determine if there is bed availability.  Medications:      Seroquel  50 mg nightly-mood stabilization/psychotic symptoms      Amlodipine  10 mg daily-hypertension  acetaminophen  (TYLENOL ) tablet 650 mg Q6H for pain   alum & mag hydroxide-simeth (MAALOX/MYLANTA) 200-200-20 MG/5ML suspension 30 mL   magnesium  hydroxide (MILK OF MAGNESIA) suspension 30 mL   hydrOXYzine  (ATARAX ) tablet 25 mg TID PRN for Anxiety   traZODone  (DESYREL ) tablet 50 mg at bedtime for sleep     Lab Orders         CBC with Differential/Platelet         Comprehensive metabolic panel  Hemoglobin A1c         Ethanol         Magnesium          Lipid panel         TSH         Hepatic function panel         T3,  free         T4, free         POCT Urine Drug Screen - (I-Screen)         Vitamin B12   EKG  Jaymond Waage H Brooklee Michelin, NP 12/31/23  2:44 PM

## 2024-01-01 ENCOUNTER — Observation Stay (HOSPITAL_COMMUNITY): Payer: Medicaid Other

## 2024-01-01 ENCOUNTER — Observation Stay (HOSPITAL_BASED_OUTPATIENT_CLINIC_OR_DEPARTMENT_OTHER)
Admit: 2024-01-01 | Discharge: 2024-01-01 | Disposition: A | Discharge status: Home / Self Care | Payer: Medicaid Other | Attending: Cardiology | Admitting: Cardiology

## 2024-01-01 ENCOUNTER — Other Ambulatory Visit: Payer: Self-pay | Admitting: Cardiology

## 2024-01-01 DIAGNOSIS — R0789 Other chest pain: Secondary | ICD-10-CM

## 2024-01-01 DIAGNOSIS — R7303 Prediabetes: Secondary | ICD-10-CM

## 2024-01-01 DIAGNOSIS — E7800 Pure hypercholesterolemia, unspecified: Secondary | ICD-10-CM

## 2024-01-01 DIAGNOSIS — N1831 Chronic kidney disease, stage 3a: Secondary | ICD-10-CM | POA: Diagnosis not present

## 2024-01-01 DIAGNOSIS — R931 Abnormal findings on diagnostic imaging of heart and coronary circulation: Secondary | ICD-10-CM | POA: Diagnosis not present

## 2024-01-01 DIAGNOSIS — N183 Chronic kidney disease, stage 3 unspecified: Secondary | ICD-10-CM | POA: Diagnosis present

## 2024-01-01 DIAGNOSIS — I10 Essential (primary) hypertension: Secondary | ICD-10-CM

## 2024-01-01 DIAGNOSIS — I2510 Atherosclerotic heart disease of native coronary artery without angina pectoris: Secondary | ICD-10-CM | POA: Diagnosis not present

## 2024-01-01 DIAGNOSIS — F419 Anxiety disorder, unspecified: Secondary | ICD-10-CM

## 2024-01-01 DIAGNOSIS — F32A Depression, unspecified: Secondary | ICD-10-CM

## 2024-01-01 DIAGNOSIS — R079 Chest pain, unspecified: Secondary | ICD-10-CM | POA: Diagnosis not present

## 2024-01-01 DIAGNOSIS — F209 Schizophrenia, unspecified: Secondary | ICD-10-CM

## 2024-01-01 DIAGNOSIS — F1910 Other psychoactive substance abuse, uncomplicated: Secondary | ICD-10-CM

## 2024-01-01 LAB — ECHOCARDIOGRAM COMPLETE
Area-P 1/2: 4.06 cm2
Calc EF: 71.4 %
Height: 70 in
S' Lateral: 2.35 cm
Single Plane A2C EF: 77.3 %
Single Plane A4C EF: 63.2 %
Weight: 2400 [oz_av]

## 2024-01-01 LAB — HEMOGLOBIN A1C
Hgb A1c MFr Bld: 5.8 % — ABNORMAL HIGH (ref 4.8–5.6)
Mean Plasma Glucose: 120 mg/dL

## 2024-01-01 LAB — HIV ANTIBODY (ROUTINE TESTING W REFLEX): HIV Screen 4th Generation wRfx: NONREACTIVE

## 2024-01-01 LAB — T3, FREE: T3, Free: 2.4 pg/mL (ref 2.0–4.4)

## 2024-01-01 LAB — D-DIMER, QUANTITATIVE: D-Dimer, Quant: <0.27 ug{FEU}/mL (ref 0.00–0.50)

## 2024-01-01 MED ORDER — ATORVASTATIN CALCIUM 40 MG PO TABS
40.0000 mg | ORAL_TABLET | Freq: Every day | ORAL | Status: DC
  Administered 2024-01-01: 40 mg via ORAL
  Filled 2024-01-01: qty 1

## 2024-01-01 MED ORDER — NITROGLYCERIN 0.4 MG SL SUBL
0.8000 mg | SUBLINGUAL_TABLET | Freq: Once | SUBLINGUAL | Status: AC
  Administered 2024-01-01: 0.8 mg via SUBLINGUAL

## 2024-01-01 MED ORDER — ACETAMINOPHEN 325 MG PO TABS
650.0000 mg | ORAL_TABLET | Freq: Once | ORAL | Status: AC
  Administered 2024-01-01: 650 mg via ORAL
  Filled 2024-01-01: qty 2

## 2024-01-01 MED ORDER — IRBESARTAN 150 MG PO TABS
150.0000 mg | ORAL_TABLET | Freq: Every day | ORAL | Status: DC
  Administered 2024-01-01 – 2024-01-02 (×2): 150 mg via ORAL
  Filled 2024-01-01 (×2): qty 1

## 2024-01-01 MED ORDER — LORATADINE 10 MG PO TABS
10.0000 mg | ORAL_TABLET | Freq: Every day | ORAL | Status: DC
  Administered 2024-01-01 – 2024-01-02 (×2): 10 mg via ORAL
  Filled 2024-01-01 (×2): qty 1

## 2024-01-01 MED ORDER — ENOXAPARIN SODIUM 40 MG/0.4ML IJ SOSY
40.0000 mg | PREFILLED_SYRINGE | INTRAMUSCULAR | Status: DC
  Administered 2024-01-01 – 2024-01-02 (×2): 40 mg via SUBCUTANEOUS
  Filled 2024-01-01 (×2): qty 0.4

## 2024-01-01 MED ORDER — AMLODIPINE BESYLATE 5 MG PO TABS
10.0000 mg | ORAL_TABLET | Freq: Once | ORAL | Status: AC
  Administered 2024-01-01: 10 mg via ORAL
  Filled 2024-01-01: qty 2

## 2024-01-01 MED ORDER — ATORVASTATIN CALCIUM 80 MG PO TABS: 80.0000 mg | ORAL_TABLET | Freq: Every day | ORAL | Status: DC

## 2024-01-01 MED ORDER — ASPIRIN 81 MG PO TBEC
81.0000 mg | DELAYED_RELEASE_TABLET | Freq: Every day | ORAL | Status: DC
  Administered 2024-01-01 – 2024-01-02 (×2): 81 mg via ORAL
  Filled 2024-01-01 (×2): qty 1

## 2024-01-01 MED ORDER — NICOTINE 14 MG/24HR TD PT24
14.0000 mg | MEDICATED_PATCH | Freq: Every day | TRANSDERMAL | Status: DC
  Administered 2024-01-01 – 2024-01-02 (×2): 14 mg via TRANSDERMAL
  Filled 2024-01-01 (×2): qty 1

## 2024-01-01 MED ORDER — IOHEXOL 350 MG/ML SOLN
95.0000 mL | Freq: Once | INTRAVENOUS | Status: AC | PRN
  Administered 2024-01-01: 95 mL via INTRAVENOUS

## 2024-01-01 MED ORDER — NITROGLYCERIN 0.4 MG SL SUBL
SUBLINGUAL_TABLET | SUBLINGUAL | Status: AC
  Filled 2024-01-01: qty 2

## 2024-01-01 MED ORDER — NITROGLYCERIN 0.4 MG SL SUBL: 0.4000 mg | SUBLINGUAL_TABLET | SUBLINGUAL | Status: DC | PRN

## 2024-01-01 MED ORDER — ACETAMINOPHEN 325 MG PO TABS: 650.0000 mg | ORAL_TABLET | ORAL | Status: DC | PRN

## 2024-01-01 MED ORDER — SODIUM CHLORIDE 0.9 % IV SOLN: INTRAVENOUS | Status: AC

## 2024-01-01 MED ORDER — ONDANSETRON HCL 4 MG/2ML IJ SOLN: 4.0000 mg | Freq: Four times a day (QID) | INTRAMUSCULAR | Status: DC | PRN

## 2024-01-01 NOTE — ED Notes (Signed)
 Patient transported to CT

## 2024-01-01 NOTE — ED Provider Notes (Signed)
 [facility] Provider Note   CSN: [id number] Arrival date & time: 12/31/23  1712     History  Chief Complaint  Patient presents with   Chest Pain    Randy Collins is a 63 y.o. male with a past medical history significant for hypertension, cocaine abuse, tobacco abuse, schizophrenia, depression, and anxiety who presents to the ED from [facility] due to right-sided chest pain.  Patient states chest pain started yesterday when he was lying down at the behavioral health urgent care.  Describes chest pain as a pressure-like sensation on the right side of his chest.  Chest pain has been constant since yesterday however, has improved in severity.  Uses cocaine daily.  Last used cocaine yesterday.  Patient states he has had a small heart attack in the past.  Patient has not taken his amlodipine  in 1 week.  Denies associated shortness of breath, nausea, vomiting, and diaphoresis.  Denies lower extremity edema.  No history of blood clots.  Patient was given 1 nitroglycerin  and ASA by EMS prior to arrival.  History obtained from patient and past medical records. No interpreter used during encounter.       Home Medications Prior to Admission medications   Medication Sig Start Date End Date Taking? Authorizing Provider  amLODipine  (NORVASC ) 10 MG tablet Take 1 tablet (10 mg total) by mouth daily. 10/17/23   Horton, Charmaine FALCON, MD  aspirin  81 MG EC tablet Take 1 tablet (81 mg total) by mouth daily. Swallow whole. 11/29/21   Whitfield Raisin, NP  atorvastatin  (LIPITOR) 40 MG tablet Take 1 tablet (40 mg total) by mouth daily. 11/29/21   Whitfield Raisin, NP  cetirizine  (ZYRTEC ) 10 MG tablet Take 10 mg by mouth daily. 12/09/23   [provider]  meloxicam  (MOBIC ) 7.5 MG tablet Take 7.5 mg by mouth daily. 12/09/23   [provider]  tiZANidine (ZANAFLEX) 4 MG tablet Take 4 mg by mouth at bedtime. 12/09/23   [provider]  Vitamins A & D  (VITAMIN A & D) ointment Apply 1 Application topically as needed for dry skin.    [provider]      Allergies    Patient has no known allergies.    Review of Systems   Review of Systems  Constitutional:  Negative for chills and fever.  Respiratory:  Negative for shortness of breath.   Cardiovascular:  Positive for chest pain. Negative for leg swelling.  Gastrointestinal:  Negative for abdominal pain.    Physical Exam Updated Vital Signs BP (!) 162/99   Pulse (!) 57   Temp 98.3 F (36.8 C)   Resp 18   Ht 5' 10 (1.778 m)   Wt 68 kg   SpO2 100%   BMI 21.52 kg/m  Physical Exam Vitals and nursing note reviewed.  Constitutional:      General: He is not in acute distress.    Appearance: He is not ill-appearing.  HENT:     Head: Normocephalic.  Eyes:     Pupils: Pupils are equal, round, and reactive to light.  Cardiovascular:     Rate and Rhythm: Normal rate and regular rhythm.     Pulses: Normal pulses.     Heart sounds: Normal heart sounds. No murmur heard.    No friction rub. No gallop.  Pulmonary:     Effort: Pulmonary effort is normal.     Breath sounds: Normal breath sounds.  Abdominal:  General: Abdomen is flat. There is no distension.     Palpations: Abdomen is soft.     Tenderness: There is no abdominal tenderness. There is no guarding or rebound.  Musculoskeletal:        General: Normal range of motion.     Cervical back: Neck supple.  Skin:    General: Skin is warm and dry.  Neurological:     General: No focal deficit present.     Mental Status: He is alert.  Psychiatric:        Mood and Affect: Mood normal.        Behavior: Behavior normal.     ED Results / Procedures / Treatments   Labs (all labs ordered are listed, but only abnormal results are displayed) Labs Reviewed  TROPONIN I (HIGH SENSITIVITY)    EKG EKG Interpretation Date/Time:  Tuesday December 31 2023 17:19:42 EST Ventricular Rate:  68 PR Interval:  112 QRS  Duration:  84 QT Interval:  396 QTC Calculation: 421 R Axis:   64  Text Interpretation: Normal sinus rhythm Right atrial enlargement Minimal voltage criteria for LVH, may be normal variant ( Sokolow-Lyon ) ST & T wave abnormality, consider inferolateral ischemia Abnormal ECG When compared with ECG of 31-Dec-2023 14:59, PREVIOUS ECG IS PRESENT Confirmed by Levander Houston [phone]) on 01/01/2024 9:23:15 AM  Radiology DG Chest 2 View Result Date: 12/31/2023 CLINICAL DATA:  Chest pain EXAM: CHEST - 2 VIEW COMPARISON:  X-ray 10/17/2023 and older FINDINGS: Hyperinflation with some chronic lung changes. No consolidation, pneumothorax or effusion. No edema. Normal cardiopericardial silhouette. Calcified aorta. Degenerative changes along the spine. IMPRESSION: Hyperinflation with chronic changes. No acute cardiopulmonary disease. Electronically Signed   By: Ranell Bring M.D.   On: 12/31/2023 17:51    Procedures Procedures    Medications Ordered in ED Medications  acetaminophen  (TYLENOL ) tablet 650 mg (650 mg Oral Given 01/01/24 0950)  amLODipine  (NORVASC ) tablet 10 mg (10 mg Oral Given 01/01/24 0950)    ED Course/ Medical Decision Making/ A&P Clinical Course as of 01/01/24 0959  Wed Jan 01, 2024  0950 Discussed with Callie, PA-C with cardiology who reviewed EKG with Dr. Jordan who believes EKG changes likely early repolarization. Given he is still having active chest pain, recommends admission to medicine. Chest pain likely due to cocaine.  [CA]    Clinical Course User Index [CA] Lorelle Aleck BROCKS, PA-C                                 Medical Decision Making Amount and/or Complexity of Data Reviewed External Data Reviewed: notes.    Details: [facility] Labs: ordered. Decision-making details documented in ED Course. Radiology: ordered and independent interpretation performed. Decision-making details documented in ED Course. ECG/medicine tests: ordered and independent interpretation performed.  Decision-making details documented in ED Course.  Risk OTC drugs. Prescription drug management. Decision regarding hospitalization.   This patient presents to the ED for concern of chest pain, this involves an extensive number of treatment options, and is a complaint that carries with it a high risk of complications and morbidity.  The differential diagnosis includes ACS, PE, PNA, dissection, MSK etiology, etc  63 year old male presents to the ED due to right sided chest pain that has been present since yesterday. Chest pain is a pressure-like sensation.  Admits to daily cocaine use.  History of hypertension. Sent from [facility] for ACS clearance.  Unfortunately, patient waited over  16 hours prior to my initial evaluation due to long wait times.  Initial evaluation, stable vitals.  Patient in no acute distress.  Reassuring physical exam.  No reproducible tenderness to right sided chest.  No lower extremity edema.  Negative Homan sign bilaterally. Low suspicion for PE/DVT.  Presentation nonconcerning for aortic dissection.  Possibly related to cocaine abuse.  Routine labs ordered at behavioral health urgent care yesterday.  Troponins ordered here in triage.  Given nitroglycerin  and ASA 324 mg by EMS prior to arrival.  During initial evaluation, patient still admits to some right sided chest pain however, notes it has improved in severity.  Chest x-ray personally reviewed and interpreted which demonstrates hyperinflation with chronic changes.  No acute findings.  Troponin x 2 normal.  Repeated EKG which demonstrates some nonspecific ST wave abnormalities.  Given patient is still having chest pain will discuss with cardiology for further recommendations.  Patient given dose of amlodipine .  Also endorses a slight headache.  Tylenol  given.  Normal neurological exam.  I have reviewed labs from behavioral urgent care.  CBC unremarkable.  No leukocytosis.  Normal hemoglobin.  CMP with elevated creatinine at 1.51 and  BUN at 28.  Appears similar to 2 months ago.  UDS positive for cocaine and THC.  Per [facility], once medically cleared can return to [facility] for admission. Patient still admits to SI. Denies HI and hallucinations  Discussed with cardiology see note above. Cardiology will consult on patient. Will consult hospitalist for admission.   Co morbidities that complicate the patient evaluation  HTN Cardiac Monitoring: / EKG:  The patient was maintained on a cardiac monitor.  I personally viewed and interpreted the cardiac monitored which showed an underlying rhythm of: NSR with ST wave abnormalities  Social Determinants of Health:  Cocaine abuse  Test / Admission - Considered:  Patient will require admission for chest pain rule out.   10:26 AM Discussed with Dr. Claudene with [facility] who agrees to admit patient. Upon reassessment, patient still having chest pain. Patient given another nitroglycerin .   Discussed with Dr. Levander who evaluated patient at bedside and agrees with assessment and plan.        Final Clinical Impression(s) / ED Diagnoses Final diagnoses:  Nonspecific chest pain  Cocaine abuse [facility])    Rx / DC Orders ED Discharge Orders     None         Lorelle Aleck JAYSON DEVONNA 01/01/24 1027    Levander Houston, MD 01/01/24 1534

## 2024-01-01 NOTE — Consult Note (Signed)
 Cardiology Consultation   Patient ID: Randy Collins MRN: [id number]; DOB: 09-19-1961  Admit date: 12/31/2023 Date of Consult: 01/01/2024  PCP:  Shelda Atlas, MD   [facility] Providers Cardiologist:  None   {   Patient Profile:   Randy Collins is a 63 y.o. male with a hx of bipolar disorder, PTSD, schizophrenia, HTN, cocaine abuse, tobacco abuse, CKD IIIa,  who is being seen 01/01/2024 for the evaluation of chest pain at the request of Dr Claudene.  History of Present Illness:   [name] with above PMH presented to ER today for c/o chest pain. He reports new onset of right sided chest pain that started yesterday when he was visiting behavioral health urgent care. He states pain is pressure like, non-radiating, and lasted until this morning. He felt pain is resolved now. He denied any SOB, nausea, emesis, paresthesia, dizziness, syncope, rapid weight gain, leg edema, orthopnea.  He reports hx of small heart attack 1-2 years ago and believes he was here at [facility] and underwent a procedure where we accessed his groin. However there is no records of that. He is on amlodipine  for HTN but has not taken it for a week due to no supply. He admits using cocaine whenever he can get access to it,  with last use yesterday. He smokes average 2 cigarettes daily and drinks 1 beer daily. He reports his brother died of heart attack at age of 11. He takes ASA 81mg  and lipitor 40mg  daily.   Admission labs showed Cr 1.51, BUN28, GFR 52. Hs trop 16 >16. LDL 89. CBC diff unremarkable. UDS + cocaine and marijuana. CXR showed Hyperinflation with chronic changes. No acute cardiopulmonary disease. EKGs showed sinus bradycardia, LVH, new TWI of inferolateral leads, old ST elevation of anteroseptal leads. He is hypertensive with BP up to 162/99 at ED. Cardiology is consulted for chest pain today.   Echo was done 10/21/21 during hospitalization for possible TIA, CVA ruled out, and HTN urgency. It  showed LVEF 60-65%, no RWMA, normal RV, normal RV and PASP 19.8 mmHg, mild MR. Repeat Echo is pending today.     Past Medical History:  Diagnosis Date   Arthritis 2009   likely psoriatic arthritis--inflammatory, but not symmetric, left hip and back, right elbow, both knees   Hypertension 2014   Psoriasis 2009   Right lumbar radiculopathy 2019    History reviewed. No pertinent surgical history.   Home Medications:  Prior to Admission medications   Medication Sig Start Date End Date Taking? Authorizing Provider  amLODipine  (NORVASC ) 10 MG tablet Take 1 tablet (10 mg total) by mouth daily. 10/17/23   Horton, Charmaine FALCON, MD  aspirin  81 MG EC tablet Take 1 tablet (81 mg total) by mouth daily. Swallow whole. 11/29/21   Whitfield Raisin, NP  atorvastatin  (LIPITOR) 40 MG tablet Take 1 tablet (40 mg total) by mouth daily. 11/29/21   Whitfield Raisin, NP  cetirizine  (ZYRTEC ) 10 MG tablet Take 10 mg by mouth daily. 12/09/23   [provider]  meloxicam  (MOBIC ) 7.5 MG tablet Take 7.5 mg by mouth daily. 12/09/23   [provider]  tiZANidine (ZANAFLEX) 4 MG tablet Take 4 mg by mouth at bedtime. 12/09/23   [provider]  Vitamins A & D (VITAMIN A & D) ointment Apply 1 Application topically as needed for dry skin.    [provider]    Inpatient Medications: Scheduled Meds:  enoxaparin  (LOVENOX ) injection  40 mg Subcutaneous  Q24H   Continuous Infusions:  PRN Meds: acetaminophen , nitroGLYCERIN , ondansetron  (ZOFRAN ) IV  Allergies:   No Known Allergies  Social History:   Social History   Socioeconomic History   Marital status: Single    Spouse name: Not on file   Number of children: 1   Years of education: Not on file   Highest education level: 9th grade  Occupational History   Not on file  Tobacco Use   Smoking status: Every Day    Current packs/day: 1.00    Types: Cigarettes   Smokeless tobacco: Never   Tobacco comments:    getting [facility] program at  [facility]  Vaping Use   Vaping status: Never Used  Substance and Sexual Activity   Alcohol use: Yes    Comment: History of abuse.  Occasional beer now.   Drug use: Not Currently    Comment: history of speed, MJ, Acid.  Last time use was 1999   Sexual activity: Not Currently    Comment: retired Has ED  Other Topics Concern   Not on file  Social History Narrative   Not on file   Social Drivers of Health   Financial Resource Strain: Not on file  Food Insecurity: Food Insecurity Present (12/31/2023)   Hunger Vital Sign    Worried About Running Out of Food in the Last Year: Often true    Ran Out of Food in the Last Year: Often true  Transportation Needs: No Transportation Needs (12/31/2023)   PRAPARE - Administrator, Civil Service (Medical): No    Lack of Transportation (Non-Medical): No  Physical Activity: Not on file  Stress: Stress Concern Present (10/31/2018)   [employer] of Occupational Health - Occupational Stress Questionnaire    Feeling of Stress : To some extent  Social Connections: Not on file  Intimate Partner Violence: Not At Risk (12/31/2023)   Humiliation, Afraid, Rape, and Kick questionnaire    Fear of Current or Ex-Partner: No    Emotionally Abused: No    Physically Abused: No    Sexually Abused: No    Family History:   Family History  Problem Relation Age of Onset   Hypertension Mother    Arthritis Mother    Gout Mother    Hyperlipidemia Mother    Gout Father    Alcohol abuse Father        probable cirrhosis   Heart disease Brother 46       AMI   HIV/AIDS Brother    Cancer Brother        not sure what primary     ROS:  Constitutional: Denied fever, chills, malaise, night sweats Eyes: Denied vision change or loss Ears/Nose/Mouth/Throat: Denied ear ache, sore throat, coughing, sinus pain Cardiovascular: see HPI  Respiratory: denied shortness of breath Gastrointestinal: Denied nausea, vomiting, abdominal pain,  diarrhea Genital/Urinary: Denied dysuria, hematuria, urinary frequency/urgency Musculoskeletal: Denied muscle ache, joint pain, weakness Skin: Denied rash, wound Neuro: Denied headache, dizziness, syncope Psych: history of depression/anxiety  Endocrine: Denied history of diabetes   Physical Exam/Data:   Vitals:   01/01/24 0013 01/01/24 0545 01/01/24 0855 01/01/24 0950  BP: (!) 149/89 (!) 152/89 (!) 162/99 (!) 162/99  Pulse: (!) 59 (!) 52 (!) 57   Resp: 18 18 18    Temp: 98 F (36.7 C) 98.6 F (37 C) 98.3 F (36.8 C)   TempSrc:      SpO2: 98% 100% 100%   Weight:      Height:  No intake or output data in the 24 hours ending 01/01/24 1239    12/31/2023    5:15 PM 10/17/2023    4:07 AM 05/15/2023    9:38 AM  Last 3 Weights  Weight (lbs) 150 lb 160 lb   Weight (kg) 68.04 kg 72.576 kg      Information is confidential and restricted. Go to Review Flowsheets to unlock data.     Body mass index is 21.52 kg/m.   Vitals:  Vitals:   01/01/24 0950 01/01/24 1230  BP: (!) 162/99 139/79  Pulse:  (!) 51  Resp:  17  Temp:    SpO2:  96%   General Appearance: In no apparent distress, laying in bed HEENT: Normocephalic, atraumatic.  Neck: Supple, trachea midline, no JVDs Cardiovascular: Regular rate and rhythm, normal S1-S2,  no murmur Respiratory: Resting breathing unlabored, lungs sounds clear to auscultation bilaterally, no use of accessory muscles. On room air.  No wheezes, rales or rhonchi.   Gastrointestinal: Bowel sounds positive, abdomen soft, non-tender, non-distended.  Extremities: Able to move all extremities in bed without difficulty, no leg edema Musculoskeletal: Normal muscle bulk and tone Skin: Intact, warm, dry. No rashes or petechiae noted in exposed areas.  Neurologic: Alert, oriented to person, place and time, no focal muscle weakness, no gross focal neuro deficit Psychiatric: Normal affect. Mood is appropriate.    EKG:  The EKG was personally reviewed and  demonstrates:    EKG from 12/31/23 14:57 showed sinus bradycardia 58bpm, LVH, inferolateral TWI, ST elevation of anteroseptal leads, PVC  EKG from 12/31/23 17:19 showed sinus rhythm 68bpm, LVH, inferolateral TWI, ST elevation of anteroseptal leads  Telemetry:  Telemetry was personally reviewed and demonstrates:    Sinus bradycardia 50s   Relevant CV Studies:   Echo 10/21/21:   1. Left ventricular ejection fraction, by estimation, is 60 to 65%. The  left ventricle has normal function. The left ventricle has no regional  wall motion abnormalities. Left ventricular diastolic function could not  be evaluated.   2. Right ventricular systolic function is normal. The right ventricular  size is normal. There is normal pulmonary artery systolic pressure. The  estimated right ventricular systolic pressure is 19.8 mmHg.   3. The mitral valve is normal in structure. Mild mitral valve  regurgitation. No evidence of mitral stenosis.   4. The aortic valve is normal in structure. Aortic valve regurgitation is  not visualized. No aortic stenosis is present.   5. The inferior vena cava is normal in size with greater than 50%  respiratory variability, suggesting right atrial pressure of 3 mmHg.    Laboratory Data:  High Sensitivity Troponin:   Recent Labs  Lab 12/31/23 1450 12/31/23 2043  TROPONINIHS 16 16     Chemistry Recent Labs  Lab 12/31/23 1450  NA 141  K 4.3  CL 105  CO2 27  GLUCOSE 94  BUN 28*  CREATININE 1.51*  CALCIUM  9.0  MG 2.0  GFRNONAA 52*  ANIONGAP 9    Recent Labs  Lab 12/31/23 1450  PROT 6.8  ALBUMIN 3.5  AST 16  ALT 12  ALKPHOS 57  BILITOT 0.5   Lipids  Recent Labs  Lab 12/31/23 1450  CHOL 174  TRIG 60  HDL 73  LDLCALC 89  CHOLHDL 2.4    Hematology Recent Labs  Lab 12/31/23 1450  WBC 6.4  RBC 5.15  HGB 15.4  HCT 47.4  MCV 92.0  MCH 29.9  MCHC 32.5  RDW 13.2  PLT 306   Thyroid   Recent Labs  Lab 12/31/23 1450  TSH 0.767  FREET4  0.92    BNPNo results for input(s): BNP, PROBNP in the last 168 hours.  DDimer No results for input(s): DDIMER in the last 168 hours.   Radiology/Studies:  DG Chest 2 View Result Date: 12/31/2023 CLINICAL DATA:  Chest pain EXAM: CHEST - 2 VIEW COMPARISON:  X-ray 10/17/2023 and older FINDINGS: Hyperinflation with some chronic lung changes. No consolidation, pneumothorax or effusion. No edema. Normal cardiopericardial silhouette. Calcified aorta. Degenerative changes along the spine. IMPRESSION: Hyperinflation with chronic changes. No acute cardiopulmonary disease. Electronically Signed   By: Ranell Bring M.D.   On: 12/31/2023 17:51     Assessment and Plan:   Chest pain - atypical - Hs trop negative x2, ACS ruled out  - EKG with inferolateral TWI and LVH - given risk factor including family hx of premature CAD, HTN, tobacco/cocaine abuse, recommend CT coronary study if remains hospitalized, OK to obtain outpatient if discharged today  - will follow Echo  - continue PTA ASA 81mg  daily, lipitor 40mg  daily, further recommendation pending workup   HTN - ran out of meds for a week - resume PTA amlodipine    Cocaine abuse Tobacco abuse - discussed the need of cessation   CKD IIIa PTSD Bipolar  Schizophrenia  - per primary team       Risk Assessment/Risk Scores:                For questions or updates, please contact [facility] Please consult [email or web address] for contact info under      Signed, Camrin Lapre, NP  01/01/2024 12:39 PM

## 2024-01-01 NOTE — H&P (Signed)
 History and Physical    Patient: Randy Collins FMW:[id number] DOB: 1961/09/15 DOA: 12/31/2023 DOS: the patient was seen and examined on 01/01/2024 PCP: Shelda Atlas, MD  Patient coming from: Transfer from behavioral health  Chief Complaint:  Chief Complaint  Patient presents with   Chest Pain   HPI: Randy Collins is a 63 y.o. male with medical history significant of hypertension, schizophrenia, depression, polysubstance abuse(cocaine, marijuana, and tobacco) who presented with complaints of chest pain around 4 PM yesterday. the patient experienced chest pain the previous evening while lying down. The pain was described as a tightness on the right side of the chest, which did not move or worsen. The pain resolved upon waking. Accompanying the chest pain, the patient reported a nonproductive cough and shortness of breath. The patient is a smoker, consuming two to three cigarettes a day, and uses a nicotine  patch.  Patient did admit to using cocaine cocaine and marijuana on a regular basis. The patient drinks beer, but denies using it on a daily basis.     He had been seen at behavioral health initially due to reports of suicidal and homicidal ideations, but due to his symptoms EMS was called to transport to the hospital for further evaluation.  Patient had been given full dose aspirin  and 1 nitroglycerin  prior to transfer.  The patient also reported a history of thoughts of self-harm, but denied having such thoughts on the day of the consultation.   On admission into the emergency department patient was noted to be afebrile with pulse 50-68 blood pressures elevated up to 152/89, and O2 saturation maintained on room air.  Labs significant for BUN 28, creatinine 1.51, TSH/free T4 within normal limits, A1c 5.8, and high-sensitivity troponins flat at 16.  Chest x-ray noted hyperinflation with no acute abnormality.  UDS positive for cocaine and marijuana.  Patient had been given acetaminophen  650 mg  p.o. and amlodipine  10 mg p.o.    Review of Systems: As mentioned in the history of present illness. All other systems reviewed and are negative. Past Medical History:  Diagnosis Date   Arthritis 2009   likely psoriatic arthritis--inflammatory, but not symmetric, left hip and back, right elbow, both knees   Hypertension 2014   Psoriasis 2009   Right lumbar radiculopathy 2019   History reviewed. No pertinent surgical history. Social History:  reports that he has been smoking cigarettes. He has never used smokeless tobacco. He reports current alcohol use. He reports that he does not currently use drugs.  No Known Allergies  Family History  Problem Relation Age of Onset   Hypertension Mother    Arthritis Mother    Gout Mother    Hyperlipidemia Mother    Gout Father    Alcohol abuse Father        probable cirrhosis   Heart disease Brother 22       AMI   HIV/AIDS Brother    Cancer Brother        not sure what primary    Prior to Admission medications   Medication Sig Start Date End Date Taking? Authorizing Provider  amLODipine  (NORVASC ) 10 MG tablet Take 1 tablet (10 mg total) by mouth daily. 10/17/23   Horton, Charmaine FALCON, MD  aspirin  81 MG EC tablet Take 1 tablet (81 mg total) by mouth daily. Swallow whole. 11/29/21   Whitfield Raisin, NP  atorvastatin  (LIPITOR) 40 MG tablet Take 1 tablet (40 mg total) by mouth daily. 11/29/21   Whitfield Raisin, NP  cetirizine  (ZYRTEC ) 10 MG tablet Take 10 mg by mouth daily. 12/09/23   [provider]  meloxicam  (MOBIC ) 7.5 MG tablet Take 7.5 mg by mouth daily. 12/09/23   [provider]  tiZANidine (ZANAFLEX) 4 MG tablet Take 4 mg by mouth at bedtime. 12/09/23   [provider]  Vitamins A & D (VITAMIN A & D) ointment Apply 1 Application topically as needed for dry skin.    [provider]    Physical Exam: Vitals:   01/01/24 0013 01/01/24 0545 01/01/24 0855 01/01/24 0950  BP: (!) 149/89 (!) 152/89 (!) 162/99  (!) 162/99  Pulse: (!) 59 (!) 52 (!) 57   Resp: 18 18 18    Temp: 98 F (36.7 C) 98.6 F (37 C) 98.3 F (36.8 C)   TempSrc:      SpO2: 98% 100% 100%   Weight:      Height:        Constitutional: Older adult male who appears to be in no acute distress at this time Eyes: PERRL, lids and conjunctivae normal ENMT: Mucous membranes are moist. Posterior pharynx clear of any exudate or lesions.poor dentition Neck: normal, supple  Respiratory: Decreased overall aeration with expiratory phase but no significant wheezing appreciated.  O2 saturation maintained on room air. Cardiovascular: Regular rate and rhythm, no murmurs / rubs / gallops. No extremity edema. 2+ pedal pulses. No carotid bruits.  Abdomen: no tenderness, no masses palpated. Bowel sounds positive.  Musculoskeletal: no clubbing / cyanosis. No joint deformity upper and lower extremities. Good ROM, no contractures. Normal muscle tone.  Skin: no rashes, lesions, ulcers. No induration Neurologic: CN 2-12 grossly intact. Sensation intact, DTR normal. Strength 5/5 in all 4.  Psychiatric: Fair judgment and insight. Alert and oriented x 3. Normal mood.   Data Reviewed:  EKG noted bradycardia with premature ventricular complexes, inferior T wave inversions, and LVH.  Reviewed labs, imaging, and pertinent records as documented.  Assessment and Plan: Atypical chest pain Patient presents after having episode of right-sided chest pain that did not radiate and was reported to feel like pressure.  High-sensitivity troponins were negative x 2.  Chest x-ray noted hyperinflation without acute abnormality.  EKG noted inferior T wave inversions and LVH.  UDS positive for cocaine.  Cardiology plans for coronary CT. -Admit to a cardiac telemetry bed -Check echocardiogram -Follow-up coronary CT -Appreciate cardiology consultative services,  will follow-up for any further recommendations  Essential hypertension On admission blood pressures elevated  up to 162/99.  Possibly associated with recent cocaine use.  Cardiology evaluated and started irbesartan  150 mg -Continue amlodipine  and irbesartan  -Hydralazine  IV as needed for elevated blood pressures  Chronic kidney disease stage IIIa Creatinine 1.51 and BUN 28 which appears around patient baseline. -Urin -IV fluids at 75 mL/h for 1 L -Kidney function in a.m.  Schizophrenia Depression and anxiety Patient currently not on any medications for treatment.  Denies any current suicidal homicidal ideations. -Psychiatry evaluation once medically cleared possibly return back to behavioral health for further treatment.  Hyperlipidemia LDL was noted to be 89.  Home medication regimen includes atorvastatin  40 mg daily. -Increased atorvastatin  to 80 mg per cards recommendations -Recheck lipids and CMP in 2 to 3 months  Prediabetes Admission blood sugars were well-maintained.  Hemoglobin A1c was noted to be 5.8 when checked. -Continue to monitor in the outpatient setting  Polysubstance abuse Patient reports smoking about 2-3 cigarettes as well as cocaine and marijuana use. -Continue nicotine  patch -Continue to counsel patient  on need of cessation of tobacco, alcohol, and Illicit drug use  DVT prophylaxis: Lovenox .  Advance Care Planning:   Code Status: Full Code    Consults: Cardiology  Family Communication: None requested when asked  Severity of Illness: The appropriate patient status for this patient is OBSERVATION. Observation status is judged to be reasonable and necessary in order to provide the required intensity of service to ensure the patient's safety. The patient's presenting symptoms, physical exam findings, and initial radiographic and laboratory data in the context of their medical condition is felt to place them at decreased risk for further clinical deterioration. Furthermore, it is anticipated that the patient will be medically stable for discharge from the hospital within  2 midnights of admission.   Author: Maximino DELENA Sharps, MD 01/01/2024 10:22 AM  For on call review [email or web address].

## 2024-01-01 NOTE — Progress Notes (Signed)
 Patient admitted from ED.  Patient walked to bed from stretcher.  Patient alert and oriented X4.  Chest pain 0/10.  Patient arrived to our ED from [facility] with suicidal ideations.  Patient states he is not suicidal at this time and is feeling much better.  Patient educated on the importance of notifying staff if this changes.  Call bell within reach and patient provided meal.

## 2024-01-01 NOTE — Progress Notes (Signed)
 Inpatient Behavioral Health Placement  Pt meets inpatient Behavioral Health placement per [facility].  Per [facility] Cherylynn Ernst, RN pt can be accepted within [facility] system with pt is medically clear. CSW/Disposition team will assist and follow.   Mitzie GEANNIE Pinal, MSW, LCSWA 01/01/2024 1:28 AM

## 2024-01-02 ENCOUNTER — Other Ambulatory Visit: Payer: Self-pay

## 2024-01-02 ENCOUNTER — Inpatient Hospital Stay
Admission: AD | Admit: 2024-01-02 | Discharge: 2024-01-08 | DRG: 885 | Disposition: A | Discharge status: Home / Self Care | Payer: Medicaid Other | Source: Intra-hospital | Attending: Psychiatry | Admitting: Psychiatry

## 2024-01-02 ENCOUNTER — Encounter: Payer: Self-pay | Admitting: Family

## 2024-01-02 DIAGNOSIS — F1410 Cocaine abuse, uncomplicated: Secondary | ICD-10-CM | POA: Diagnosis present

## 2024-01-02 DIAGNOSIS — Z830 Family history of human immunodeficiency virus [HIV] disease: Secondary | ICD-10-CM

## 2024-01-02 DIAGNOSIS — Z555 Less than a high school diploma: Secondary | ICD-10-CM

## 2024-01-02 DIAGNOSIS — I10 Essential (primary) hypertension: Secondary | ICD-10-CM | POA: Diagnosis present

## 2024-01-02 DIAGNOSIS — Z5901 Sheltered homelessness: Secondary | ICD-10-CM

## 2024-01-02 DIAGNOSIS — F251 Schizoaffective disorder, depressive type: Secondary | ICD-10-CM | POA: Diagnosis present

## 2024-01-02 DIAGNOSIS — Z91148 Patient's other noncompliance with medication regimen for other reason: Secondary | ICD-10-CM | POA: Diagnosis not present

## 2024-01-02 DIAGNOSIS — Z8261 Family history of arthritis: Secondary | ICD-10-CM

## 2024-01-02 DIAGNOSIS — F17210 Nicotine dependence, cigarettes, uncomplicated: Secondary | ICD-10-CM | POA: Diagnosis present

## 2024-01-02 DIAGNOSIS — Z811 Family history of alcohol abuse and dependence: Secondary | ICD-10-CM

## 2024-01-02 DIAGNOSIS — F1010 Alcohol abuse, uncomplicated: Secondary | ICD-10-CM | POA: Diagnosis present

## 2024-01-02 DIAGNOSIS — J439 Emphysema, unspecified: Secondary | ICD-10-CM | POA: Diagnosis present

## 2024-01-02 DIAGNOSIS — F1210 Cannabis abuse, uncomplicated: Secondary | ICD-10-CM | POA: Diagnosis present

## 2024-01-02 DIAGNOSIS — Z5941 Food insecurity: Secondary | ICD-10-CM | POA: Diagnosis not present

## 2024-01-02 DIAGNOSIS — F419 Anxiety disorder, unspecified: Secondary | ICD-10-CM | POA: Diagnosis present

## 2024-01-02 DIAGNOSIS — Z8249 Family history of ischemic heart disease and other diseases of the circulatory system: Secondary | ICD-10-CM | POA: Diagnosis not present

## 2024-01-02 DIAGNOSIS — Z818 Family history of other mental and behavioral disorders: Secondary | ICD-10-CM

## 2024-01-02 DIAGNOSIS — Z638 Other specified problems related to primary support group: Secondary | ICD-10-CM | POA: Diagnosis not present

## 2024-01-02 DIAGNOSIS — R45851 Suicidal ideations: Secondary | ICD-10-CM | POA: Diagnosis present

## 2024-01-02 DIAGNOSIS — F209 Schizophrenia, unspecified: Secondary | ICD-10-CM | POA: Diagnosis not present

## 2024-01-02 DIAGNOSIS — Z7982 Long term (current) use of aspirin: Secondary | ICD-10-CM | POA: Diagnosis not present

## 2024-01-02 DIAGNOSIS — G4700 Insomnia, unspecified: Secondary | ICD-10-CM | POA: Diagnosis present

## 2024-01-02 DIAGNOSIS — M5416 Radiculopathy, lumbar region: Secondary | ICD-10-CM | POA: Diagnosis present

## 2024-01-02 DIAGNOSIS — Z83438 Family history of other disorder of lipoprotein metabolism and other lipidemia: Secondary | ICD-10-CM | POA: Diagnosis not present

## 2024-01-02 DIAGNOSIS — Z79899 Other long term (current) drug therapy: Secondary | ICD-10-CM | POA: Diagnosis not present

## 2024-01-02 DIAGNOSIS — E785 Hyperlipidemia, unspecified: Secondary | ICD-10-CM | POA: Diagnosis present

## 2024-01-02 DIAGNOSIS — R0789 Other chest pain: Secondary | ICD-10-CM | POA: Diagnosis not present

## 2024-01-02 LAB — URINALYSIS, ROUTINE W REFLEX MICROSCOPIC
Bilirubin Urine: NEGATIVE
Glucose, UA: NEGATIVE mg/dL
Hgb urine dipstick: NEGATIVE
Ketones, ur: NEGATIVE mg/dL
Leukocytes,Ua: NEGATIVE
Nitrite: NEGATIVE
Protein, ur: NEGATIVE mg/dL
Specific Gravity, Urine: 1.023 (ref 1.005–1.030)
pH: 6 (ref 5.0–8.0)

## 2024-01-02 LAB — BASIC METABOLIC PANEL
Anion gap: 6 (ref 5–15)
BUN: 18 mg/dL (ref 8–23)
CO2: 24 mmol/L (ref 22–32)
Calcium: 8.5 mg/dL — ABNORMAL LOW (ref 8.9–10.3)
Chloride: 108 mmol/L (ref 98–111)
Creatinine, Ser: 1.18 mg/dL (ref 0.61–1.24)
GFR, Estimated: >60 mL/min (ref >60)
Glucose, Bld: 77 mg/dL (ref 70–99)
Potassium: 4.4 mmol/L (ref 3.5–5.1)
Sodium: 138 mmol/L (ref 135–145)

## 2024-01-02 MED ORDER — LORATADINE 10 MG PO TABS
10.0000 mg | ORAL_TABLET | Freq: Every day | ORAL | Status: DC
  Administered 2024-01-03 – 2024-01-08 (×6): 10 mg via ORAL
  Filled 2024-01-02 (×6): qty 1

## 2024-01-02 MED ORDER — PANTOPRAZOLE SODIUM 40 MG PO TBEC
40.0000 mg | DELAYED_RELEASE_TABLET | Freq: Every day | ORAL | Status: DC
Start: 2024-01-02 — End: 2024-01-02
  Administered 2024-01-02: 40 mg via ORAL
  Filled 2024-01-02: qty 1

## 2024-01-02 MED ORDER — NICOTINE 14 MG/24HR TD PT24: 14.0000 mg | MEDICATED_PATCH | Freq: Every day | TRANSDERMAL | 0 refills | Status: DC

## 2024-01-02 MED ORDER — ALUM & MAG HYDROXIDE-SIMETH 200-200-20 MG/5ML PO SUSP: 30.0000 mL | ORAL | Status: DC | PRN

## 2024-01-02 MED ORDER — IRBESARTAN 150 MG PO TABS
150.0000 mg | ORAL_TABLET | Freq: Every day | ORAL | Status: DC
  Administered 2024-01-03 – 2024-01-08 (×6): 150 mg via ORAL
  Filled 2024-01-02 (×6): qty 1

## 2024-01-02 MED ORDER — ATORVASTATIN CALCIUM 80 MG PO TABS: 80.0000 mg | ORAL_TABLET | Freq: Every day | ORAL | 0 refills | Status: DC

## 2024-01-02 MED ORDER — MAGNESIUM HYDROXIDE 400 MG/5ML PO SUSP: 30.0000 mL | Freq: Every day | ORAL | Status: DC | PRN

## 2024-01-02 MED ORDER — AMLODIPINE BESYLATE 5 MG PO TABS
10.0000 mg | ORAL_TABLET | Freq: Every day | ORAL | Status: DC
  Administered 2024-01-03 – 2024-01-08 (×6): 10 mg via ORAL
  Filled 2024-01-02 (×6): qty 2

## 2024-01-02 MED ORDER — PANTOPRAZOLE SODIUM 40 MG PO TBEC
40.0000 mg | DELAYED_RELEASE_TABLET | Freq: Every day | ORAL | Status: DC
  Administered 2024-01-03 – 2024-01-08 (×6): 40 mg via ORAL
  Filled 2024-01-02 (×6): qty 1

## 2024-01-02 MED ORDER — ASPIRIN 81 MG PO TBEC
81.0000 mg | DELAYED_RELEASE_TABLET | Freq: Every day | ORAL | Status: DC
Start: 2024-01-03 — End: 2024-01-08
  Administered 2024-01-03 – 2024-01-08 (×6): 81 mg via ORAL
  Filled 2024-01-02 (×6): qty 1

## 2024-01-02 MED ORDER — NICOTINE 14 MG/24HR TD PT24
14.0000 mg | MEDICATED_PATCH | Freq: Every day | TRANSDERMAL | Status: DC
  Administered 2024-01-03 – 2024-01-08 (×6): 14 mg via TRANSDERMAL
  Filled 2024-01-02 (×6): qty 1

## 2024-01-02 MED ORDER — HYDRALAZINE HCL 10 MG PO TABS
10.0000 mg | ORAL_TABLET | Freq: Four times a day (QID) | ORAL | Status: AC | PRN
  Administered 2024-01-03: 10 mg via ORAL
  Filled 2024-01-02 (×2): qty 1

## 2024-01-02 MED ORDER — PANTOPRAZOLE SODIUM 40 MG PO TBEC: 40.0000 mg | DELAYED_RELEASE_TABLET | Freq: Every day | ORAL | 0 refills | Status: DC

## 2024-01-02 MED ORDER — OLANZAPINE 10 MG IM SOLR: 5.0000 mg | Freq: Three times a day (TID) | INTRAMUSCULAR | Status: DC | PRN

## 2024-01-02 MED ORDER — IRBESARTAN 150 MG PO TABS: 150.0000 mg | ORAL_TABLET | Freq: Every day | ORAL | 0 refills | Status: DC

## 2024-01-02 MED ORDER — ACETAMINOPHEN 325 MG PO TABS
650.0000 mg | ORAL_TABLET | Freq: Four times a day (QID) | ORAL | Status: DC | PRN
  Administered 2024-01-05: 650 mg via ORAL
  Filled 2024-01-02: qty 2

## 2024-01-02 MED ORDER — ATORVASTATIN CALCIUM 20 MG PO TABS
80.0000 mg | ORAL_TABLET | Freq: Every day | ORAL | Status: DC
  Administered 2024-01-02 – 2024-01-07 (×6): 80 mg via ORAL
  Filled 2024-01-02 (×6): qty 4

## 2024-01-02 MED ORDER — OLANZAPINE 5 MG PO TBDP: 5.0000 mg | ORAL_TABLET | Freq: Three times a day (TID) | ORAL | Status: DC | PRN
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
## 2024-01-02 NOTE — Progress Notes (Signed)
   01/02/24 1705  Spiritual Encounters  Type of Visit Initial;Attempt (pt unavailable)  Referral source Patient request  OnCall Visit Yes   Chaplain attempted to visit patient who was sleeping at the time. Chaplain team will follow-up.

## 2024-01-02 NOTE — Progress Notes (Addendum)
   Patient Name: Randy Collins Date of Encounter: 01/02/2024 [facility] Cardiologist: None   Interval Summary  .    Feeling well. Denies chest pain or shortness of breath.   Vital Signs .    Vitals:   01/01/24 2339 01/02/24 0444 01/02/24 0740 01/02/24 0841  BP: 123/81 (!) 169/98 (!) 159/102 (!) 148/92  Pulse: (!) 53 (!) 57  60  Resp: 18 18 19 12   Temp: (!) 97.4 F (36.3 C) 97.9 F (36.6 C) 97.6 F (36.4 C)   TempSrc: Oral Oral Oral   SpO2: 93% 92%    Weight:      Height:        Intake/Output Summary (Last 24 hours) at 01/02/2024 1020 Last data filed at 01/02/2024 0843 Gross per 24 hour  Intake --  Output 1150 ml  Net -1150 ml      01/01/2024    7:49 PM 12/31/2023    5:15 PM 10/17/2023    4:07 AM  Last 3 Weights  Weight (lbs) 148 lb 3.2 oz 150 lb 160 lb  Weight (kg) 67.223 kg 68.04 kg 72.576 kg      Telemetry/ECG    Sinus rhythm.  PVCs.- Personally Reviewed  Physical Exam .   GEN: No acute distress.   Neck: No JVD Cardiac: RRR, no murmurs, rubs, or gallops.  Respiratory: Clear to auscultation bilaterally. GI: Soft, nontender, non-distended  MS: No edema  Assessment & Plan .     Randy Collins is a 61M with schizophrenia, bipolar disorder, PTSD, hypertension, polysubstance abuse (cocaine and tobacco) and hypertension admitted with atypical chest pain.   # Atypical chest pain: # Hyperlipidemia: Symptoms are very atypical.  Occurred when lying down and not with exertion.  It was also right-sided and not left-sided.  Coronary CT revealed nonobstructive coronary disease.  Currently chest pain-free.  Cardiac enzymes are negative.  Lipid panel revealed an LDL of 89.  Recommended increasing atorvastatin  to 80 mg.  His LDL goal should be less than 70.  Repeat lipids and a CMP in 2 to 3 months.  Continue aspirin .  Advised cessation of cocaine.   # Hypertension:  Blood pressure is very poorly controlled.  At home he is on amlodipine  10 mg daily.  He reports his  blood pressures always been uncontrolled.  He notes that no medications work for him but if he drinks a lot of water his blood pressure goes down.  He reports that it is common for his blood pressures to be in the 200s at home.  It appears that this is also causing worsening renal function.  Recommend more aggressive control of his blood pressure.  Added irbestartan.  BMP pending.   # Tobacco abuse:  # Cocaine use:  Patch applied.  Continue to encourage cessation.   # GERD:  Chest pain occurred when lying down.  He notes that he did have something spicy to eat the night before and has had issues with GERD in the past.  Will add Protonix  40 mg daily for 30 days.  [facility] will sign off.   Medication Recommendations:  continue irbesartan  Other recommendations (labs, testing, etc):  BMP today and in 1-2 weeks. Lipids/CMP in 2-3 months.  Follow up as an outpatient:  we will arrange    For questions or updates, please contact [facility] Please consult [email or web address] for contact info under        Signed, Annabella Scarce, MD

## 2024-01-02 NOTE — Hospital Course (Addendum)
 Mr. Muzammil Bruins is a 63 year old male with history of hyperlipidemia, hypertension poorly controlled, tobacco use disorder, GERD, polysubstance abuse including cocaine, chronic back pain with lumbar radiculopathy, schizophrenia, depression, who presents ED from [facility] on 01/01/2024 for chief concerns of chest pain.  Patient was admitted under hospitalist service at [facility] for cardiac rule out.  CT cardiac study was done: And was read as no significant extracardiac vascular findings.  Emphysema.  EKG and troponins were negative and suspect to be GI in origin complicated by cocaine use.  Patient was discharged from [facility] hospitalist service and transferred to behavioral health for continuation behavioral health therapy.  On 01/02/2024, tried hospitalist was consulted for hypertension, uncontrolled: 169/98.

## 2024-01-02 NOTE — TOC Initial Note (Addendum)
 Transition of Care [facility]) - Initial/Assessment Note    Patient Details  Name: Randy Collins MRN: [id number] Date of Birth: 18-Sep-1961  [facility]) CM/SW Contact:    Isaiah Public, LCSWA Phone Number: 01/02/2024, 11:40 AM  Clinical Narrative:                   CSW spoke with patient at bedside. Patient reports PTA he was at [facility]. Patient  voluntary to go to [facility]. Patient signed [facility] form. [facility] form faxed over to [facility] with [facility]. CSW will continue to follow.  Expected Discharge Plan:  [facility]) Barriers to Discharge: No Barriers Identified   Patient Goals and CMS Choice Patient states their goals for this hospitalization and ongoing recovery are:: [facility]   Choice offered to / list presented to : Patient      Expected Discharge Plan and Services In-house Referral: Clinical Social Work                                            Prior Living Arrangements/Services     Patient language and need for interpreter reviewed:: Yes Do you feel safe going back to the place where you live?: No   [facility]  Need for Family Participation in Patient Care: Yes (Comment) Care giver support system in place?: Yes (comment)   Criminal Activity/Legal Involvement Pertinent to Current Situation/Hospitalization: No - Comment as needed  Activities of Daily Living   ADL Screening (condition at time of admission) Independently performs ADLs?: Yes (appropriate for developmental age) Is the patient deaf or have difficulty hearing?: No Does the patient have difficulty seeing, even when wearing glasses/contacts?: No Does the patient have difficulty concentrating, remembering, or making decisions?: No  Permission Sought/Granted Permission sought to share information with : Case Manager, Magazine Features Editor, Family Supports                Emotional Assessment Appearance:: Appears stated age Attitude/Demeanor/Rapport: Gracious Affect  (typically observed): Calm Orientation: : Oriented to Self, Oriented to Place, Oriented to  Time, Oriented to Situation Alcohol / Substance Use: Not Applicable Psych Involvement: Yes (comment)  Admission diagnosis:  Cocaine abuse (HCC) [F14.10] Chest pain [R07.9] Nonspecific chest pain [R07.9] Patient Active Problem List   Diagnosis Date Noted   Atypical chest pain 01/01/2024   Pure hypercholesterolemia 01/01/2024   CKD (chronic kidney disease), stage III (HCC) 01/01/2024   Schizophrenia (HCC) 01/01/2024   Anxiety and depression 01/01/2024   Prediabetes 01/01/2024   Polysubstance abuse (HCC) 01/01/2024   Needs assistance with community resources 05/13/2023   Adjustment disorder with anxious mood 05/13/2023   Encounter for medication management 05/13/2023   Insomnia 05/13/2023   Spinal stenosis of lumbar region 09/05/2022   Protrusion of lumbar intervertebral disc 03/19/2022   Hypertensive urgency 10/20/2021   Tobacco abuse 10/20/2021   Right arm numbness 10/20/2021   Elevated troponin 10/20/2021   Other bipolar disorder (HCC) 10/20/2021   Psoriasis    Right lumbar radiculopathy 12/24/2017   Essential hypertension 12/24/2012   Arthritis 12/25/2007   PCP:  Shelda Atlas, MD Pharmacy:   [id number] - [place] - [address] [place] ([place]) [place] [address] Phone: [phone] Fax: [phone]  [facility] - [place], [place] - [address] [place] [address] Phone:  [phone] Fax: [phone]  [id number] - [place], [place] - [address] AT [facility] [address] [place] [address] Phone: [phone] Fax: [phone]     Social Drivers of Health (SDOH) Social History: SDOH Screenings   Food Insecurity: Food Insecurity Present (01/01/2024)  Housing: Patient Declined (01/01/2024)  Transportation Needs: No  Transportation Needs (01/01/2024)  Utilities: At Risk (01/01/2024)  Depression (PHQ2-9): Medium Risk (10/25/2021)  Social Connections: Socially Isolated (01/01/2024)  Stress: Stress Concern Present (10/31/2018)  Tobacco Use: High Risk (12/31/2023)   SDOH Interventions:     Readmission Risk Interventions     No data to display

## 2024-01-02 NOTE — Discharge Summary (Signed)
 Physician Discharge Summary  Randy Collins FMW:[id number] DOB: Apr 29, 1961 DOA: 12/31/2023  PCP: Shelda Atlas, MD  Admit date: 12/31/2023 Discharge date: 01/02/2024  Admitted From: Behavioral health Disposition:  Same  Recommendations for Outpatient Follow-up:  Follow up with PCP in 1-2 weeks Follow up with psych team as scheduled:  Discharge Condition: Stable  CODE STATUS:Full  Diet recommendation: Low fat low salt diet    Brief/Interim Summary: Randy Collins is a 63 y.o. male with medical history significant of hypertension, schizophrenia, depression, polysubstance abuse(cocaine, marijuana, and tobacco) who presented with complaints of atypical chest pain. He had been seen at behavioral health initially due to reports of suicidal and homicidal ideations, but due to his symptoms EMS was called to transport to the hospital for further evaluation.   Patient evaluated by cardiology - given unremarkable CT calcium  score, EKG and negative troponin there was discussion about GI origin of pain. Continue new regimen for HTN urgency and CAD risk as below - otherwise stable and agreeable for discharge back to behavioral health.   Discharge Diagnoses:  Principal Problem:   Schizophrenia (HCC) Active Problems:   Atypical chest pain   Essential hypertension   CKD (chronic kidney disease), stage III (HCC)   Anxiety and depression   Pure hypercholesterolemia   Prediabetes   Polysubstance abuse (HCC)   Atypical chest pain, ACS ruled out Hypertensive urgency -Exacerbated by cocaine use and profound hypertension -DC amlodipine  and continue irbesartan    Chronic kidney disease stage IIIa -At baseline   Schizophrenia Depression and anxiety -Patient currently not on any medications for treatment.  Denies any current suicidal homicidal ideations. -Psychiatry evaluation once medically cleared to return back to behavioral health for further treatment.   Hyperlipidemia LDL was noted to be  89.  Home medication regimen includes atorvastatin  40 mg daily. -Increased atorvastatin  to 80 mg per cards recommendations -Recheck lipids and CMP in 2 to 3 months   Prediabetes Admission blood sugars were well-maintained.  Hemoglobin A1c 5.8 -Continue to monitor in the outpatient setting   Polysubstance abuse Patient reports smoking about 2-3 cigarettes as well as cocaine and marijuana use. -Continue nicotine  patch -Continue to counsel patient on need of cessation of tobacco, alcohol, and Illicit drug use     Discharge Instructions   Allergies as of 01/02/2024   No Known Allergies      Medication List     STOP taking these medications    amLODipine  10 MG tablet Commonly known as: NORVASC    meloxicam  7.5 MG tablet Commonly known as: MOBIC    tiZANidine 4 MG tablet Commonly known as: ZANAFLEX       TAKE these medications    aspirin  EC 81 MG tablet Take 1 tablet (81 mg total) by mouth daily. Swallow whole.   atorvastatin  80 MG tablet Commonly known as: LIPITOR Take 1 tablet (80 mg total) by mouth at bedtime. What changed:  medication strength how much to take when to take this   cetirizine  10 MG tablet Commonly known as: ZYRTEC  Take 10 mg by mouth daily.   irbesartan  150 MG tablet Commonly known as: AVAPRO  Take 1 tablet (150 mg total) by mouth daily. Start taking on: January 03, 2024   nicotine  14 mg/24hr patch Commonly known as: NICODERM CQ  - dosed in mg/24 hours Place 1 patch (14 mg total) onto the skin daily. Start taking on: January 03, 2024   pantoprazole  40 MG tablet Commonly known as: PROTONIX  Take 1 tablet (40 mg total) by mouth daily.  vitamin A & D ointment Apply 1 Application topically as needed for dry skin.        Follow-up Information     Vannie Reche RAMAN, NP Follow up on 02/03/2024.   Specialty: Cardiology Why: at 8:25am for your cardiology follow up appointment Contact information: [address] [place]  [address] [phone]                No Known Allergies  Consultations: Cardiology  Procedures/Studies: CT CORONARY MORPH W/CTA COR W/SCORE W/CA W/CM &/OR WO/CM Addendum Date: 01/01/2024 ADDENDUM REPORT: 01/01/2024 18:12 EXAM: OVER-READ INTERPRETATION  CT CHEST The following report is an over-read performed by radiologist Dr. Oneil Devonshire of [facility] on 01/01/2024. This over-read does not include interpretation of cardiac or coronary anatomy or pathology. The coronary calcium  score/coronary CTA interpretation by the cardiologist is attached. COMPARISON:  None. FINDINGS: Cardiovascular: There are no significant extracardiac vascular findings. Mediastinum/Nodes: There are no enlarged lymph nodes within the visualized mediastinum. Lungs/Pleura: There is no pleural effusion. The visualized lungs appear clear. Mild emphysematous changes are noted. Upper abdomen: No significant findings in the visualized upper abdomen. Musculoskeletal/Chest wall: No chest wall mass or suspicious osseous findings within the visualized chest. IMPRESSION: Emphysema (ICD10-J43.9). Electronically Signed   By: Oneil Devonshire M.D.   On: 01/01/2024 18:12   Result Date: 01/01/2024 CLINICAL DATA:  Chest pain EXAM: Cardiac/Coronary CTA TECHNIQUE: A non-contrast, gated CT scan was obtained with axial slices of 3 mm through the heart for calcium  scoring. Calcium  scoring was performed using the Agatston method. A 120 kV prospective, gated, contrast cardiac scan was obtained. Gantry rotation speed was 250 msecs and collimation was 0.6 mm. Two sublingual nitroglycerin  tablets (0.8 mg) were given. The 3D data set was reconstructed in 5% intervals of the 35-75% of the R-R cycle. Diastolic phases were analyzed on a dedicated workstation using MPR, MIP, and VRT modes. The patient received 95 cc of contrast. FINDINGS: Image quality: Excellent. Noise artifact is: Limited. Coronary Arteries:  Normal coronary origin.  Left dominance.  Left main: The left main is a large caliber vessel with a normal take off from the left coronary cusp that trifurcates into a LAD, LCX, and ramus intermedius. There is no plaque or stenosis. Left anterior descending artery: The LAD is patent without evidence of plaque or stenosis. The LAD gives off 2 patent diagonal branches. Ramus intermedius: Patent with no evidence of plaque or stenosis. Left circumflex artery: The LCX is dominant and patent with no evidence of plaque or stenosis. The LCX gives off 2 patent obtuse marginal branches and terminates in a LPDA and LPLA. There is moderate soft plaque in the proximal to mid LCx with associated stenosis of 50-69%. Right coronary artery: The RCA is non-dominant with normal take off from the right coronary cusp. There is no evidence of plaque or stenosis. Right Atrium: Right atrial size is within normal limits. Right Ventricle: The right ventricular cavity is within normal limits. Left Atrium: Left atrial size is normal in size with no left atrial appendage filling defect. Left Ventricle: The ventricular cavity size is within normal limits. Pulmonary arteries: Normal in size. Pulmonary veins: Normal pulmonary venous drainage. Pericardium: Normal thickness without significant effusion or calcium  present. Cardiac valves: The aortic valve is trileaflet without significant calcification. The mitral valve is normal without significant calcification. Aorta: Normal caliber without significant disease. Extra-cardiac findings: See attached radiology report for non-cardiac structures. IMPRESSION: 1. Coronary calcium  score of 0. This was 0 percentile for  age-, sex, and race-matched controls. 2. Total plaque volume 12 mm3 which is 7th percentile for age- and sex-matched controls (calcified plaque 66mm3; non-calcified plaque 46mm3). TPV is mild. 3. Normal coronary origin with left dominance. 4. Moderate atherosclerosis: 50-69% proximal to mid LCx. 5. Recommend preventive therapy and  risk factor modification. 6. This study has been submitted for FFR analysis. RECOMMENDATIONS: 1. CAD-RADS 0: No evidence of CAD (0%). Consider non-atherosclerotic causes of chest pain. 2. CAD-RADS 1: Minimal non-obstructive CAD (0-24%). Consider non-atherosclerotic causes of chest pain. Consider preventive therapy and risk factor modification. 3. CAD-RADS 2: Mild non-obstructive CAD (25-49%). Consider non-atherosclerotic causes of chest pain. Consider preventive therapy and risk factor modification. 4. CAD-RADS 3: Moderate stenosis. Consider symptom-guided anti-ischemic pharmacotherapy as well as risk factor modification per guideline directed care. Additional analysis with CT FFR will be submitted. 5. CAD-RADS 4: Severe stenosis. (70-99% or > 50% left main). Cardiac catheterization or CT FFR is recommended. Consider symptom-guided anti-ischemic pharmacotherapy as well as risk factor modification per guideline directed care. Invasive coronary angiography recommended with revascularization per published guideline statements. 6. CAD-RADS 5: Total coronary occlusion (100%). Consider cardiac catheterization or viability assessment. Consider symptom-guided anti-ischemic pharmacotherapy as well as risk factor modification per guideline directed care. 7. CAD-RADS N: Non-diagnostic study. Obstructive CAD can't be excluded. Alternative evaluation is recommended. Wilbert Bihari, MD Electronically Signed: By: Wilbert Bihari M.D. On: 01/01/2024 17:38   CT CORONARY FRACTIONAL FLOW RESERVE FLUID ANALYSIS Result Date: 01/01/2024 EXAM: FFRCT ANALYSIS FINDINGS: FFRct analysis was performed on the original cardiac CT angiogram dataset. Diagrammatic representation of the FFRct analysis is provided in a separate PDF document in PACS. This dictation was created using the PDF document and an interactive 3D model of the results. 3D model is not available in the EMR/PACS. Normal FFR range is >0.80. 1. Left Main: No stenosis. FFR = 1.00. 2.  LAD: No significant stenosis. Proximal FFR = 0.96, Mid FFR = 0.90, Distal FFR = 0.82 3. LCX: No significant stenosis. Proximal FFR = 0.99, Mid FFR = 0.91, Distal FFR =0.86. 4. Ramus: No modeled. 5. RCA: No stenosis. Proximal FFR = 0.99, Mid FFR = 0.94. IMPRESSION: 1. Coronary CTA FFR demonstrates no hemodynamically flow limiting lesions. Wilbert Bihari, MD Electronically Signed   By: Wilbert Bihari M.D.   On: 01/01/2024 17:52   ECHOCARDIOGRAM COMPLETE Result Date: 01/01/2024    ECHOCARDIOGRAM REPORT   Patient Name:   Randy Collins [facility] Date of Exam: 01/01/2024 Medical Rec #:  [id number]        Height:       70.0 in Accession #:    [id number]       Weight:       150.0 lb Date of Birth:  Nov 01, 1961         BSA:          1.847 m Patient Age:    62 years         BP:           139/79 mmHg Patient Gender: M                HR:           51 bpm. Exam Location:  Inpatient Procedure: 2D Echo, 3D Echo, Color Doppler, Cardiac Doppler and Strain Analysis Indications:    R07.89 Other chest pain  History:        Patient has prior history of Echocardiogram examinations, most  recent 10/21/2021. Risk Factors:Hypertension.  Sonographer:    Lanell Maduro Referring Phys: [id number] RONDELL A SMITH IMPRESSIONS  1. Left ventricular ejection fraction, by estimation, is 60 to 65%. The left ventricle has normal function. The left ventricle has no regional wall motion abnormalities. There is mild left ventricular hypertrophy. Left ventricular diastolic parameters are indeterminate. The average left ventricular global longitudinal strain is -18.0 %. The global longitudinal strain is normal.  2. Right ventricular systolic function is normal. The right ventricular size is normal. There is normal pulmonary artery systolic pressure. The estimated right ventricular systolic pressure is 23.1 mmHg.  3. The mitral valve is normal in structure. Mild mitral valve regurgitation.  4. The aortic valve is tricuspid. Aortic valve regurgitation is  not visualized.  5. The inferior vena cava is normal in size with <50% respiratory variability, suggesting right atrial pressure of 8 mmHg. FINDINGS  Left Ventricle: Left ventricular ejection fraction, by estimation, is 60 to 65%. The left ventricle has normal function. The left ventricle has no regional wall motion abnormalities. The average left ventricular global longitudinal strain is -18.0 %. The global longitudinal strain is normal. The left ventricular internal cavity size was normal in size. There is mild left ventricular hypertrophy. Left ventricular diastolic parameters are indeterminate. Right Ventricle: The right ventricular size is normal. Right ventricular systolic function is normal. There is normal pulmonary artery systolic pressure. The tricuspid regurgitant velocity is 1.94 m/s, and with an assumed right atrial pressure of 8 mmHg,  the estimated right ventricular systolic pressure is 23.1 mmHg. Left Atrium: Left atrial size was normal in size. Right Atrium: Right atrial size was normal in size. Pericardium: There is no evidence of pericardial effusion. Mitral Valve: The mitral valve is normal in structure. Mild mitral valve regurgitation. Tricuspid Valve: Tricuspid valve regurgitation is trivial. Aortic Valve: The aortic valve is tricuspid. Aortic valve regurgitation is not visualized. Pulmonic Valve: The pulmonic valve was not well visualized. Aorta: The aortic root is normal in size and structure. Venous: The inferior vena cava is normal in size with less than 50% respiratory variability, suggesting right atrial pressure of 8 mmHg. IAS/Shunts: No atrial level shunt detected by color flow Doppler.  LEFT VENTRICLE PLAX 2D LVIDd:         4.60 cm     Diastology LVIDs:         2.35 cm     LV e' medial:    7.18 cm/s LV PW:         1.00 cm     LV E/e' medial:  12.2 LV IVS:        1.10 cm     LV e' lateral:   9.36 cm/s LVOT diam:     1.70 cm     LV E/e' lateral: 9.4 LV SV:         51 LV SV Index:   28           2D Longitudinal Strain LVOT Area:     2.27 cm    2D Strain GLS Avg:     -18.0 %  LV Volumes (MOD) LV vol d, MOD A2C: 93.2 ml 3D Volume EF: LV vol d, MOD A4C: 75.0 ml 3D EF:        62 % LV vol s, MOD A2C: 21.2 ml LV EDV:       178 ml LV vol s, MOD A4C: 27.6 ml LV ESV:       67 ml LV SV MOD A2C:  72.0 ml LV SV:        111 ml LV SV MOD A4C:     75.0 ml LV SV MOD BP:      60.0 ml RIGHT VENTRICLE            IVC RV S prime:     9.14 cm/s  IVC diam: 1.80 cm TAPSE (M-mode): 1.8 cm LEFT ATRIUM             Index        RIGHT ATRIUM           Index LA Vol (A2C):   42.4 ml 22.95 ml/m  RA Area:     14.70 cm LA Vol (A4C):   43.5 ml 23.55 ml/m  RA Volume:   36.30 ml  19.65 ml/m LA Biplane Vol: 42.9 ml 23.23 ml/m  AORTIC VALVE LVOT Vmax:   133.00 cm/s LVOT Vmean:  84.000 cm/s LVOT VTI:    0.226 m  AORTA Ao Root diam: 3.80 cm MITRAL VALVE               TRICUSPID VALVE MV Area (PHT): 4.06 cm    TR Peak grad:   15.1 mmHg MV Decel Time: 187 msec    TR Vmax:        194.00 cm/s MV E velocity: 87.80 cm/s MV A velocity: 56.30 cm/s  SHUNTS MV E/A ratio:  1.56        Systemic VTI:  0.23 m                            Systemic Diam: 1.70 cm Ronal Ross Electronically signed by Ronal Ross Signature Date/Time: 01/01/2024/5:00:53 PM    Final    DG Chest 2 View Result Date: 12/31/2023 CLINICAL DATA:  Chest pain EXAM: CHEST - 2 VIEW COMPARISON:  X-ray 10/17/2023 and older FINDINGS: Hyperinflation with some chronic lung changes. No consolidation, pneumothorax or effusion. No edema. Normal cardiopericardial silhouette. Calcified aorta. Degenerative changes along the spine. IMPRESSION: Hyperinflation with chronic changes. No acute cardiopulmonary disease. Electronically Signed   By: Ranell Bring M.D.   On: 12/31/2023 17:51     Subjective: No acute issues or events overnight   Discharge Exam: Vitals:   01/02/24 0740 01/02/24 0841  BP: (!) 159/102 (!) 148/92  Pulse:  60  Resp: 19 12  Temp: 97.6 F (36.4 C)   SpO2:      Vitals:   01/01/24 2339 01/02/24 0444 01/02/24 0740 01/02/24 0841  BP: 123/81 (!) 169/98 (!) 159/102 (!) 148/92  Pulse: (!) 53 (!) 57  60  Resp: 18 18 19 12   Temp: (!) 97.4 F (36.3 C) 97.9 F (36.6 C) 97.6 F (36.4 C)   TempSrc: Oral Oral Oral   SpO2: 93% 92%    Weight:      Height:        General: Pt is alert, awake, not in acute distress Cardiovascular: RRR, S1/S2 +, no rubs, no gallops Respiratory: CTA bilaterally, no wheezing, no rhonchi Abdominal: Soft, NT, ND, bowel sounds + Extremities: no edema, no cyanosis    The results of significant diagnostics from this hospitalization (including imaging, microbiology, ancillary and laboratory) are listed below for reference.     Microbiology: No results found for this or any previous visit (from the past 240 hours).   Labs: BNP (last 3 results) No results for input(s): BNP in the last 8760 hours. Basic Metabolic Panel: Recent Labs  Lab 12/31/23 1450  NA 141  K 4.3  CL 105  CO2 27  GLUCOSE 94  BUN 28*  CREATININE 1.51*  CALCIUM  9.0  MG 2.0   Liver Function Tests: Recent Labs  Lab 12/31/23 1450  AST 16  ALT 12  ALKPHOS 57  BILITOT 0.5  PROT 6.8  ALBUMIN 3.5   No results for input(s): LIPASE, AMYLASE in the last 168 hours. No results for input(s): AMMONIA in the last 168 hours. CBC: Recent Labs  Lab 12/31/23 1450  WBC 6.4  NEUTROABS 4.5  HGB 15.4  HCT 47.4  MCV 92.0  PLT 306   Cardiac Enzymes: No results for input(s): CKTOTAL, CKMB, CKMBINDEX, TROPONINI in the last 168 hours. BNP: Invalid input(s): POCBNP CBG: No results for input(s): GLUCAP in the last 168 hours. D-Dimer Recent Labs    01/01/24 2023  DDIMER <0.27   Hgb A1c Recent Labs    12/31/23 1450  HGBA1C 5.8*   Lipid Profile Recent Labs    12/31/23 1450  CHOL 174  HDL 73  LDLCALC 89  TRIG 60  CHOLHDL 2.4   Thyroid  function studies Recent Labs    12/31/23 1450  TSH 0.767  T3FREE 2.4    Anemia work up Recent Labs    12/31/23 1450  VITAMINB12 404   Urinalysis    Component Value Date/Time   COLORURINE YELLOW 01/02/2024 0450   APPEARANCEUR CLEAR 01/02/2024 0450   LABSPEC 1.023 01/02/2024 0450   PHURINE 6.0 01/02/2024 0450   GLUCOSEU NEGATIVE 01/02/2024 0450   HGBUR NEGATIVE 01/02/2024 0450   BILIRUBINUR NEGATIVE 01/02/2024 0450   KETONESUR NEGATIVE 01/02/2024 0450   PROTEINUR NEGATIVE 01/02/2024 0450   NITRITE NEGATIVE 01/02/2024 0450   LEUKOCYTESUR NEGATIVE 01/02/2024 0450   Sepsis Labs Recent Labs  Lab 12/31/23 1450  WBC 6.4   Microbiology No results found for this or any previous visit (from the past 240 hours).   Time coordinating discharge: Over 30 minutes  SIGNED:   Elsie JAYSON [place], DO [facility] 01/02/2024, 1:19 PM Pager   If 7PM-7AM, please contact night-coverage [email or web address]

## 2024-01-02 NOTE — Consult Note (Signed)
 Initial Consultation Note   Patient: Randy Collins FMW:[id number] DOB: September 19, 1961 PCP: Shelda Atlas, MD DOA: 01/02/2024 DOS: the patient was seen and examined on 01/02/2024 Primary service: Victoria Ruts, MD  Referring Provider: Nicholaus Cambric, NP Reason for consult: Uncontrolled hypertension  I have personally briefly reviewed patient's old medical records in [facility] EMR.  HPI: [name] is a 63 year old male with history of hyperlipidemia, hypertension poorly controlled, tobacco use disorder, GERD, polysubstance abuse including cocaine, chronic back pain with lumbar radiculopathy, schizophrenia, depression, who presents ED from [facility] on 01/01/2024 for chief concerns of chest pain.  Patient was admitted under hospitalist service at [facility] for cardiac rule out.  CT cardiac study was done: And was read as no significant extracardiac vascular findings.  Emphysema.  EKG and troponins were negative and suspect to be GI in origin complicated by cocaine use.  Patient was discharged from [facility] and transferred to behavioral health for continuation behavioral health therapy.  On 01/02/2024, tried hospitalist was consulted for hypertension, uncontrolled: 169/98. ------------------------------------ At bedside patient was able to tell me his name, age, location, current calendar year.  He denies shortness of breath, chest pain, nausea, vomiting, diarrhea, dysuria.  He states he feels depressed.  He did not have complaints.  ROS: Constitutional: no weight change, no fever ENT/Mouth: no sore throat, no rhinorrhea Eyes: no eye pain, no vision changes Cardiovascular: no chest pain, no dyspnea,  no edema, no palpitations Respiratory: no cough, no sputum, no wheezing Gastrointestinal: no nausea, no vomiting, no diarrhea, no constipation Genitourinary: no urinary incontinence, no dysuria, no hematuria Musculoskeletal: no arthralgias, no myalgias Skin: no  skin lesions, no pruritus, Neuro: no weakness, no loss of consciousness, no syncope Psych: no anxiety, no depression, no decrease appetite Heme/Lymph: no bruising, no bleeding  Assessment/Plan  Principal Problem:   Schizoaffective disorder, depressive type (HCC) Active Problems:   Essential hypertension   Assessment and Plan: * Schizoaffective disorder, depressive type (HCC) Per primary team  Essential hypertension Uncontrolled Several blood pressure reading including 05/13/2023: 191/110 05/15/2023: 177/94 12/31/2023: 150/98 01/01/2024: Several blood pressure reading from 149-162/89-99 Per cardiology note on 01/02/2024: Resume home amlodipine  10 mg daily Amlodipine  resume for 01/02/2023 Add: Hydralazine  10 mg p.o. every 6 hours as needed for SBP greater 175, 5 days ordered Given patient's long history of uncontrolled blood pressure and medication noncompliance, avoid tight blood pressure control  Thank you for consulting [facility] in the care of Randy Collins. [facility] hospitalist will sign off at this time. Please reconsult should you have further need.    Chart reviewed.   Diet: Regular diet was discontinued.  Heart healthy diet ordered as patient has high blood pressure Disposition Plan: Per primary team  Past Medical History:  Diagnosis Date   Arthritis 2009   likely psoriatic arthritis--inflammatory, but not symmetric, left hip and back, right elbow, both knees   Hypertension 2014   Psoriasis 2009   Right lumbar radiculopathy 2019   History reviewed. No pertinent surgical history.  Social History:  reports that he has been smoking cigarettes. He has never used smokeless tobacco. He reports current alcohol use. He reports current drug use. Drugs: Crack cocaine and Marijuana.  No Known Allergies Family History  Problem Relation Age of Onset   Hypertension Mother    Arthritis Mother    Gout Mother    Hyperlipidemia Mother    Gout Father    Alcohol abuse Father  probable cirrhosis   Heart disease Brother 68       AMI   HIV/AIDS Brother    Cancer Brother        not sure what primary   Family history: Family history reviewed and not pertinent.  Prior to Admission medications   Medication Sig Start Date End Date Taking? Authorizing Provider  aspirin  81 MG EC tablet Take 1 tablet (81 mg total) by mouth daily. Swallow whole. 11/29/21   Whitfield Raisin, NP  atorvastatin  (LIPITOR) 80 MG tablet Take 1 tablet (80 mg total) by mouth at bedtime. 01/02/24   Lue Elsie BROCKS, MD  cetirizine  (ZYRTEC ) 10 MG tablet Take 10 mg by mouth daily. 12/09/23   [provider]  irbesartan  (AVAPRO ) 150 MG tablet Take 1 tablet (150 mg total) by mouth daily. 01/03/24   Lue Elsie BROCKS, MD  nicotine  (NICODERM CQ  - DOSED IN MG/24 HOURS) 14 mg/24hr patch Place 1 patch (14 mg total) onto the skin daily. 01/03/24   Lue Elsie BROCKS, MD  pantoprazole  (PROTONIX ) 40 MG tablet Take 1 tablet (40 mg total) by mouth daily. 01/02/24   Lue Elsie BROCKS, MD  Vitamins A & D (VITAMIN A & D) ointment Apply 1 Application topically as needed for dry skin.    [provider]   Physical Exam: Vitals:   01/02/24 1549 01/02/24 1618  BP: (!) 169/98 (!) 169/98  Pulse: 60 (!) 116  Resp: 20 20  Temp: 97.8 F (36.6 C) 97.8 F (36.6 C)  TempSrc:  Oral  SpO2: 100% 100%  Weight:  67.1 kg  Height:  5' 10 (1.778 m)   Constitutional: appears age-appropriate, frail, NAD, calm Eyes: PERRL, lids and conjunctivae normal ENMT: Mucous membranes are moist. Posterior pharynx clear of any exudate or lesions. Age-appropriate dentition. Hearing appropriate Neck: normal, supple, no masses, no thyromegaly Respiratory: clear to auscultation bilaterally, no wheezing, no crackles. Normal respiratory effort. No accessory muscle use.  Cardiovascular: Regular rate and rhythm, no murmurs / rubs / gallops. No extremity edema. 2+ pedal pulses. No carotid bruits.  Abdomen: no tenderness,  no masses palpated, no hepatosplenomegaly. Bowel sounds positive.  Musculoskeletal: no clubbing / cyanosis. No joint deformity upper and lower extremities. Good ROM, no contractures, no atrophy. Normal muscle tone.  Skin: no rashes, lesions, ulcers. No induration Neurologic: Sensation intact. Strength 5/5 in all 4.  Psychiatric:  Alert and oriented x 3.  Depressed mood.  Flat affect  EKG: Not indicated at this time  Chest x-ray on Admission: not indicated at this time  CT CORONARY MORPH W/CTA COR W/SCORE W/CA W/CM &/OR WO/CM Addendum Date: 01/01/2024 ADDENDUM REPORT: 01/01/2024 18:12 EXAM: OVER-READ INTERPRETATION  CT CHEST The following report is an over-read performed by radiologist Dr. Oneil Devonshire of [facility] on 01/01/2024. This over-read does not include interpretation of cardiac or coronary anatomy or pathology. The coronary calcium  score/coronary CTA interpretation by the cardiologist is attached. COMPARISON:  None. FINDINGS: Cardiovascular: There are no significant extracardiac vascular findings. Mediastinum/Nodes: There are no enlarged lymph nodes within the visualized mediastinum. Lungs/Pleura: There is no pleural effusion. The visualized lungs appear clear. Mild emphysematous changes are noted. Upper abdomen: No significant findings in the visualized upper abdomen. Musculoskeletal/Chest wall: No chest wall mass or suspicious osseous findings within the visualized chest. IMPRESSION: Emphysema (ICD10-J43.9). Electronically Signed   By: Oneil Devonshire M.D.   On: 01/01/2024 18:12   Result Date: 01/01/2024 CLINICAL DATA:  Chest pain EXAM: Cardiac/Coronary CTA TECHNIQUE: A non-contrast, gated CT  scan was obtained with axial slices of 3 mm through the heart for calcium  scoring. Calcium  scoring was performed using the Agatston method. A 120 kV prospective, gated, contrast cardiac scan was obtained. Gantry rotation speed was 250 msecs and collimation was 0.6 mm. Two sublingual nitroglycerin   tablets (0.8 mg) were given. The 3D data set was reconstructed in 5% intervals of the 35-75% of the R-R cycle. Diastolic phases were analyzed on a dedicated workstation using MPR, MIP, and VRT modes. The patient received 95 cc of contrast. FINDINGS: Image quality: Excellent. Noise artifact is: Limited. Coronary Arteries:  Normal coronary origin.  Left dominance. Left main: The left main is a large caliber vessel with a normal take off from the left coronary cusp that trifurcates into a LAD, LCX, and ramus intermedius. There is no plaque or stenosis. Left anterior descending artery: The LAD is patent without evidence of plaque or stenosis. The LAD gives off 2 patent diagonal branches. Ramus intermedius: Patent with no evidence of plaque or stenosis. Left circumflex artery: The LCX is dominant and patent with no evidence of plaque or stenosis. The LCX gives off 2 patent obtuse marginal branches and terminates in a LPDA and LPLA. There is moderate soft plaque in the proximal to mid LCx with associated stenosis of 50-69%. Right coronary artery: The RCA is non-dominant with normal take off from the right coronary cusp. There is no evidence of plaque or stenosis. Right Atrium: Right atrial size is within normal limits. Right Ventricle: The right ventricular cavity is within normal limits. Left Atrium: Left atrial size is normal in size with no left atrial appendage filling defect. Left Ventricle: The ventricular cavity size is within normal limits. Pulmonary arteries: Normal in size. Pulmonary veins: Normal pulmonary venous drainage. Pericardium: Normal thickness without significant effusion or calcium  present. Cardiac valves: The aortic valve is trileaflet without significant calcification. The mitral valve is normal without significant calcification. Aorta: Normal caliber without significant disease. Extra-cardiac findings: See attached radiology report for non-cardiac structures. IMPRESSION: 1. Coronary calcium  score  of 0. This was 0 percentile for age-, sex, and race-matched controls. 2. Total plaque volume 12 mm3 which is 7th percentile for age- and sex-matched controls (calcified plaque 21mm3; non-calcified plaque 48mm3). TPV is mild. 3. Normal coronary origin with left dominance. 4. Moderate atherosclerosis: 50-69% proximal to mid LCx. 5. Recommend preventive therapy and risk factor modification. 6. This study has been submitted for FFR analysis. RECOMMENDATIONS: 1. CAD-RADS 0: No evidence of CAD (0%). Consider non-atherosclerotic causes of chest pain. 2. CAD-RADS 1: Minimal non-obstructive CAD (0-24%). Consider non-atherosclerotic causes of chest pain. Consider preventive therapy and risk factor modification. 3. CAD-RADS 2: Mild non-obstructive CAD (25-49%). Consider non-atherosclerotic causes of chest pain. Consider preventive therapy and risk factor modification. 4. CAD-RADS 3: Moderate stenosis. Consider symptom-guided anti-ischemic pharmacotherapy as well as risk factor modification per guideline directed care. Additional analysis with CT FFR will be submitted. 5. CAD-RADS 4: Severe stenosis. (70-99% or > 50% left main). Cardiac catheterization or CT FFR is recommended. Consider symptom-guided anti-ischemic pharmacotherapy as well as risk factor modification per guideline directed care. Invasive coronary angiography recommended with revascularization per published guideline statements. 6. CAD-RADS 5: Total coronary occlusion (100%). Consider cardiac catheterization or viability assessment. Consider symptom-guided anti-ischemic pharmacotherapy as well as risk factor modification per guideline directed care. 7. CAD-RADS N: Non-diagnostic study. Obstructive CAD can't be excluded. Alternative evaluation is recommended. Wilbert Bihari, MD Electronically Signed: By: Wilbert Bihari M.D. On: 01/01/2024 17:38   CT CORONARY  FRACTIONAL FLOW RESERVE FLUID ANALYSIS Result Date: 01/01/2024 EXAM: FFRCT ANALYSIS FINDINGS: FFRct analysis  was performed on the original cardiac CT angiogram dataset. Diagrammatic representation of the FFRct analysis is provided in a separate PDF document in PACS. This dictation was created using the PDF document and an interactive 3D model of the results. 3D model is not available in the EMR/PACS. Normal FFR range is >0.80. 1. Left Main: No stenosis. FFR = 1.00. 2. LAD: No significant stenosis. Proximal FFR = 0.96, Mid FFR = 0.90, Distal FFR = 0.82 3. LCX: No significant stenosis. Proximal FFR = 0.99, Mid FFR = 0.91, Distal FFR =0.86. 4. Ramus: No modeled. 5. RCA: No stenosis. Proximal FFR = 0.99, Mid FFR = 0.94. IMPRESSION: 1. Coronary CTA FFR demonstrates no hemodynamically flow limiting lesions. Wilbert Bihari, MD Electronically Signed   By: Wilbert Bihari M.D.   On: 01/01/2024 17:52   ECHOCARDIOGRAM COMPLETE Result Date: 01/01/2024    ECHOCARDIOGRAM REPORT   Patient Name:   FRENCH KENDRA [facility] Date of Exam: 01/01/2024 Medical Rec #:  [id number]        Height:       70.0 in Accession #:    [id number]       Weight:       150.0 lb Date of Birth:  September 18, 1961         BSA:          1.847 m Patient Age:    62 years         BP:           139/79 mmHg Patient Gender: M                HR:           51 bpm. Exam Location:  Inpatient Procedure: 2D Echo, 3D Echo, Color Doppler, Cardiac Doppler and Strain Analysis Indications:    R07.89 Other chest pain  History:        Patient has prior history of Echocardiogram examinations, most                 recent 10/21/2021. Risk Factors:Hypertension.  Sonographer:    Lanell Maduro Referring Phys: [id number] RONDELL A SMITH IMPRESSIONS  1. Left ventricular ejection fraction, by estimation, is 60 to 65%. The left ventricle has normal function. The left ventricle has no regional wall motion abnormalities. There is mild left ventricular hypertrophy. Left ventricular diastolic parameters are indeterminate. The average left ventricular global longitudinal strain is -18.0 %. The global longitudinal  strain is normal.  2. Right ventricular systolic function is normal. The right ventricular size is normal. There is normal pulmonary artery systolic pressure. The estimated right ventricular systolic pressure is 23.1 mmHg.  3. The mitral valve is normal in structure. Mild mitral valve regurgitation.  4. The aortic valve is tricuspid. Aortic valve regurgitation is not visualized.  5. The inferior vena cava is normal in size with <50% respiratory variability, suggesting right atrial pressure of 8 mmHg. FINDINGS  Left Ventricle: Left ventricular ejection fraction, by estimation, is 60 to 65%. The left ventricle has normal function. The left ventricle has no regional wall motion abnormalities. The average left ventricular global longitudinal strain is -18.0 %. The global longitudinal strain is normal. The left ventricular internal cavity size was normal in size. There is mild left ventricular hypertrophy. Left ventricular diastolic parameters are indeterminate. Right Ventricle: The right ventricular size is normal. Right ventricular systolic function is normal. There is normal pulmonary artery systolic pressure.  The tricuspid regurgitant velocity is 1.94 m/s, and with an assumed right atrial pressure of 8 mmHg,  the estimated right ventricular systolic pressure is 23.1 mmHg. Left Atrium: Left atrial size was normal in size. Right Atrium: Right atrial size was normal in size. Pericardium: There is no evidence of pericardial effusion. Mitral Valve: The mitral valve is normal in structure. Mild mitral valve regurgitation. Tricuspid Valve: Tricuspid valve regurgitation is trivial. Aortic Valve: The aortic valve is tricuspid. Aortic valve regurgitation is not visualized. Pulmonic Valve: The pulmonic valve was not well visualized. Aorta: The aortic root is normal in size and structure. Venous: The inferior vena cava is normal in size with less than 50% respiratory variability, suggesting right atrial pressure of 8 mmHg.  IAS/Shunts: No atrial level shunt detected by color flow Doppler.  LEFT VENTRICLE PLAX 2D LVIDd:         4.60 cm     Diastology LVIDs:         2.35 cm     LV e' medial:    7.18 cm/s LV PW:         1.00 cm     LV E/e' medial:  12.2 LV IVS:        1.10 cm     LV e' lateral:   9.36 cm/s LVOT diam:     1.70 cm     LV E/e' lateral: 9.4 LV SV:         51 LV SV Index:   28          2D Longitudinal Strain LVOT Area:     2.27 cm    2D Strain GLS Avg:     -18.0 %  LV Volumes (MOD) LV vol d, MOD A2C: 93.2 ml 3D Volume EF: LV vol d, MOD A4C: 75.0 ml 3D EF:        62 % LV vol s, MOD A2C: 21.2 ml LV EDV:       178 ml LV vol s, MOD A4C: 27.6 ml LV ESV:       67 ml LV SV MOD A2C:     72.0 ml LV SV:        111 ml LV SV MOD A4C:     75.0 ml LV SV MOD BP:      60.0 ml RIGHT VENTRICLE            IVC RV S prime:     9.14 cm/s  IVC diam: 1.80 cm TAPSE (M-mode): 1.8 cm LEFT ATRIUM             Index        RIGHT ATRIUM           Index LA Vol (A2C):   42.4 ml 22.95 ml/m  RA Area:     14.70 cm LA Vol (A4C):   43.5 ml 23.55 ml/m  RA Volume:   36.30 ml  19.65 ml/m LA Biplane Vol: 42.9 ml 23.23 ml/m  AORTIC VALVE LVOT Vmax:   133.00 cm/s LVOT Vmean:  84.000 cm/s LVOT VTI:    0.226 m  AORTA Ao Root diam: 3.80 cm MITRAL VALVE               TRICUSPID VALVE MV Area (PHT): 4.06 cm    TR Peak grad:   15.1 mmHg MV Decel Time: 187 msec    TR Vmax:        194.00 cm/s MV E velocity: 87.80 cm/s MV A velocity: 56.30 cm/s  SHUNTS MV E/A ratio:  1.56        Systemic VTI:  0.23 m                            Systemic Diam: 1.70 cm Ronal Ross Electronically signed by Ronal Ross Signature Date/Time: 01/01/2024/5:00:53 PM    Final    Labs on Admission: I have personally reviewed following labs  CBC: Recent Labs  Lab 12/31/23 1450  WBC 6.4  NEUTROABS 4.5  HGB 15.4  HCT 47.4  MCV 92.0  PLT 306   Basic Metabolic Panel: Recent Labs  Lab 12/31/23 1450 01/02/24 1148  NA 141 138  K 4.3 4.4  CL 105 108  CO2 27 24  GLUCOSE 94 77  BUN 28* 18   CREATININE 1.51* 1.18  CALCIUM  9.0 8.5*  MG 2.0  --    GFR: Estimated Creatinine Clearance: 60.8 mL/min (by C-G formula based on SCr of 1.18 mg/dL).  Liver Function Tests: Recent Labs  Lab 12/31/23 1450  AST 16  ALT 12  ALKPHOS 57  BILITOT 0.5  PROT 6.8  ALBUMIN 3.5   HbA1C: Recent Labs    12/31/23 1450  HGBA1C 5.8*   Lipid Profile: Recent Labs    12/31/23 1450  CHOL 174  HDL 73  LDLCALC 89  TRIG 60  CHOLHDL 2.4   Thyroid  Function Tests: Recent Labs    12/31/23 1450  TSH 0.767  FREET4 0.92  T3FREE 2.4   Anemia Panel: Recent Labs    12/31/23 1450  VITAMINB12 404   Urine analysis:    Component Value Date/Time   COLORURINE YELLOW 01/02/2024 0450   APPEARANCEUR CLEAR 01/02/2024 0450   LABSPEC 1.023 01/02/2024 0450   PHURINE 6.0 01/02/2024 0450   GLUCOSEU NEGATIVE 01/02/2024 0450   HGBUR NEGATIVE 01/02/2024 0450   BILIRUBINUR NEGATIVE 01/02/2024 0450   KETONESUR NEGATIVE 01/02/2024 0450   PROTEINUR NEGATIVE 01/02/2024 0450   NITRITE NEGATIVE 01/02/2024 0450   LEUKOCYTESUR NEGATIVE 01/02/2024 0450   This document was prepared using Dragon Voice Recognition software and may include unintentional dictation errors.  Dr. Sherre [facility]  If 7PM-7AM, please contact overnight-coverage provider If 7AM-7PM, please contact day attending provider [email or web address]  01/02/2024, 6:53 PM

## 2024-01-02 NOTE — Assessment & Plan Note (Signed)
 Per primary team

## 2024-01-02 NOTE — Group Note (Unsigned)
 Date:  01/02/2024 Time:  9:48 PM  Group Topic/Focus:  Making Healthy Choices:   The focus of this group is to help patients identify negative/unhealthy choices they were using prior to admission and identify positive/healthier coping strategies to replace them upon discharge.     Participation Level:  {BHH PARTICIPATION OZCZO:77735}  Participation Quality:  {BHH PARTICIPATION QUALITY:22265}  Affect:  {BHH AFFECT:22266}  Cognitive:  {BHH COGNITIVE:22267}  Insight: {BHH Insight2:20797}  Engagement in Group:  {BHH ENGAGEMENT IN HMNLE:77731}  Modes of Intervention:  {BHH MODES OF INTERVENTION:22269}  Additional Comments:  ***  Randy Collins 01/02/2024, 9:48 PM

## 2024-01-02 NOTE — Plan of Care (Signed)

## 2024-01-02 NOTE — Progress Notes (Signed)
 Report called by telephone to Jeanann Lewandowsky., RN at receiving facility, [facility].  Patient awaiting transportation to facility.  Will continue to monitor.

## 2024-01-02 NOTE — Group Note (Signed)
 Date:  01/02/2024 Time:  9:50 PM  Group Topic/Focus:  Making Healthy Choices:   The focus of this group is to help patients identify negative/unhealthy choices they were using prior to admission and identify positive/healthier coping strategies to replace them upon discharge.    Participation Level:  Active  Participation Quality:  Appropriate  Affect:  Appropriate  Cognitive:  Appropriate  Insight: Appropriate  Engagement in Group:  Engaged  Modes of Intervention:  Discussion    Randy Collins 01/02/2024, 9:50 PM

## 2024-01-02 NOTE — TOC Transition Note (Signed)
 Transition of Care [facility]) - Discharge Note   Patient Details  Name: Randy Collins MRN: [id number] Date of Birth: 30-Nov-1961  Transition [facility]) CM/SW Contact:  Isaiah Public, LCSWA Phone Number: 01/02/2024, 11:43 AM   Clinical Narrative:     Patient will DC to: BMU AT [facility]. Attending MD Cam. DX MDD bed assignment 304  Anticipated DC date: 01/02/2024  Family notified: Patient declined   Transport by: Safe Transport  ?  Per MD patient ready for DC to BMU at [facility] . RN, patient, and facility notified of DC.RN given number for report [phone]  . DC packet on chart. Safe transport requested for patient.  CSW signing off.     Barriers to Discharge: No Barriers Identified   Patient Goals and CMS Choice Patient states their goals for this hospitalization and ongoing recovery are:: BMU [facility]   Choice offered to / list presented to : Patient      Discharge Placement                       Discharge Plan and Services Additional resources added to the After Visit Summary for   In-house Referral: Clinical Social Work                                   Social Drivers of Health (SDOH) Interventions SDOH Screenings   Food Insecurity: Food Insecurity Present (01/01/2024)  Housing: Patient Declined (01/01/2024)  Transportation Needs: No Transportation Needs (01/01/2024)  Utilities: At Risk (01/01/2024)  Depression (PHQ2-9): Medium Risk (10/25/2021)  Social Connections: Socially Isolated (01/01/2024)  Stress: Stress Concern Present (10/31/2018)  Tobacco Use: High Risk (12/31/2023)     Readmission Risk Interventions     No data to display

## 2024-01-02 NOTE — Assessment & Plan Note (Addendum)
 Uncontrolled Several blood pressure reading including 05/13/2023: 191/110 05/15/2023: 177/94 12/31/2023: 150/98 01/01/2024: Several blood pressure reading from 149-162/89-99 Per cardiology note on 01/02/2024: Resume home amlodipine  10 mg daily Amlodipine  resume for 01/02/2023 Add: Hydralazine  10 mg p.o. every 6 hours as needed for SBP greater 175, 5 days ordered Given patient's long history of uncontrolled blood pressure and medication noncompliance, avoid tight blood pressure control

## 2024-01-03 ENCOUNTER — Encounter: Payer: Self-pay | Admitting: Family

## 2024-01-03 DIAGNOSIS — F1210 Cannabis abuse, uncomplicated: Secondary | ICD-10-CM | POA: Insufficient documentation

## 2024-01-03 DIAGNOSIS — F1410 Cocaine abuse, uncomplicated: Secondary | ICD-10-CM | POA: Insufficient documentation

## 2024-01-03 DIAGNOSIS — F251 Schizoaffective disorder, depressive type: Secondary | ICD-10-CM | POA: Diagnosis not present

## 2024-01-03 DIAGNOSIS — F1010 Alcohol abuse, uncomplicated: Secondary | ICD-10-CM | POA: Insufficient documentation

## 2024-01-03 MED ORDER — BENZTROPINE MESYLATE 1 MG PO TABS: 1.0000 mg | ORAL_TABLET | Freq: Every day | ORAL | Status: DC | PRN

## 2024-01-03 MED ORDER — ENSURE ENLIVE PO LIQD
237.0000 mL | Freq: Two times a day (BID) | ORAL | Status: DC
Start: 2024-01-03 — End: 2024-01-08
  Administered 2024-01-03 – 2024-01-07 (×5): 237 mL via ORAL

## 2024-01-03 MED ORDER — HALOPERIDOL 5 MG PO TABS
2.5000 mg | ORAL_TABLET | Freq: Two times a day (BID) | ORAL | Status: DC
  Administered 2024-01-03 – 2024-01-08 (×10): 2.5 mg via ORAL
  Filled 2024-01-03 (×10): qty 1

## 2024-01-03 MED ORDER — ESCITALOPRAM OXALATE 10 MG PO TABS
10.0000 mg | ORAL_TABLET | Freq: Every day | ORAL | Status: DC
Start: 2024-01-03 — End: 2024-01-08
  Administered 2024-01-03 – 2024-01-08 (×6): 10 mg via ORAL
  Filled 2024-01-03 (×6): qty 1

## 2024-01-03 MED ORDER — HYDROXYZINE HCL 25 MG PO TABS
25.0000 mg | ORAL_TABLET | Freq: Three times a day (TID) | ORAL | Status: DC | PRN
  Administered 2024-01-03: 25 mg via ORAL
  Filled 2024-01-03: qty 1

## 2024-01-03 MED ORDER — TRAZODONE HCL 50 MG PO TABS
50.0000 mg | ORAL_TABLET | Freq: Every evening | ORAL | Status: DC | PRN
  Administered 2024-01-05 – 2024-01-06 (×2): 50 mg via ORAL
  Filled 2024-01-03 (×2): qty 1

## 2024-01-03 MED ORDER — MELATONIN 5 MG PO TABS
5.0000 mg | ORAL_TABLET | Freq: Every day | ORAL | Status: DC
  Administered 2024-01-03 – 2024-01-07 (×5): 5 mg via ORAL
  Filled 2024-01-03 (×5): qty 1

## 2024-01-03 NOTE — Progress Notes (Signed)
 This clinical research associate reached out to Pharmacy to obtain PRN Hydralazine  due to patient's BP. Once medication is available, it will be administered.   01/03/24 1647  Vital Signs  Temp 98 F (36.7 C)  Temp Source Oral  Pulse Rate 68  Pulse Rate Source Monitor  Resp 16  BP (!) 183/89  BP Location Left Arm  BP Method Automatic  Patient Position (if appropriate) Standing  Oxygen Therapy  SpO2 100 %  O2 Device Room Air

## 2024-01-03 NOTE — Progress Notes (Signed)
 PRN Hydralazine was given at 1748 and the re-check is as follows:   01/03/24 1851  Vital Signs  Pulse Rate 71  BP (!) 171/91  BP Location Left Arm  BP Method Automatic  Patient Position (if appropriate) Sitting

## 2024-01-03 NOTE — Progress Notes (Signed)
   01/02/24 1924  Psych Admission Type (Psych Patients Only)  Admission Status Voluntary  Psychosocial Assessment  Patient Complaints Depression  Eye Contact Brief  Facial Expression Sad  Affect Sad  Speech Logical/coherent  Interaction Minimal  Motor Activity Slow  Appearance/Hygiene In scrubs  Behavior Characteristics Cooperative;Appropriate to situation  Mood Depressed  Aggressive Behavior  Effect No apparent injury  Thought Process  Coherency WDL  Content WDL  Delusions None reported or observed  Perception WDL  Hallucination None reported or observed  Judgment WDL  Confusion WDL  Danger to Self  Current suicidal ideation? Denies  Agreement Not to Harm Self Yes  Danger to Others  Danger to Others None reported or observed

## 2024-01-03 NOTE — H&P (Signed)
 Psychiatric Admission Assessment Adult  Patient Identification: Randy Collins MRN:  [id number] Date of Evaluation:  01/03/2024 Chief Complaint:  Schizoaffective disorder, depressive type (HCC) [F25.1] Principal Diagnosis: Schizoaffective disorder, depressive type (HCC) Diagnosis:  Principal Problem:   Schizoaffective disorder, depressive type (HCC) Active Problems:   Essential hypertension   Cocaine abuse (HCC)   ETOH abuse   Marijuana abuse  History of Present Illness: 63 year old African American male with a past psychiatric history of schizophrenia, depression, and anxiety, presenting for inpatient psychiatric evaluation after expressing acute suicidal ideation, including a plan to drink Drano. He reports active auditory and visual hallucinations, daily cocaine use, and significant psychosocial stressors, including homelessness and estrangement from his family. The patient describes a long history of mental health challenges, worsened by substance use and a lack of stable living conditions. He admits to using cocaine up to 20 times daily (last use this morning), occasional marijuana use, and drinking 1-2 beers. He notes a 10-year period of sobriety during incarceration but relapsed within a week of release in May 2019. Since then, he reports escalating substance use and difficulty managing his mental health symptoms. He endorses depression symptoms, including low mood, irritability, and guilt, as well as anxiety and insomnia. The patient states he has experienced auditory and visual hallucinations for years but feels they have become more distressing and persistent recently. He denies homicidal ideation but admits to ongoing suicidal ideation, compounded by feelings of hopelessness due to his current circumstances. He last took his prescribed blood pressure medication (amlodipine ) over a week ago and is not on any other medications at this time. The patient reports a family history of  schizophrenia (mother, sister, father) and alcohol use disorder (father). He acknowledges past episodes of delirium tremens (DTs) with shakes and nausea and expresses an interest in residential substance abuse treatment after psychiatric stabilization. Associated Signs/Symptoms: Depression Symptoms:  depressed mood, insomnia, fatigue, feelings of worthlessness/guilt, hopelessness, suicidal thoughts with specific plan, anxiety, disturbed sleep, weight loss, (Hypo) Manic Symptoms:  Delusions, Hallucinations, Impulsivity, Irritable Mood, Anxiety Symptoms:  Excessive Worry, Psychotic Symptoms:  Delusions, Hallucinations: Auditory Command:  To kill someone Visual PTSD Symptoms: Negative Total Time spent with patient: 2 hours  Past Psychiatric History: Schizophrenia, poly substance abuse  Is the patient at risk to self? Yes.    Has the patient been a risk to self in the past 6 months? Yes.    Has the patient been a risk to self within the distant past? Yes.    Is the patient a risk to others? Yes.    Has the patient been a risk to others in the past 6 months? Yes.    Has the patient been a risk to others within the distant past? Yes.     Columbia Scale:  Flowsheet Row Admission (Current) from 01/02/2024 in [facility] INPATIENT BEH[facility]IORAL MEDICINE Most recent reading at 01/02/2024  5:00 PM ED to Hosp-Admission (Discharged) from 12/31/2023 in [facility] Most recent reading at 01/01/2024  9:45 PM ED from 12/31/2023 in [facility] Most recent reading at 12/31/2023  4:06 PM  C-SSRS RISK CATEGORY High Risk High Risk High Risk        Prior Inpatient Therapy: No. If yes, describe: None   Prior Outpatient Therapy: Yes.   If yes, describe: unable to remember   Alcohol Screening: 1. How often do you have a drink containing alcohol?: 4 or more times a week 2. How many drinks containing alcohol do you  have on a typical day when you are drinking?: 10 or  more 3. How often do you have six or more drinks on one occasion?: Daily or almost daily AUDIT-C Score: 12 4. How often during the last year have you found that you were not able to stop drinking once you had started?: Daily or almost daily 5. How often during the last year have you failed to do what was normally expected from you because of drinking?: Daily or almost daily 6. How often during the last year have you needed a first drink in the morning to get yourself going after a heavy drinking session?: Daily or almost daily 7. How often during the last year have you had a feeling of guilt of remorse after drinking?: Daily or almost daily 8. How often during the last year have you been unable to remember what happened the night before because you had been drinking?: Daily or almost daily 9. Have you or someone else been injured as a result of your drinking?: No 10. Has a relative or friend or a doctor or another health worker been concerned about your drinking or suggested you cut down?: Yes, during the last year Alcohol Use Disorder Identification Test Final Score (AUDIT): 36 Substance Abuse History in the last 12 months:  Yes.   Consequences of Substance Abuse: Medical Consequences:  High blood pressure Previous Psychotropic Medications: Yes  Psychological Evaluations: No  Past Medical History:  Past Medical History:  Diagnosis Date   Arthritis 2009   likely psoriatic arthritis--inflammatory, but not symmetric, left hip and back, right elbow, both knees   Hypertension 2014   Psoriasis 2009   Right lumbar radiculopathy 2019   History reviewed. No pertinent surgical history. Family History:  Family History  Problem Relation Age of Onset   Hypertension Mother    Arthritis Mother    Gout Mother    Hyperlipidemia Mother    Gout Father    Alcohol abuse Father        probable cirrhosis   Heart disease Brother 14       AMI   HIV/AIDS Brother    Cancer Brother        not sure what  primary   Family Psychiatric  History:  Mother: Diagnosed with schizophrenia. Father: History of schizophrenia and alcohol use disorder. Sister: Diagnosed with schizophrenia. Tobacco Screening:  Social History   Tobacco Use  Smoking Status Every Day   Current packs/day: 1.00   Types: Cigarettes  Smokeless Tobacco Never  Tobacco Comments   [facility]    [facility] Counseling     Are you interested in Tobacco Cessation Medications?  Yes, implement Nicotene Replacement Protocol Counseled patient on smoking cessation:  Refused/Declined practical counseling Reason Tobacco Screening Not Completed: No value filed.       Social History:  Social History   Substance and Sexual Activity  Alcohol Use Yes   Comment: History of abuse.  Occasional beer now.     Social History   Substance and Sexual Activity  Drug Use Yes   Types: Crack cocaine, Marijuana   Comment: history of speed, MJ, Acid.  Last time use was 1999    Additional Social History:                           Allergies:  No Known Allergies Lab Results:  Results for orders placed or performed during the hospital encounter of  12/31/23 (from the past 48 hours)  HIV Antibody (routine testing w rflx)     Status: None   Collection Time: 01/01/24  8:23 PM  Result Value Ref Range   HIV Screen 4th Generation wRfx Non Reactive Non Reactive    Comment: Performed at [facility], [address], [place], [place] [address]  D-dimer, quantitative     Status: None   Collection Time: 01/01/24  8:23 PM  Result Value Ref Range   D-Dimer, Quant <0.27 0.00 - 0.50 ug/mL-FEU    Comment: (NOTE) At the manufacturer cut-off value of 0.5 g/mL FEU, this assay has a negative predictive value of 95-100%.This assay is intended for use in conjunction with a clinical pretest probability (PTP) assessment model to exclude pulmonary embolism (PE) and deep venous thrombosis (DVT) in outpatients  suspected of PE or DVT. Results should be correlated with clinical presentation. Performed at [facility], [address], [place], [place] [address]   Urinalysis, Routine w reflex microscopic -Urine, Clean Catch     Status: None   Collection Time: 01/02/24  4:50 AM  Result Value Ref Range   Color, Urine YELLOW YELLOW   APPearance CLEAR CLEAR   Specific Gravity, Urine 1.023 1.005 - 1.030   pH 6.0 5.0 - 8.0   Glucose, UA NEGATIVE NEGATIVE mg/dL   Hgb urine dipstick NEGATIVE NEGATIVE   Bilirubin Urine NEGATIVE NEGATIVE   Ketones, ur NEGATIVE NEGATIVE mg/dL   Protein, ur NEGATIVE NEGATIVE mg/dL   Nitrite NEGATIVE NEGATIVE   Leukocytes,Ua NEGATIVE NEGATIVE    Comment: Performed at [facility], [address], [place], [place] [address]  Basic metabolic panel     Status: Abnormal   Collection Time: 01/02/24 11:48 AM  Result Value Ref Range   Sodium 138 135 - 145 mmol/L   Potassium 4.4 3.5 - 5.1 mmol/L   Chloride 108 98 - 111 mmol/L   CO2 24 22 - 32 mmol/L   Glucose, Bld 77 70 - 99 mg/dL    Comment: Glucose reference range applies only to samples taken after fasting for at least 8 hours.   BUN 18 8 - 23 mg/dL   Creatinine, Ser 8.81 0.61 - 1.24 mg/dL   Calcium  8.5 (L) 8.9 - 10.3 mg/dL   GFR, Estimated >39 >39 mL/min    Comment: (NOTE) Calculated using the CKD-EPI Creatinine Equation (2021)    Anion gap 6 5 - 15    Comment: Performed at [facility], [address] [place], [place] [address]    Blood Alcohol level:  Lab Results  Component Value Date   [facility] <10 12/31/2023   ETH <10 10/20/2021    Metabolic Disorder Labs:  Lab Results  Component Value Date   HGBA1C 5.8 (H) 12/31/2023   MPG 120 12/31/2023   MPG 108.28 10/21/2021   No results found for: PROLACTIN Lab Results  Component Value Date   CHOL 174 12/31/2023   TRIG 60 12/31/2023   HDL 73 12/31/2023   CHOLHDL 2.4 12/31/2023   VLDL 12 12/31/2023   LDLCALC 89 12/31/2023   LDLCALC 73  06/29/2022    Current Medications: Current Facility-Administered Medications  Medication Dose Route Frequency Provider Last Rate Last Admin   acetaminophen  (TYLENOL ) tablet 650 mg  650 mg Oral Q6H PRN Starkes-Perry, Takia S, FNP       alum & mag hydroxide-simeth (MAALOX/MYLANTA) 200-200-20 MG/5ML suspension 30 mL  30 mL Oral Q4H PRN Starkes-Perry, Majel RAMAN, FNP  amLODipine  (NORVASC ) tablet 10 mg  10 mg Oral Daily Cox, Amy N, DO   10 mg at 01/03/24 0831   aspirin  EC tablet 81 mg  81 mg Oral Daily Starkes-Perry, Takia S, FNP   81 mg at 01/03/24 0831   atorvastatin  (LIPITOR) tablet 80 mg  80 mg Oral QHS Starkes-Perry, Takia S, FNP   80 mg at 01/02/24 2121   hydrALAZINE  (APRESOLINE ) tablet 10 mg  10 mg Oral Q6H PRN Cox, Amy N, DO       hydrOXYzine  (ATARAX ) tablet 25 mg  25 mg Oral TID PRN Nicholaus Brad RAMAN, NP   25 mg at 01/03/24 1040   irbesartan  (AVAPRO ) tablet 150 mg  150 mg Oral Daily Starkes-Perry, Takia S, FNP   150 mg at 01/03/24 0831   loratadine  (CLARITIN ) tablet 10 mg  10 mg Oral Daily Starkes-Perry, Takia S, FNP   10 mg at 01/03/24 0831   magnesium  hydroxide (MILK OF MAGNESIA) suspension 30 mL  30 mL Oral Daily PRN Starkes-Perry, Takia S, FNP       melatonin tablet 5 mg  5 mg Oral QHS Nicholaus Brad RAMAN, NP       nicotine  (NICODERM CQ  - dosed in mg/24 hours) patch 14 mg  14 mg Transdermal Daily Wilkie Majel RAMAN, FNP   14 mg at 01/03/24 0831   OLANZapine  (ZYPREXA ) injection 5 mg  5 mg Intramuscular TID PRN Starkes-Perry, Takia S, FNP       OLANZapine  zydis (ZYPREXA ) disintegrating tablet 5 mg  5 mg Oral TID PRN Starkes-Perry, Takia S, FNP       pantoprazole  (PROTONIX ) EC tablet 40 mg  40 mg Oral Daily Starkes-Perry, Takia S, FNP   40 mg at 01/03/24 0831   PTA Medications: Medications Prior to Admission  Medication Sig Dispense Refill Last Dose/Taking   aspirin  81 MG EC tablet Take 1 tablet (81 mg total) by mouth daily. Swallow whole. 30 tablet 11    atorvastatin  (LIPITOR) 80 MG  tablet Take 1 tablet (80 mg total) by mouth at bedtime. 90 tablet 0    cetirizine  (ZYRTEC ) 10 MG tablet Take 10 mg by mouth daily.      irbesartan  (AVAPRO ) 150 MG tablet Take 1 tablet (150 mg total) by mouth daily. 90 tablet 0    nicotine  (NICODERM CQ  - DOSED IN MG/24 HOURS) 14 mg/24hr patch Place 1 patch (14 mg total) onto the skin daily. 28 patch 0    pantoprazole  (PROTONIX ) 40 MG tablet Take 1 tablet (40 mg total) by mouth daily. 30 tablet 0    Vitamins A & D (VITAMIN A & D) ointment Apply 1 Application topically as needed for dry skin.       Musculoskeletal: Strength & Muscle Tone: within normal limits Gait & Station: normal Patient leans: N/A            Psychiatric Specialty Exam:  Presentation  General Appearance:  Disheveled  Eye Contact: Good  Speech: Clear and Coherent; Normal Rate  Speech Volume: Normal  Handedness: Right   Mood and Affect  Mood: Depressed; Euthymic  Affect: Flat   Thought Process  Thought Processes: Goal Directed  Duration of Psychotic Symptoms: Auditory Hallucinations (AH):Persistent voices that have been ongoing for years. The content of the voices was not specified but is described as distressing and intrusive. Visual Hallucinations (VH):Reports seeing things that others do not see, which he finds disturbing. Paranoia:Though not explicitly stated, his hallucinations and behavior may contribute to feelings of fear or  mistrust in certain situations. Behavioral Manifestations:Increased distress from hallucinations leading to exacerbation of substance use and suicidal ideation. Past Diagnosis of Schizophrenia or Psychoactive disorder: Yes  Descriptions of Associations:Intact  Orientation:Full (Time, Place and Person)  Thought Content:Logical; Paranoid Ideation  Hallucinations:Hallucinations: Auditory; Visual Description of Auditory Hallucinations: CAH Description of Visual Hallucinations: person talking to him to calm  down  Ideas of Reference:Paranoia  Suicidal Thoughts:Suicidal Thoughts: Yes, Active SI Active Intent and/or Plan: With Means to Carry Out  Homicidal Thoughts:Homicidal Thoughts: Yes, Active HI Active Intent and/or Plan: With Intent; Without Means to [employer]  Memory: Immediate Fair; Remote Fair; Recent Fair  Judgment: Fair  Insight: Fair   Art Therapist  Concentration: Good  Attention Span: Good  Recall: Good  Fund of Knowledge: Good  Language: Good   Psychomotor Activity  Psychomotor Activity: Psychomotor Activity: Normal   Assets  Assets: Communication Skills; Desire for Improvement   Sleep  Sleep: Sleep: Poor Number of Hours of Sleep: 4    Physical Exam: Physical Exam Vitals and nursing note reviewed.  Constitutional:      Appearance: Normal appearance.  HENT:     Head: Normocephalic and atraumatic.  Pulmonary:     Effort: Pulmonary effort is normal.     Breath sounds: Normal breath sounds.  Musculoskeletal:        General: Normal range of motion.  Neurological:     General: No focal deficit present.     Mental Status: He is alert and oriented to person, place, and time.  Psychiatric:        Attention and Perception: Attention and perception normal.        Mood and Affect: Mood normal.        Speech: Speech normal.        Behavior: Behavior normal. Behavior is cooperative.        Thought Content: Thought content is paranoid and delusional. Thought content includes homicidal and suicidal ideation. Thought content includes homicidal and suicidal plan.        Cognition and Memory: Cognition and memory normal.        Judgment: Judgment is impulsive.    Review of Systems  Psychiatric/Behavioral:  Positive for depression, hallucinations, substance abuse and suicidal ideas. The patient is nervous/anxious and has insomnia.   All other systems reviewed and are negative.  Blood pressure (!) 145/92, pulse (!) 57,  temperature 98 F (36.7 C), temperature source Oral, resp. rate (!) 22, height 5' 10 (1.778 m), weight 67.1 kg, SpO2 99%. Body mass index is 21.24 kg/m.  Treatment Plan Summary: Daily contact with patient to assess and evaluate symptoms and progress in treatment and Medication management Haldol  (Haloperidol ) 2.5 mg BID to manage symptoms of schizophrenia, including auditory and visual hallucinations (AVH). Cogentin  (Benztropine ) 1 mg PRN to manage or prevent extrapyramidal symptoms (EPS) caused by antipsychotics like Haldol . Trazodone  50 mg PRN for insomnia and mood regulation. Give at bedtime if the patient has difficulty sleeping. Melatonin 3 mg HS to regulate sleep-wake cycles and assist with insomnia at bedtime for sleep induction. Lexapro  (Escitalopram ) 10 mg Daily for depression and anxiety management. Hydroxyzine  25 mg QID PRN for anxiety and agitation management. Reinitiate Amlodipine  10 mg daily for blood pressure control. Monitor blood pressure daily during hospitalization. Engage the patient in supportive therapy during inpatient stay to explore emotional triggers and coping mechanisms. Educate on managing schizophrenia symptoms and substance use recovery strategies. Connect with case management to secure transitional housing or shelter  placement.  Arrange placement in a residential substance abuse program. Outpatient psychiatry to monitor schizophrenia, depression, and anxiety. Primary care for hypertension management. Provide information on local mental health and substance use hotlines and support groups.  Observation Level/Precautions:  Continuous Observation 15 minute checks Seizure  Laboratory:  HbAIC lipids  Psychotherapy:    Medications:    Consultations:    Discharge Concerns:    Estimated LOS:  Other:     Physician Treatment Plan for Primary Diagnosis: Schizoaffective disorder, depressive type (HCC) Long Term Goal(s): Improvement in symptoms so as ready for  discharge  Short Term Goals: Ability to identify changes in lifestyle to reduce recurrence of condition will improve, Ability to verbalize feelings will improve, Ability to disclose and discuss suicidal ideas, Ability to demonstrate self-control will improve, Ability to identify and develop effective coping behaviors will improve, Ability to maintain clinical measurements within normal limits will improve, Compliance with prescribed medications will improve, and Ability to identify triggers associated with substance abuse/mental health issues will improve  Physician Treatment Plan for Secondary Diagnosis: Principal Problem:   Schizoaffective disorder, depressive type (HCC) Active Problems:   Essential hypertension   Cocaine abuse (HCC)   ETOH abuse   Marijuana abuse  Long Term Goal(s): Improvement in symptoms so as ready for discharge  Short Term Goals: Ability to identify changes in lifestyle to reduce recurrence of condition will improve, Ability to verbalize feelings will improve, Ability to disclose and discuss suicidal ideas, Ability to demonstrate self-control will improve, Ability to identify and develop effective coping behaviors will improve, Ability to maintain clinical measurements within normal limits will improve, Compliance with prescribed medications will improve, and Ability to identify triggers associated with substance abuse/mental health issues will improve  I certify that inpatient services furnished can reasonably be expected to improve the patient's condition.    Brad GORMAN Moats, NP 1/10/20252:11 PM

## 2024-01-03 NOTE — [facility] Suicide Risk Assessment (Signed)
 [facility] INPATIENT:  Family/Significant Other Suicide Prevention Education  Suicide Prevention Education:  Patient Refusal for Family/Significant Other Suicide Prevention Education: The patient Randy Collins has refused to provide written consent for family/significant other to be provided Family/Significant Other Suicide Prevention Education during admission and/or prior to discharge.  Physician notified.  SPE completed with pt, as pt refused to consent to family contact. SPI pamphlet provided to pt and pt was encouraged to share information with support network, ask questions, and talk about any concerns relating to SPE. Pt denies access to guns/firearms and verbalized understanding of information provided. Mobile Crisis information also provided to pt.  [name] Fam 01/03/2024, 3:31 PM
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
## 2024-01-03 NOTE — Group Note (Signed)
 Recreation Therapy Group Note   Group Topic:Leisure Education  Group Date: 01/03/2024 Start Time: 1000 End Time: 1100 Facilitators: Celestia Jeoffrey BRAVO, LRT, CTRS Location:  Craft Room  Group Description: Leisure. Patients were given the option to choose from singing karaoke, coloring mandalas, using oil pastels, journaling, or playing with play-doh. LRT and pts discussed the meaning of leisure, the importance of participating in leisure during their free time/when they're outside of the hospital, as well as how our leisure interests can also serve as coping skills.   Goal Area(s) Addressed:  Patient will identify a current leisure interest.  Patient will learn the definition of "leisure". Patient will practice making a positive decision. Patient will have the opportunity to try a new leisure activity. Patient will communicate with peers and LRT.    Affect/Mood: Appropriate   Participation Level: Active and Engaged    Clinical Observations/Individualized Feedback: Randy Collins was originally in the craft room for group. Pt was pulled by NP student. Pt was not present for majority of the group. Pt returned to group with 5 minutes remaining and chose to sing a [employer].   Plan: Continue to engage patient in RT group sessions 2-3x/week.   Jeoffrey BRAVO Celestia, LRT, CTRS 01/03/2024 11:57 AM

## 2024-01-03 NOTE — BHH Counselor (Signed)
 CSW attempted contact with the [facility] office [phone]). Contact was unable to be established but HIPAA compliant voicemail left with contact information for follow through.   Vilma Meckel. Algis Greenhouse, MSW, LCSW, LCAS 01/03/2024 2:47 PM

## 2024-01-03 NOTE — BH IP Treatment Plan (Signed)
 Interdisciplinary Treatment and Diagnostic Plan Update  01/03/2024 Time of Session: 09:22 Randy Collins MRN: [id number]  Principal Diagnosis: Schizoaffective disorder, depressive type [facility])  Secondary Diagnoses: Principal Problem:   Schizoaffective disorder, depressive type (HCC) Active Problems:   Essential hypertension   Current Medications:  Current Facility-Administered Medications  Medication Dose Route Frequency Provider Last Rate Last Admin   acetaminophen  (TYLENOL ) tablet 650 mg  650 mg Oral Q6H PRN Starkes-Perry, Takia S, FNP       alum & mag hydroxide-simeth (MAALOX/MYLANTA) 200-200-20 MG/5ML suspension 30 mL  30 mL Oral Q4H PRN Starkes-Perry, Takia S, FNP       amLODipine  (NORVASC ) tablet 10 mg  10 mg Oral Daily Cox, Amy N, DO   10 mg at 01/03/24 0831   aspirin  EC tablet 81 mg  81 mg Oral Daily Wilkie Majel GORMAN, FNP   81 mg at 01/03/24 0831   atorvastatin  (LIPITOR) tablet 80 mg  80 mg Oral QHS Starkes-Perry, Takia S, FNP   80 mg at 01/02/24 2121   hydrALAZINE  (APRESOLINE ) tablet 10 mg  10 mg Oral Q6H PRN Cox, Amy N, DO       irbesartan  (AVAPRO ) tablet 150 mg  150 mg Oral Daily Wilkie Majel GORMAN, FNP   150 mg at 01/03/24 0831   loratadine  (CLARITIN ) tablet 10 mg  10 mg Oral Daily Wilkie Majel GORMAN, FNP   10 mg at 01/03/24 0831   magnesium  hydroxide (MILK OF MAGNESIA) suspension 30 mL  30 mL Oral Daily PRN Wilkie Majel GORMAN, FNP       nicotine  (NICODERM CQ  - dosed in mg/24 hours) patch 14 mg  14 mg Transdermal Daily Wilkie Majel GORMAN, FNP   14 mg at 01/03/24 0831   OLANZapine  (ZYPREXA ) injection 5 mg  5 mg Intramuscular TID PRN Wilkie Majel GORMAN, FNP       OLANZapine  zydis (ZYPREXA ) disintegrating tablet 5 mg  5 mg Oral TID PRN Wilkie Majel GORMAN, FNP       pantoprazole  (PROTONIX ) EC tablet 40 mg  40 mg Oral Daily Starkes-Perry, Takia S, FNP   40 mg at 01/03/24 0831   PTA Medications: Medications Prior to Admission  Medication Sig  Dispense Refill Last Dose/Taking   aspirin  81 MG EC tablet Take 1 tablet (81 mg total) by mouth daily. Swallow whole. 30 tablet 11    atorvastatin  (LIPITOR) 80 MG tablet Take 1 tablet (80 mg total) by mouth at bedtime. 90 tablet 0    cetirizine  (ZYRTEC ) 10 MG tablet Take 10 mg by mouth daily.      irbesartan  (AVAPRO ) 150 MG tablet Take 1 tablet (150 mg total) by mouth daily. 90 tablet 0    nicotine  (NICODERM CQ  - DOSED IN MG/24 HOURS) 14 mg/24hr patch Place 1 patch (14 mg total) onto the skin daily. 28 patch 0    pantoprazole  (PROTONIX ) 40 MG tablet Take 1 tablet (40 mg total) by mouth daily. 30 tablet 0    Vitamins A & D (VITAMIN A & D) ointment Apply 1 Application topically as needed for dry skin.       Patient Stressors:    Patient Strengths:    Treatment Modalities: Medication Management, Group therapy, Case management,  1 to 1 session with clinician, Psychoeducation, Recreational therapy.   Physician Treatment Plan for Primary Diagnosis: Schizoaffective disorder, depressive type (HCC) Long Term Goal(s):     Short Term Goals:    Medication Management: Evaluate patient's response, side effects, and tolerance of medication regimen.  Therapeutic Interventions: 1 to 1 sessions, Unit Group sessions and Medication administration.  Evaluation of Outcomes: Not Met  Physician Treatment Plan for Secondary Diagnosis: Principal Problem:   Schizoaffective disorder, depressive type (HCC) Active Problems:   Essential hypertension  Long Term Goal(s):     Short Term Goals:       Medication Management: Evaluate patient's response, side effects, and tolerance of medication regimen.  Therapeutic Interventions: 1 to 1 sessions, Unit Group sessions and Medication administration.  Evaluation of Outcomes: Not Met   RN Treatment Plan for Primary Diagnosis: Schizoaffective disorder, depressive type (HCC) Long Term Goal(s): Knowledge of disease and therapeutic regimen to maintain health will  improve  Short Term Goals: Ability to remain free from injury will improve, Ability to verbalize frustration and anger appropriately will improve, Ability to demonstrate self-control, Ability to participate in decision making will improve, Ability to verbalize feelings will improve, Ability to disclose and discuss suicidal ideas, Ability to identify and develop effective coping behaviors will improve, and Compliance with prescribed medications will improve  Medication Management: RN will administer medications as ordered by provider, will assess and evaluate patient's response and provide education to patient for prescribed medication. RN will report any adverse and/or side effects to prescribing provider.  Therapeutic Interventions: 1 on 1 counseling sessions, Psychoeducation, Medication administration, Evaluate responses to treatment, Monitor vital signs and CBGs as ordered, Perform/monitor CIWA, COWS, AIMS and Fall Risk screenings as ordered, Perform wound care treatments as ordered.  Evaluation of Outcomes: Not Met   LCSW Treatment Plan for Primary Diagnosis: Schizoaffective disorder, depressive type (HCC) Long Term Goal(s): Safe transition to appropriate next level of care at discharge, Engage patient in therapeutic group addressing interpersonal concerns.  Short Term Goals: Engage patient in aftercare planning with referrals and resources, Increase social support, Increase ability to appropriately verbalize feelings, Increase emotional regulation, Facilitate acceptance of mental health diagnosis and concerns, Facilitate patient progression through stages of change regarding substance use diagnoses and concerns, Identify triggers associated with mental health/substance abuse issues, and Increase skills for wellness and recovery  Therapeutic Interventions: Assess for all discharge needs, 1 to 1 time with Social worker, Explore available resources and support systems, Assess for adequacy in  community support network, Educate family and significant other(s) on suicide prevention, Complete Psychosocial Assessment, Interpersonal group therapy.  Evaluation of Outcomes: Not Met   Progress in Treatment: Attending groups: Yes. Participating in groups: Yes. Taking medication as prescribed: Yes. Toleration medication: Yes. Family/Significant other contact made: No, will contact:  when given permission. Patient understands diagnosis: Yes. Discussing patient identified problems/goals with staff: Yes. Medical problems stabilized or resolved: Yes. Denies suicidal/homicidal ideation: Yes. Issues/concerns per patient self-inventory: No. Other: none.  New problem(s) identified: No, Describe:  none identified.  New Short Term/Long Term Goal(s): detox, elimination of symptoms of psychosis, medication management for mood stabilization; elimination of SI thoughts; development of comprehensive mental wellness/sobriety plan.  Patient Goals: To get stable enough really to go into treatment.   Discharge Plan or Barriers: CSW will assist pt with development of an appropriate aftercare/discharge plan.   Reason for Continuation of Hospitalization: Depression Hallucinations Homicidal ideation Medication stabilization Suicidal ideation  Estimated Length of Stay: 1-7 days  Last 3 [place] Suicide Severity Risk Score: Flowsheet Row Admission (Current) from 01/02/2024 in [facility] Most recent reading at 01/02/2024  5:00 PM ED to Hosp-Admission (Discharged) from 12/31/2023 in [facility] Most recent reading at 01/01/2024  9:45 PM ED from 12/31/2023 in  [facility] Most recent reading at 12/31/2023  4:06 PM  C-SSRS RISK CATEGORY High Risk High Risk High Risk       Last PHQ 2/9 Scores:    10/25/2021   12:04 PM 10/31/2018    1:00 PM  Depression screen PHQ 2/9  Decreased Interest 1 1  Down, Depressed, Hopeless 1 1  PHQ - 2 Score  2 2  Altered sleeping 2 1  Tired, decreased energy 1 1  Change in appetite 0 1  Feeling bad or failure about yourself  1 0  Trouble concentrating 2 3  Moving slowly or fidgety/restless 1 1  Suicidal thoughts 0 0  PHQ-9 Score 9 9    Scribe for Treatment Team: [name] Fam, LCSW 01/03/2024 9:41 AM

## 2024-01-03 NOTE — Plan of Care (Signed)
   Problem: Education: Goal: Knowledge of [facility] Education information/materials will improve Outcome: Progressing Goal: Emotional status will improve Outcome: Progressing

## 2024-01-03 NOTE — Progress Notes (Signed)
   01/03/24 1445  Spiritual Encounters  Type of Visit Initial  Care provided to: Patient  Referral source Patient request;Chaplain team (PRN Chaplain shared with Chaplain Resident in morning huddle that this patient requested prayer during the evening shift.)  Reason for visit Routine spiritual support (Patient requested prayer.)  OnCall Visit No  Spiritual Framework  Presenting Themes Caregiving needs;Values and beliefs;Impactful experiences and emotions;Significant life change;Meaning/purpose/sources of inspiration  Values/beliefs Patient has a very strong faith.  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  Patient Stress Factors Major life changes;Family relationships;Loss  Family Stress Factors None identified  Goals  Self/Personal Goals Patient wants to be able to be more understanding.  Intervention Outcomes  Outcomes Reduced anxiety  Spiritual Care Plan  Spiritual Care Issues Still Outstanding Chaplain will continue to follow   PRN Chaplain shared with Resident Chaplain that this patient requested prayer during the evening.  Chaplain Resident met with patient while rounding in the [facility] Unit and assessed spiritual needs.  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  Patient was dealing with issues related to grief/loss and the trauma of seeing his friend's health decline over a period of time.  In addition, there are relationship issues with the patient and the family of the deceased.  Chaplain celebrated the fact that the patient recognized that he needed help/support/resources and came to the [facility] Unit for care.  Chaplain prayed with the patient.  Chaplain will follow-up with the patient during the week of 01/07/2024.   Rev. Rana M. Nicholaus, MDiv. Chaplain Resident [facility]

## 2024-01-03 NOTE — B[facility] Suicide Risk Assessment (Signed)
 [facility] Admission Suicide Risk Assessment   Nursing information obtained from:    Demographic factors:  Male, Living alone, Unemployed Current Mental Status:  NA Loss Factors:  Financial problems / change in socioeconomic status Historical Factors:  NA Risk Reduction Factors:  NA  Total Time spent with patient: 2 hours Principal Problem: Schizoaffective disorder, depressive type (HCC) Diagnosis:  Principal Problem:   Schizoaffective disorder, depressive type (HCC) Active Problems:   Essential hypertension  Subjective Data: 63 year old African American Male Patient presents for evaluation of psychiatric symptoms, including auditory and visual hallucinations (AVH), and expresses interest in residential substance abuse treatment. Patient has a history of Schizophrenia, depression, and anxiety. He has a Long history of substance abuse, including daily cocaine use (up to 20 times/day last use was 4 days ago; occasional marijuana use, and 1-2 beers per session. The patient reports a plan to harm himself by drinking Drano. Reports 10 years of sobriety during incarceration (ending May 2019) and relapse immediately upon release. Past experiences of delirium tremens (DTs), with shakes and nausea.Denies suicidal ideation (SI) or homicidal ideation (HI) but endorses auditory and visual hallucinations. Homeless, receives $317/month in disability. Divorced, estranged from his daughter. Family history of schizophrenia (mother, sister, father) and alcohol use (father). Previously prescribed Seroquel , clonidine , trazodone , baclofen , Cogentin , amlodipine , and Artane. Currently only taking amlodipine  for blood pressure, though he has not taken it for over a week.  Continued Clinical Symptoms:  Alcohol Use Disorder Identification Test Final Score (AUDIT): 36 The Alcohol Use Disorders Identification Test, Guidelines for Use in Primary Care, Second Edition.  [employer] [facility] Score between 0-7:  no or  low risk or alcohol related problems. Score between 8-15:  moderate risk of alcohol related problems. Score between 16-19:  high risk of alcohol related problems. Score 20 or above:  warrants further diagnostic evaluation for alcohol dependence and treatment.   CLINICAL FACTORS:   Depression:   Insomnia Alcohol/Substance Abuse/Dependencies Schizophrenia:   Command hallucinatons More than one psychiatric diagnosis Previous Psychiatric Diagnoses and Treatments Medical Diagnoses and Treatments/Surgeries   Musculoskeletal: Strength & Muscle Tone: within normal limits Gait & Station: normal Patient leans: N/A  Psychiatric Specialty Exam:  Presentation  General Appearance:  Disheveled  Eye Contact: Good  Speech: Clear and Coherent; Normal Rate  Speech Volume: Normal  Handedness: Right   Mood and Affect  Mood: Depressed; Euthymic  Affect: Flat   Thought Process  Thought Processes: Goal Directed  Descriptions of Associations:Intact  Orientation:Full (Time, Place and Person)  Thought Content:Logical; Paranoid Ideation  History of Schizophrenia/Schizoaffective disorder:Yes  Duration of Psychotic Symptoms:Greater than six months  Hallucinations:Hallucinations: Auditory; Visual Description of Auditory Hallucinations: CAH Description of Visual Hallucinations: person talking to him to calm down  Ideas of Reference:Paranoia  Suicidal Thoughts:Suicidal Thoughts: Yes, Active SI Active Intent and/or Plan: With Means to Carry Out  Homicidal Thoughts:Homicidal Thoughts: Yes, Active HI Active Intent and/or Plan: With Intent; Without Means to [employer]  Memory: Immediate Fair; Remote Fair; Recent Fair  Judgment: Fair  Insight: Fair   Art Therapist  Concentration: Good  Attention Span: Good  Recall: Good  Fund of Knowledge: Good  Language: Good   Psychomotor Activity  Psychomotor Activity: Psychomotor Activity:  Normal   Assets  Assets: Communication Skills; Desire for Improvement   Sleep  Sleep: Sleep: Poor Number of Hours of Sleep: 4    Physical Exam: Physical Exam Vitals and nursing note reviewed.  Constitutional:      Appearance: Normal appearance.  HENT:     Head: Normocephalic and atraumatic.     Nose: Nose normal.  Pulmonary:     Effort: Pulmonary effort is normal.     Breath sounds: Normal breath sounds.  Musculoskeletal:        General: Normal range of motion.     Cervical back: Normal range of motion.  Skin:    General: Skin is warm and dry.  Neurological:     General: No focal deficit present.     Mental Status: He is alert and oriented to person, place, and time.  Psychiatric:        Attention and Perception: He perceives auditory and visual hallucinations.        Mood and Affect: Affect is flat.        Speech: Speech normal.        Behavior: Behavior normal. Behavior is cooperative.        Thought Content: Thought content is paranoid and delusional. Thought content includes homicidal and suicidal ideation. Thought content includes homicidal and suicidal plan.        Cognition and Memory: Cognition and memory normal.        Judgment: Judgment is impulsive.    Review of Systems  Psychiatric/Behavioral:  Positive for depression, hallucinations, substance abuse and suicidal ideas.   All other systems reviewed and are negative.  Blood pressure (!) 145/92, pulse (!) 57, temperature 98 F (36.7 C), temperature source Oral, resp. rate (!) 22, height 5' 10 (1.778 m), weight 67.1 kg, SpO2 99%. Body mass index is 21.24 kg/m.   COGNITIVE FEATURES THAT CONTRIBUTE TO RISK:  None    SUICIDE RISK:   Moderate:  Frequent suicidal ideation with limited intensity, and duration, some specificity in terms of plans, no associated intent, good self-control, limited dysphoria/symptomatology, some risk factors present, and identifiable protective factors, including available and  accessible social support.  PLAN OF CARE:  Haldol  (Haloperidol ) 2.5 mg BID to manage symptoms of schizophrenia, including auditory and visual hallucinations (AVH). Cogentin  (Benztropine ) 1 mg PRN to manage or prevent extrapyramidal symptoms (EPS) caused by antipsychotics like Haldol . Trazodone  50 mg PRN for insomnia and mood regulation. Give at bedtime if the patient has difficulty sleeping. Melatonin 3 mg HS to regulate sleep-wake cycles and assist with insomnia at bedtime for sleep induction. Lexapro  (Escitalopram ) 10 mg Daily for depression and anxiety management. Hydroxyzine  25 mg QID PRN for anxiety and agitation management. Reinitiate Amlodipine  10 mg daily for blood pressure control. Monitor blood pressure daily during hospitalization. Engage the patient in supportive therapy during inpatient stay to explore emotional triggers and coping mechanisms. Educate on managing schizophrenia symptoms and substance use recovery strategies. Connect with case management to secure transitional housing or shelter placement.  Arrange placement in a residential substance abuse program. Outpatient psychiatry to monitor schizophrenia, depression, and anxiety. Primary care for hypertension management. Provide information on local mental health and substance use hotlines and support groups. I certify that inpatient services furnished can reasonably be expected to improve the patient's condition.   Brad GORMAN Moats, NP 01/03/2024, 1:30 PM

## 2024-01-03 NOTE — Plan of Care (Signed)

## 2024-01-03 NOTE — Progress Notes (Signed)
   01/03/24 1100  Psych Admission Type (Psych Patients Only)  Admission Status Voluntary  Psychosocial Assessment  Patient Complaints Anxiety;Depression;Sleep disturbance;Other (Comment) (hallucinations on and off, per patient; patient reported that he has always dealt with depression and anxiety; first day being here has him anxious.)  Eye Contact Fair  Facial Expression Anxious;Worried  Affect Preoccupied (patient asking for anxiety medication on assessment)  Speech Logical/coherent  Interaction Assertive  Motor Activity Slow  Appearance/Hygiene In scrubs  Behavior Characteristics Cooperative;Appropriate to situation  Mood Preoccupied;Pleasant  Aggressive Behavior  Effect No apparent injury  Thought Process  Coherency WDL  Content WDL  Delusions None reported or observed  Perception WDL  Hallucination None reported or observed  Judgment WDL  Confusion None  Danger to Self  Current suicidal ideation? Denies  Agreement Not to Harm Self Yes  Description of Agreement Verbal  Danger to Others  Danger to Others None reported or observed

## 2024-01-03 NOTE — Group Note (Signed)
 Date:  01/03/2024 Time:  10:24 AM  Group Topic/Focus:  Goals Group:   The focus of this group is to help patients establish daily goals to achieve during treatment and discuss how the patient can incorporate goal setting into their daily lives to aide in recovery.    Participation Level:  Active  Participation Quality:  Appropriate  Affect:  Appropriate  Cognitive:  Appropriate  Insight: Appropriate  Engagement in Group:  Engaged  Modes of Intervention:  Discussion, Education, and Support  Additional Comments:    Deitra Caron Mainland 01/03/2024, 10:24 AM

## 2024-01-04 DIAGNOSIS — F251 Schizoaffective disorder, depressive type: Secondary | ICD-10-CM | POA: Diagnosis not present

## 2024-01-04 LAB — HEMOGLOBIN A1C
Hgb A1c MFr Bld: 5.8 % — ABNORMAL HIGH (ref 4.8–5.6)
Mean Plasma Glucose: 119.76 mg/dL

## 2024-01-04 NOTE — Progress Notes (Signed)
 [facility] MD Progress Note  01/04/2024 7:14 PM Randy Collins  MRN:  [id number] Subjective:  63 year old African American male with a past psychiatric history of schizophrenia presents for follow-up.The patient states he has been taking his scheduled medications as prescribed. Denies current auditory or visual hallucinations (AVH), paranoia, or suicidal/homicidal ideations (SI/HI). Reports mild improvement in mood and symptoms since the last visit but expresses ongoing challenges with social interactions and occasional difficulty sleeping. Principal Problem: Schizoaffective disorder, depressive type (HCC) Diagnosis: Principal Problem:   Schizoaffective disorder, depressive type (HCC) Active Problems:   Essential hypertension   Cocaine abuse (HCC)   ETOH abuse   Marijuana abuse  Total Time spent with patient: 45 minutes  Past Psychiatric History: Schizophrenia    Past Medical History:  Past Medical History:  Diagnosis Date   Arthritis 2009   likely psoriatic arthritis--inflammatory, but not symmetric, left hip and back, right elbow, both knees   Hypertension 2014   Psoriasis 2009   Right lumbar radiculopathy 2019   History reviewed. No pertinent surgical history. Family History:  Family History  Problem Relation Age of Onset   Hypertension Mother    Arthritis Mother    Gout Mother    Hyperlipidemia Mother    Gout Father    Alcohol abuse Father        probable cirrhosis   Heart disease Brother 30       AMI   HIV/AIDS Brother    Cancer Brother        not sure what primary   Family Psychiatric  History: see aove Social History:  Social History   Substance and Sexual Activity  Alcohol Use Yes   Comment: History of abuse.  Occasional beer now.     Social History   Substance and Sexual Activity  Drug Use Yes   Types: Crack cocaine, Marijuana   Comment: history of speed, MJ, Acid.  Last time use was 32    Social History   Socioeconomic History   Marital status:  Single    Spouse name: Not on file   Number of children: 1   Years of education: Not on file   Highest education level: 9th grade  Occupational History   Not on file  Tobacco Use   Smoking status: Every Day    Current packs/day: 1.00    Types: Cigarettes   Smokeless tobacco: Never   Tobacco comments:    [facility]  Vaping Use   Vaping status: Never Used  Substance and Sexual Activity   Alcohol use: Yes    Comment: History of abuse.  Occasional beer now.   Drug use: Yes    Types: Crack cocaine, Marijuana    Comment: history of speed, MJ, Acid.  Last time use was 1999   Sexual activity: Not Currently    Comment: retired Has ED  Other Topics Concern   Not on file  Social History Narrative   Not on file   Social Drivers of Health   Financial Resource Strain: Not on file  Food Insecurity: Food Insecurity Present (01/02/2024)   Hunger Vital Sign    Worried About Running Out of Food in the Last Year: Sometimes true    Ran Out of Food in the Last Year: Sometimes true  Transportation Needs: No Transportation Needs (01/02/2024)   PRAPARE - Administrator, Civil Service (Medical): No    Lack of Transportation (Non-Medical): No  Physical Activity: Not on file  Stress:  Stress Concern Present (10/31/2018)   [employer] of Occupational Health - Occupational Stress Questionnaire    Feeling of Stress : To some extent  Social Connections: Unknown (01/02/2024)   Social Connection and Isolation Panel [NHANES]    Frequency of Communication with Friends and Family: Patient declined    Frequency of Social Gatherings with Friends and Family: Patient declined    Attends Religious Services: Patient declined    Database Administrator or Organizations: No    Attends Engineer, Structural: Never    Marital Status: Patient declined  Recent Concern: Social Connections - Socially Isolated (01/01/2024)   Social Connection and Isolation Panel [NHANES]     Frequency of Communication with Friends and Family: Twice a week    Frequency of Social Gatherings with Friends and Family: Twice a week    Attends Religious Services: Never    Database Administrator or Organizations: No    Attends Engineer, Structural: Never    Marital Status: Divorced   Additional Social History:                         Sleep: Good  Appetite:  Good  Current Medications: Current Facility-Administered Medications  Medication Dose Route Frequency Provider Last Rate Last Admin   acetaminophen  (TYLENOL ) tablet 650 mg  650 mg Oral Q6H PRN Starkes-Perry, Takia S, FNP       alum & mag hydroxide-simeth (MAALOX/MYLANTA) 200-200-20 MG/5ML suspension 30 mL  30 mL Oral Q4H PRN Starkes-Perry, Majel RAMAN, FNP       amLODipine  (NORVASC ) tablet 10 mg  10 mg Oral Daily Cox, Amy N, DO   10 mg at 01/04/24 0853   aspirin  EC tablet 81 mg  81 mg Oral Daily Wilkie Majel RAMAN, FNP   81 mg at 01/04/24 0853   atorvastatin  (LIPITOR) tablet 80 mg  80 mg Oral QHS Wilkie Majel RAMAN, FNP   80 mg at 01/03/24 2127   benztropine  (COGENTIN ) tablet 1 mg  1 mg Oral Daily PRN Ailynn Gow S, NP       escitalopram  (LEXAPRO ) tablet 10 mg  10 mg Oral Daily Gigi Onstad S, NP   10 mg at 01/04/24 0853   feeding supplement (ENSURE ENLIVE / ENSURE PLUS) liquid 237 mL  237 mL Oral BID BM Nicholaus Brad RAMAN, NP   237 mL at 01/04/24 0856   haloperidol  (HALDOL ) tablet 2.5 mg  2.5 mg Oral BID Nicholaus Brad RAMAN, NP   2.5 mg at 01/04/24 1711   hydrALAZINE  (APRESOLINE ) tablet 10 mg  10 mg Oral Q6H PRN Cox, Amy N, DO   10 mg at 01/03/24 1748   hydrOXYzine  (ATARAX ) tablet 25 mg  25 mg Oral TID PRN Zacheriah Stumpe S, NP   25 mg at 01/03/24 1040   irbesartan  (AVAPRO ) tablet 150 mg  150 mg Oral Daily Wilkie Majel RAMAN, FNP   150 mg at 01/04/24 9146   loratadine  (CLARITIN ) tablet 10 mg  10 mg Oral Daily Wilkie Majel RAMAN, FNP   10 mg at 01/04/24 9146   magnesium  hydroxide (MILK OF MAGNESIA) suspension  30 mL  30 mL Oral Daily PRN Wilkie Majel RAMAN, FNP       melatonin tablet 5 mg  5 mg Oral QHS Yazhini Mcaulay S, NP   5 mg at 01/03/24 2127   nicotine  (NICODERM CQ  - dosed in mg/24 hours) patch 14 mg  14 mg Transdermal Daily Starkes-Perry, Majel RAMAN, FNP  14 mg at 01/04/24 9146   OLANZapine  (ZYPREXA ) injection 5 mg  5 mg Intramuscular TID PRN Starkes-Perry, Takia S, FNP       OLANZapine  zydis (ZYPREXA ) disintegrating tablet 5 mg  5 mg Oral TID PRN Starkes-Perry, Takia S, FNP       pantoprazole  (PROTONIX ) EC tablet 40 mg  40 mg Oral Daily Starkes-Perry, Takia S, FNP   40 mg at 01/04/24 9146   traZODone  (DESYREL ) tablet 50 mg  50 mg Oral QHS PRN Nicholaus Brad RAMAN, NP        Lab Results:  Results for orders placed or performed during the hospital encounter of 01/02/24 (from the past 48 hours)  Hemoglobin A1c     Status: Abnormal   Collection Time: 01/04/24  6:52 AM  Result Value Ref Range   Hgb A1c MFr Bld 5.8 (H) 4.8 - 5.6 %    Comment: (NOTE) Pre diabetes:          5.7%-6.4%  Diabetes:              >6.4%  Glycemic control for   <7.0% adults with diabetes    Mean Plasma Glucose 119.76 mg/dL    Comment: Performed at [facility], [address], [place], [place] [address]    Blood Alcohol level:  Lab Results  Component Value Date   [facility] <10 12/31/2023   ETH <10 10/20/2021    Metabolic Disorder Labs: Lab Results  Component Value Date   HGBA1C 5.8 (H) 01/04/2024   MPG 119.76 01/04/2024   MPG 120 12/31/2023   No results found for: PROLACTIN Lab Results  Component Value Date   CHOL 174 12/31/2023   TRIG 60 12/31/2023   HDL 73 12/31/2023   CHOLHDL 2.4 12/31/2023   VLDL 12 12/31/2023   LDLCALC 89 12/31/2023   LDLCALC 73 06/29/2022    Physical Findings: AIMS: Facial and Oral Movements Muscles of Facial Expression: None Lips and Perioral Area: None Jaw: None Tongue: None,Extremity Movements Upper (arms, wrists, hands, fingers): None Lower (legs, knees, ankles,  toes): None, Trunk Movements Neck, shoulders, hips: None, Global Judgements Severity of abnormal movements overall : None Incapacitation due to abnormal movements: None Patient's awareness of abnormal movements: No Awareness, Dental Status Current problems with teeth and/or dentures?: Yes Does patient usually wear dentures?: Yes Edentia?: Yes  CIWA:    COWS:     Musculoskeletal: Strength & Muscle Tone: within normal limits Gait & Station: normal Patient leans: N/A  Psychiatric Specialty Exam:  Presentation  General Appearance:  Fairly Groomed  Eye Contact: Minimal  Speech: Clear and Coherent  Speech Volume: Normal  Handedness: Right   Mood and Affect  Mood: Anxious; Irritable  Affect: Flat   Thought Process  Thought Processes: Coherent  Descriptions of Associations:Intact  Orientation:Full (Time, Place and Person)  Thought Content:WDL  History of Schizophrenia/Schizoaffective disorder:No  Duration of Psychotic Symptoms:Greater than six months  Hallucinations:Hallucinations: None Description of Auditory Hallucinations: denies Description of Visual Hallucinations: denies  Ideas of Reference:None  Suicidal Thoughts:Suicidal Thoughts: No SI Active Intent and/or Plan: -- (denies)  Homicidal Thoughts:Homicidal Thoughts: No HI Active Intent and/or Plan: -- (denies)   Sensorium  Memory: Immediate Fair; Remote Fair  Judgment: Fair  Insight: Fair   Art Therapist  Concentration: Fair  Attention Span: Fair  Recall: Fair  Fund of Knowledge: Good  Language: Good   Psychomotor Activity  Psychomotor Activity: Psychomotor Activity: Normal   Assets  Assets: Communication Skills   Sleep  Sleep:  Sleep: Good Number of Hours of Sleep: 7    Physical Exam: Physical Exam Vitals and nursing note reviewed.  Constitutional:      Appearance: Normal appearance.  HENT:     Head: Normocephalic and atraumatic.     Nose:  Nose normal.  Pulmonary:     Effort: Pulmonary effort is normal.  Musculoskeletal:        General: Normal range of motion.     Cervical back: Normal range of motion.  Neurological:     General: No focal deficit present.     Mental Status: He is alert and oriented to person, place, and time. Mental status is at baseline.  Psychiatric:        Attention and Perception: Attention and perception normal.        Mood and Affect: Mood normal. Affect is flat.        Speech: Speech normal.        Behavior: Behavior normal. Behavior is cooperative.        Thought Content: Thought content normal.        Cognition and Memory: Cognition and memory normal.        Judgment: Judgment normal.    Review of Systems  Psychiatric/Behavioral:  The patient is nervous/anxious.   All other systems reviewed and are negative.  Blood pressure 133/87, pulse 73, temperature (!) 97.3 F (36.3 C), resp. rate 16, height 5' 10 (1.778 m), weight 67.1 kg, SpO2 99%. Body mass index is 21.24 kg/m.   Treatment Plan Summary: Daily contact with patient to assess and evaluate symptoms and progress in treatment and Medication management Haldol  (Haloperidol ) 2.5 mg BID to manage symptoms of schizophrenia, including auditory and visual hallucinations (AVH). Cogentin  (Benztropine ) 1 mg PRN to manage or prevent extrapyramidal symptoms (EPS) caused by antipsychotics like Haldol . Trazodone  50 mg PRN for insomnia and mood regulation. Give at bedtime if the patient has difficulty sleeping. Melatonin 3 mg HS to regulate sleep-wake cycles and assist with insomnia at bedtime for sleep induction. Lexapro  (Escitalopram ) 10 mg Daily for depression and anxiety management. Hydroxyzine  25 mg QID PRN for anxiety and agitation management. Reinitiate Amlodipine  10 mg daily for blood pressure control. Monitor blood pressure daily during hospitalization. Engage the patient in supportive therapy during inpatient stay to explore emotional  triggers and coping mechanisms. Educate on managing schizophrenia symptoms and substance use recovery strategies. Connect with case management to secure transitional housing or shelter placement.  Arrange placement in a residential substance abuse program. Outpatient psychiatry to monitor schizophrenia, depression, and anxiety. Primary care for hypertension management. Provide information on local mental health and substance use hotlines and support groups. Brad GORMAN Moats, NP 01/04/2024, 7:14 PM

## 2024-01-04 NOTE — Plan of Care (Signed)
  Problem: Activity: Goal: Interest or engagement in activities will improve Outcome: Progressing Goal: Sleeping patterns will improve Outcome: Progressing   Problem: Education: Goal: Knowledge of [facility] Education information/materials will improve Outcome: Progressing

## 2024-01-04 NOTE — Progress Notes (Signed)
   01/04/24 1800  Psych Admission Type (Psych Patients Only)  Admission Status Voluntary  Psychosocial Assessment  Patient Complaints Anxiety;Depression;Worrying  Eye Contact Brief  Facial Expression Flat  Affect Flat  Speech Logical/coherent  Interaction Minimal  Motor Activity Slow  Appearance/Hygiene In scrubs  Behavior Characteristics Cooperative  Mood Anxious  Thought Process  Coherency WDL  Content WDL  Delusions None reported or observed  Perception WDL  Hallucination None reported or observed  Judgment WDL  Confusion WDL  Danger to Self  Current suicidal ideation? Denies  Agreement Not to Harm Self Yes  Danger to Others  Danger to Others None reported or observed

## 2024-01-04 NOTE — Group Note (Signed)
 Date:  01/04/2024 Time:  3:13 AM  Group Topic/Focus:  Goals Group:   The focus of this group is to help patients establish daily goals to achieve during treatment and discuss how the patient can incorporate goal setting into their daily lives to aide in recovery.    Participation Level:  Active  Participation Quality:  Appropriate  Affect:  Appropriate  Cognitive:  Appropriate  Insight: Appropriate  Engagement in Group:  Engaged  Modes of Intervention:  Discussion   Dwayne Lars 01/04/2024, 3:13 AM

## 2024-01-04 NOTE — Progress Notes (Signed)
   01/03/24 2002  Psych Admission Type (Psych Patients Only)  Admission Status Voluntary  Psychosocial Assessment  Patient Complaints Anxiety  Eye Contact Brief  Facial Expression Sad  Affect Sad  Speech Logical/coherent  Interaction Minimal  Motor Activity Slow  Appearance/Hygiene In scrubs  Behavior Characteristics Cooperative  Mood Preoccupied  Aggressive Behavior  Effect No apparent injury  Thought Process  Coherency WDL  Content WDL  Delusions None reported or observed  Perception WDL  Hallucination None reported or observed  Judgment WDL  Confusion WDL  Danger to Self  Current suicidal ideation? Denies  Agreement Not to Harm Self Yes  Danger to Others  Danger to Others None reported or observed

## 2024-01-05 DIAGNOSIS — F251 Schizoaffective disorder, depressive type: Secondary | ICD-10-CM | POA: Diagnosis not present

## 2024-01-05 NOTE — Progress Notes (Signed)
   01/05/24 0848  Psych Admission Type (Psych Patients Only)  Admission Status Voluntary  Psychosocial Assessment  Patient Complaints Anxiety  Eye Contact Fair  Facial Expression Flat  Affect Anxious  Speech Logical/coherent  Interaction Minimal  Motor Activity Slow  Appearance/Hygiene In scrubs  Behavior Characteristics Cooperative;Anxious  Mood Anxious  Thought Process  Coherency WDL  Content WDL  Delusions None reported or observed  Perception WDL  Hallucination None reported or observed  Judgment Poor  Confusion None  Danger to Self  Current suicidal ideation? Denies  Agreement Not to Harm Self Yes  Description of Agreement Verbal  Danger to Others  Danger to Others None reported or observed

## 2024-01-05 NOTE — Group Note (Unsigned)
 Date:  01/05/2024 Time:  4:59 AM  Group Topic/Focus:  Wrap-Up Group:   The focus of this group is to help patients review their daily goal of treatment and discuss progress on daily workbooks.     Participation Level:  {BHH PARTICIPATION OZCZO:77735}  Participation Quality:  {BHH PARTICIPATION QUALITY:22265}  Affect:  {BHH AFFECT:22266}  Cognitive:  {BHH COGNITIVE:22267}  Insight: {BHH Insight2:20797}  Engagement in Group:  {BHH ENGAGEMENT IN HMNLE:77731}  Modes of Intervention:  {BHH MODES OF INTERVENTION:22269}  Additional Comments:  ***  Prentice Ace 01/05/2024, 4:59 AM

## 2024-01-05 NOTE — Group Note (Unsigned)
 Date:  01/05/2024 Time:  9:06 PM  Group Topic/Focus:  Making Healthy Choices:   The focus of this group is to help patients identify negative/unhealthy choices they were using prior to admission and identify positive/healthier coping strategies to replace them upon discharge.     Participation Level:  {BHH PARTICIPATION OZCZO:77735}  Participation Quality:  {BHH PARTICIPATION QUALITY:22265}  Affect:  {BHH AFFECT:22266}  Cognitive:  {BHH COGNITIVE:22267}  Insight: {BHH Insight2:20797}  Engagement in Group:  {BHH ENGAGEMENT IN HMNLE:77731}  Modes of Intervention:  {BHH MODES OF INTERVENTION:22269}  Additional Comments:  ***  Randy Collins 01/05/2024, 9:06 PM

## 2024-01-05 NOTE — Progress Notes (Signed)
 [facility] MD Progress Note  01/05/2024 6:24 PM Randy Collins  MRN:  [id number] Subjective:  63 year old African American male with a past psychiatric history of schizophrenia presents for follow-up.The patient states he has been taking his scheduled medications as prescribed. Denies current auditory or visual hallucinations (AVH), paranoia, or suicidal/homicidal ideations (SI/HI). Reports mild improvement in mood and symptoms. Principal Problem: Schizoaffective disorder, depressive type (HCC) Diagnosis: Principal Problem:   Schizoaffective disorder, depressive type (HCC) Active Problems:   Essential hypertension   Cocaine abuse (HCC)   ETOH abuse   Marijuana abuse  Total Time spent with patient: 45 minutes  Past Psychiatric History: Schizophrenia   Past Medical History:  Past Medical History:  Diagnosis Date   Arthritis 2009   likely psoriatic arthritis--inflammatory, but not symmetric, left hip and back, right elbow, both knees   Hypertension 2014   Psoriasis 2009   Right lumbar radiculopathy 2019   History reviewed. No pertinent surgical history. Family History:  Family History  Problem Relation Age of Onset   Hypertension Mother    Arthritis Mother    Gout Mother    Hyperlipidemia Mother    Gout Father    Alcohol abuse Father        probable cirrhosis   Heart disease Brother 41       AMI   HIV/AIDS Brother    Cancer Brother        not sure what primary   Family Psychiatric  History: none reported Social History:  Social History   Substance and Sexual Activity  Alcohol Use Yes   Comment: History of abuse.  Occasional beer now.     Social History   Substance and Sexual Activity  Drug Use Yes   Types: Crack cocaine, Marijuana   Comment: history of speed, MJ, Acid.  Last time use was 52    Social History   Socioeconomic History   Marital status: Single    Spouse name: Not on file   Number of children: 1   Years of education: Not on file   Highest  education level: 9th grade  Occupational History   Not on file  Tobacco Use   Smoking status: Every Day    Current packs/day: 1.00    Types: Cigarettes   Smokeless tobacco: Never   Tobacco comments:    [facility]  Vaping Use   Vaping status: Never Used  Substance and Sexual Activity   Alcohol use: Yes    Comment: History of abuse.  Occasional beer now.   Drug use: Yes    Types: Crack cocaine, Marijuana    Comment: history of speed, MJ, Acid.  Last time use was 1999   Sexual activity: Not Currently    Comment: retired Has ED  Other Topics Concern   Not on file  Social History Narrative   Not on file   Social Drivers of Health   Financial Resource Strain: Not on file  Food Insecurity: Food Insecurity Present (01/02/2024)   Hunger Vital Sign    Worried About Running Out of Food in the Last Year: Sometimes true    Ran Out of Food in the Last Year: Sometimes true  Transportation Needs: No Transportation Needs (01/02/2024)   PRAPARE - Administrator, Civil Service (Medical): No    Lack of Transportation (Non-Medical): No  Physical Activity: Not on file  Stress: Stress Concern Present (10/31/2018)   [employer] of Occupational Health - Occupational Stress Questionnaire  Feeling of Stress : To some extent  Social Connections: Unknown (01/02/2024)   Social Connection and Isolation Panel [NHANES]    Frequency of Communication with Friends and Family: Patient declined    Frequency of Social Gatherings with Friends and Family: Patient declined    Attends Religious Services: Patient declined    Database Administrator or Organizations: No    Attends Engineer, Structural: Never    Marital Status: Patient declined  Recent Concern: Social Connections - Socially Isolated (01/01/2024)   Social Connection and Isolation Panel [NHANES]    Frequency of Communication with Friends and Family: Twice a week    Frequency of Social Gatherings with  Friends and Family: Twice a week    Attends Religious Services: Never    Database Administrator or Organizations: No    Attends Engineer, Structural: Never    Marital Status: Divorced   Additional Social History:                         Sleep: Good  Appetite:  Good  Current Medications: Current Facility-Administered Medications  Medication Dose Route Frequency Provider Last Rate Last Admin   acetaminophen  (TYLENOL ) tablet 650 mg  650 mg Oral Q6H PRN Wilkie Majel RAMAN, FNP   650 mg at 01/05/24 0848   alum & mag hydroxide-simeth (MAALOX/MYLANTA) 200-200-20 MG/5ML suspension 30 mL  30 mL Oral Q4H PRN Wilkie Majel RAMAN, FNP       amLODipine  (NORVASC ) tablet 10 mg  10 mg Oral Daily Cox, Amy N, DO   10 mg at 01/05/24 0848   aspirin  EC tablet 81 mg  81 mg Oral Daily Wilkie Majel RAMAN, FNP   81 mg at 01/05/24 0848   atorvastatin  (LIPITOR) tablet 80 mg  80 mg Oral QHS Wilkie Majel RAMAN, FNP   80 mg at 01/04/24 2126   benztropine  (COGENTIN ) tablet 1 mg  1 mg Oral Daily PRN Cherice Glennie S, NP       escitalopram  (LEXAPRO ) tablet 10 mg  10 mg Oral Daily Nicholaus Brad RAMAN, NP   10 mg at 01/05/24 0848   feeding supplement (ENSURE ENLIVE / ENSURE PLUS) liquid 237 mL  237 mL Oral BID BM Nicholaus Brad RAMAN, NP   237 mL at 01/04/24 0856   haloperidol  (HALDOL ) tablet 2.5 mg  2.5 mg Oral BID Nicholaus Brad RAMAN, NP   2.5 mg at 01/05/24 1652   hydrALAZINE  (APRESOLINE ) tablet 10 mg  10 mg Oral Q6H PRN Cox, Amy N, DO   10 mg at 01/03/24 1748   hydrOXYzine  (ATARAX ) tablet 25 mg  25 mg Oral TID PRN Zelda Reames S, NP   25 mg at 01/03/24 1040   irbesartan  (AVAPRO ) tablet 150 mg  150 mg Oral Daily Wilkie Majel RAMAN, FNP   150 mg at 01/05/24 0848   loratadine  (CLARITIN ) tablet 10 mg  10 mg Oral Daily Wilkie Majel RAMAN, FNP   10 mg at 01/05/24 0847   magnesium  hydroxide (MILK OF MAGNESIA) suspension 30 mL  30 mL Oral Daily PRN Wilkie Majel RAMAN, FNP       melatonin tablet 5 mg  5  mg Oral QHS Zhania Shaheen S, NP   5 mg at 01/04/24 2126   nicotine  (NICODERM CQ  - dosed in mg/24 hours) patch 14 mg  14 mg Transdermal Daily Wilkie Majel RAMAN, FNP   14 mg at 01/05/24 0851   OLANZapine  (ZYPREXA ) injection 5 mg  5 mg Intramuscular TID PRN Starkes-Perry, Takia S, FNP       OLANZapine  zydis (ZYPREXA ) disintegrating tablet 5 mg  5 mg Oral TID PRN Starkes-Perry, Takia S, FNP       pantoprazole  (PROTONIX ) EC tablet 40 mg  40 mg Oral Daily Starkes-Perry, Takia S, FNP   40 mg at 01/05/24 0848   traZODone  (DESYREL ) tablet 50 mg  50 mg Oral QHS PRN Nicholaus Brad RAMAN, NP        Lab Results:  Results for orders placed or performed during the hospital encounter of 01/02/24 (from the past 48 hours)  Hemoglobin A1c     Status: Abnormal   Collection Time: 01/04/24  6:52 AM  Result Value Ref Range   Hgb A1c MFr Bld 5.8 (H) 4.8 - 5.6 %    Comment: (NOTE) Pre diabetes:          5.7%-6.4%  Diabetes:              >6.4%  Glycemic control for   <7.0% adults with diabetes    Mean Plasma Glucose 119.76 mg/dL    Comment: Performed at [facility], [address], [place], [place] [address]    Blood Alcohol level:  Lab Results  Component Value Date   [facility] <10 12/31/2023   ETH <10 10/20/2021    Metabolic Disorder Labs: Lab Results  Component Value Date   HGBA1C 5.8 (H) 01/04/2024   MPG 119.76 01/04/2024   MPG 120 12/31/2023   No results found for: PROLACTIN Lab Results  Component Value Date   CHOL 174 12/31/2023   TRIG 60 12/31/2023   HDL 73 12/31/2023   CHOLHDL 2.4 12/31/2023   VLDL 12 12/31/2023   LDLCALC 89 12/31/2023   LDLCALC 73 06/29/2022    Physical Findings: AIMS: Facial and Oral Movements Muscles of Facial Expression: None Lips and Perioral Area: None Jaw: None Tongue: None,Extremity Movements Upper (arms, wrists, hands, fingers): None Lower (legs, knees, ankles, toes): None, Trunk Movements Neck, shoulders, hips: None, Global Judgements Severity of  abnormal movements overall : None Incapacitation due to abnormal movements: None Patient's awareness of abnormal movements: No Awareness, Dental Status Current problems with teeth and/or dentures?: Yes Does patient usually wear dentures?: Yes Edentia?: Yes  CIWA:    COWS:     Musculoskeletal: Strength & Muscle Tone: within normal limits Gait & Station: normal Patient leans: N/A  Psychiatric Specialty Exam:  Presentation  General Appearance: General Appearance:  Fairly Groomed   Eye Contact: Minimal   Speech: Clear and Coherent   Speech Volume: Normal   Handedness: Right     Mood and Affect  Mood: Anxious; Irritable   Affect: Flat     Thought Process  Thought Processes: Coherent   Descriptions of Associations:Intact   Orientation:Full (Time, Place and Person)   Thought Content:WDL   History of Schizophrenia/Schizoaffective disorder:No   Duration of Psychotic Symptoms:Greater than six months   Hallucinations:Hallucinations: None Description of Auditory Hallucinations: denies Description of Visual Hallucinations: denies   Ideas of Reference:None   Suicidal Thoughts:Suicidal Thoughts: No SI Active Intent and/or Plan: -- (denies)   Homicidal Thoughts:Homicidal Thoughts: No HI Active Intent and/or Plan: -- (denies)     Sensorium  Memory: Immediate Fair; Remote Fair   Judgment: Fair   Insight: Fair     Art Therapist  Concentration: Fair   Attention Span: Fair   Recall: Fair   Fund of Knowledge: Good   Language: Good     Psychomotor  Activity  Psychomotor Activity: Psychomotor Activity: Normal      Assets: Communication Skills       Sleep: Sleep: Good Number of Hours of Sleep: 7     Physical Exam: Physical Exam Vitals and nursing note reviewed.  Constitutional:      Appearance: Normal appearance.  HENT:     Head: Normocephalic and atraumatic.     Nose: Nose normal.  Pulmonary:     Effort: Pulmonary  effort is normal.  Musculoskeletal:        General: Normal range of motion.     Cervical back: Normal range of motion.  Neurological:     Mental Status: He is alert.  Psychiatric:        Attention and Perception: Attention and perception normal.        Mood and Affect: Mood is anxious. Affect is flat.        Speech: Speech normal.        Behavior: Behavior normal. Behavior is cooperative.        Thought Content: Thought content normal.        Cognition and Memory: Cognition and memory normal.        Judgment: Judgment normal.   Review of Systems  Psychiatric/Behavioral:  Positive for substance abuse. The patient is nervous/anxious.   All other systems reviewed and are negative.  Blood pressure 122/68, pulse 62, temperature 98.1 F (36.7 C), resp. rate 16, height 5' 10 (1.778 m), weight 67.1 kg, SpO2 98%. Body mass index is 21.24 kg/m.   Treatment Plan Summary: Daily contact with patient to assess and evaluate symptoms and progress in treatment and Medication management Haldol  (Haloperidol ) 2.5 mg BID to manage symptoms of schizophrenia, including auditory and visual hallucinations (AVH). Cogentin  (Benztropine ) 1 mg PRN to manage or prevent extrapyramidal symptoms (EPS) caused by antipsychotics like Haldol . Trazodone  50 mg PRN for insomnia and mood regulation. Give at bedtime if the patient has difficulty sleeping. Melatonin 3 mg HS to regulate sleep-wake cycles and assist with insomnia at bedtime for sleep induction. Lexapro  (Escitalopram ) 10 mg Daily for depression and anxiety management. Hydroxyzine  25 mg QID PRN for anxiety and agitation management. Reinitiate Amlodipine  10 mg daily for blood pressure control. Monitor blood pressure daily during hospitalization. Engage the patient in supportive therapy during inpatient stay to explore emotional triggers and coping mechanisms. Educate on managing schizophrenia symptoms and substance use recovery strategies. Connect with case  management to secure transitional housing or shelter placement.  Arrange placement in a residential substance abuse program. Outpatient psychiatry to monitor schizophrenia, depression, and anxiety. Primary care for hypertension management. Provide information on local mental health and substance use hotlines and support groups. Brad GORMAN Moats, NP 01/05/2024, 6:24 PM

## 2024-01-05 NOTE — Group Note (Signed)
 Date:  01/05/2024 Time:  9:41 PM  Group Topic/Focus:  Making Healthy Choices:   The focus of this group is to help patients identify negative/unhealthy choices they were using prior to admission and identify positive/healthier coping strategies to replace them upon discharge.    Participation Level:  Active  Participation Quality:  Appropriate  Affect:  Appropriate  Cognitive:  Appropriate  Insight: Appropriate  Engagement in Group:  Engaged  Modes of Intervention:  Discussion   Dwayne Lars 01/05/2024, 9:41 PM

## 2024-01-05 NOTE — Plan of Care (Signed)
   Problem: Education: Goal: Emotional status will improve Outcome: Progressing Goal: Mental status will improve Outcome: Progressing Goal: Verbalization of understanding the information provided will improve Outcome: Progressing

## 2024-01-06 NOTE — Group Note (Signed)
 Date:  01/06/2024 Time:  10:44 AM  Group Topic/Focus:  Dimensions of Wellness:   The focus of this group is to introduce the topic of wellness and discuss the role each dimension of wellness plays in total health. Identifying Needs:   The focus of this group is to help patients identify their personal needs that have been historically problematic and identify healthy behaviors to address their needs.    Participation Level:  Did Not Attend   Lurlean Niece 01/06/2024, 10:44 AM

## 2024-01-06 NOTE — Group Note (Signed)
 Recreation Therapy Group Note   Group Topic:Emotion Expression  Group Date: 01/06/2024 Start Time: 1530 End Time: 1615 Facilitators: Celestia Jeoffrey FORBES ARTICE, CTRS Location:  Craft Room  Group Description: Painting a Peaceful Place. Patients and LRT discuss what it means to be "at peace", what it feels like physically and mentally. Pts are given a canvas and watercolor paint to use and encouraged to draw their idea of a peaceful place. Pts and LRT discuss how they use this in their daily life post discharge. Pts are encouraged to take their canvas home with them as a reminder to find their peaceful place whenever they are feeling depressed, anxious, etc.    Goal Area(s) Addressed:  Patient will identify what it means to experience a "peaceful" emotion. Patient will identify a new coping skill.  Patient will express their emotions through art. Patients will increase communication by talking with LRT and peers while in group.   Affect/Mood: Appropriate   Participation Level: Active and Engaged   Participation Quality: Independent   Behavior: Appropriate, Calm, and Cooperative   Speech/Thought Process: Coherent   Insight: Good   Judgement: Good   Modes of Intervention: Art, Exploration, and Guided Discussion   Patient Response to Interventions:  Attentive, Engaged, Interested , and Receptive   Education Outcome:  Acknowledges education   Clinical Observations/Individualized Feedback: Shamar was active in their participation of session activities and group discussion. Pt identified nature as his peaceful place. Pt painted a picture of a a tree with clouds around it. Pt shared that he usually draws but painting was nice. Pt interacted well with LRT and peers duration of session.   Plan: Continue to engage patient in RT group sessions 2-3x/week.   Jeoffrey FORBES Celestia, LRT, CTRS 01/06/2024 5:34 PM

## 2024-01-06 NOTE — Progress Notes (Signed)
 Patient was cooperative with treatment on the shift, he denies SI, HI, & AVH. He was cooperative with medication regime on the shift.

## 2024-01-06 NOTE — Plan of Care (Signed)
  Problem: Education: Goal: Knowledge of [facility] Education information/materials will improve Outcome: Progressing Goal: Emotional status will improve Outcome: Progressing Goal: Mental status will improve Outcome: Progressing Goal: Verbalization of understanding the information provided will improve Outcome: Progressing   Problem: Activity: Goal: Interest or engagement in activities will improve Outcome: Not Met (add Reason) Goal: Sleeping patterns will improve Outcome: Progressing   Problem: Coping: Goal: Ability to verbalize frustrations and anger appropriately will improve Outcome: Progressing   Problem: Safety: Goal: Periods of time without injury will increase Outcome: Progressing

## 2024-01-06 NOTE — Group Note (Signed)
 Recreation Therapy Group Note   Group Topic:Healthy Support Systems  Group Date: 01/06/2024 Start Time: 1010 End Time: 1110 Facilitators: Celestia Jeoffrey BRAVO, LRT, CTRS Location:  Craft Room  Group Description: Straw Bridge. In groups or individually, patients were given 10 plastic drinking straws and an equal length of masking tape. Using the materials provided, patients were instructed to build a free-standing bridge-like structure to suspend an everyday item (ex: deck of cards) off the floor or table surface. All materials were required to be used in secondary school teacher. LRT facilitated post-activity discussion reviewing the importance of having strong and healthy support systems in our lives. LRT discussed how the people in our lives serve as the tape and the deck of cards we placed on top of our straw structure are the stressors we face in daily life. LRT and pts discussed what happens in our life when things get too heavy for us , and we don't have strong supports outside of the hospital. Pt shared 2 of their healthy supports in their life aloud in the group.   Goal Area(s) Addressed:  Patient will identify 2 healthy supports in their life. Patient will identify skills to successfully complete activity. Patient will identify correlation of this activity to life post-discharge.  Patient will build on frustration tolerance skills. Patient will increase team building and communication skills   Affect/Mood: N/A   Participation Level: Did not attend    Clinical Observations/Individualized Feedback: Patient did not attend group.   Plan: Continue to engage patient in RT group sessions 2-3x/week.   Jeoffrey BRAVO Celestia, LRT, CTRS 01/06/2024 12:04 PM

## 2024-01-06 NOTE — Progress Notes (Signed)
   01/06/24 1000  Psych Admission Type (Psych Patients Only)  Admission Status Voluntary  Psychosocial Assessment  Patient Complaints Depression  Eye Contact Fair  Facial Expression Flat  Affect Anxious  Speech Logical/coherent  Interaction Minimal  Motor Activity Slow  Appearance/Hygiene In scrubs  Behavior Characteristics Cooperative  Mood Anxious;Pleasant  Thought Process  Coherency WDL  Content WDL  Delusions None reported or observed  Perception WDL  Hallucination None reported or observed  Judgment Impaired  Confusion None  Danger to Self  Current suicidal ideation? Denies  Agreement Not to Harm Self Yes  Description of Agreement verbal  Danger to Others  Danger to Others None reported or observed

## 2024-01-06 NOTE — Group Note (Signed)
 [facility] LCSW Group Therapy Note    Group Date: 01/06/2024 Start Time: 1330 End Time: 1430  Type of Therapy and Topic:  Group Therapy:  Overcoming Obstacles  Participation Level:  BHH PARTICIPATION LEVEL: Did Not Attend  Mood:  Description of Group:   In this group patients will be encouraged to explore what they see as obstacles to their own wellness and recovery. They will be guided to discuss their thoughts, feelings, and behaviors related to these obstacles. The group will process together ways to cope with barriers, with attention given to specific choices patients can make. Each patient will be challenged to identify changes they are motivated to make in order to overcome their obstacles. This group will be process-oriented, with patients participating in exploration of their own experiences as well as giving and receiving support and challenge from other group members.  Therapeutic Goals: 1. Patient will identify personal and current obstacles as they relate to admission. 2. Patient will identify barriers that currently interfere with their wellness or overcoming obstacles.  3. Patient will identify feelings, thought process and behaviors related to these barriers. 4. Patient will identify two changes they are willing to make to overcome these obstacles:    Summary of Patient Progress Patient declined to attend group.  Therapeutic Modalities:   Cognitive Behavioral Therapy Solution Focused Therapy Motivational Interviewing Relapse Prevention Therapy   Sherryle JINNY Margo, LCSW

## 2024-01-06 NOTE — Progress Notes (Signed)
   01/06/24 2200  Psych Admission Type (Psych Patients Only)  Admission Status Voluntary  Psychosocial Assessment  Patient Complaints Depression  Eye Contact Fair  Facial Expression Flat  Affect Anxious  Speech Logical/coherent  Interaction Minimal  Motor Activity Slow  Appearance/Hygiene In scrubs  Behavior Characteristics Cooperative  Mood Anxious  Thought Process  Coherency WDL  Content WDL  Delusions None reported or observed  Perception WDL  Hallucination None reported or observed  Judgment Impaired  Confusion None  Danger to Self  Current suicidal ideation? Denies (Denies)  Agreement Not to Harm Self Yes  Description of Agreement Verbal  Danger to Others  Danger to Others None reported or observed

## 2024-01-06 NOTE — Progress Notes (Signed)
 [facility] MD Progress Note  01/06/2024 10:10 AM Randy Collins  MRN:  [id number] Subjective:   63 year old African American male with a past psychiatric history of schizophrenia presents for follow-up.The patient states he has been taking his scheduled medications as prescribed. Denies current auditory or visual hallucinations (AVH), paranoia, or suicidal/homicidal ideations (SI/HI). Reports mild improvement in mood and symptoms.  Principal Problem: Schizoaffective disorder, depressive type (HCC) Diagnosis: Principal Problem:   Schizoaffective disorder, depressive type (HCC) Active Problems:   Essential hypertension   Cocaine abuse (HCC)   ETOH abuse   Marijuana abuse  Total Time spent with patient: 45 minutes  Past Psychiatric History: see above  Past Medical History:  Past Medical History:  Diagnosis Date   Arthritis 2009   likely psoriatic arthritis--inflammatory, but not symmetric, left hip and back, right elbow, both knees   Hypertension 2014   Psoriasis 2009   Right lumbar radiculopathy 2019   History reviewed. No pertinent surgical history. Family History:  Family History  Problem Relation Age of Onset   Hypertension Mother    Arthritis Mother    Gout Mother    Hyperlipidemia Mother    Gout Father    Alcohol abuse Father        probable cirrhosis   Heart disease Brother 40       AMI   HIV/AIDS Brother    Cancer Brother        not sure what primary   Family Psychiatric  History: see below Social History:  Social History   Substance and Sexual Activity  Alcohol Use Yes   Comment: History of abuse.  Occasional beer now.     Social History   Substance and Sexual Activity  Drug Use Yes   Types: Crack cocaine, Marijuana   Comment: history of speed, MJ, Acid.  Last time use was 55    Social History   Socioeconomic History   Marital status: Single    Spouse name: Not on file   Number of children: 1   Years of education: Not on file   Highest education  level: 9th grade  Occupational History   Not on file  Tobacco Use   Smoking status: Every Day    Current packs/day: 1.00    Types: Cigarettes   Smokeless tobacco: Never   Tobacco comments:    [facility]  Vaping Use   Vaping status: Never Used  Substance and Sexual Activity   Alcohol use: Yes    Comment: History of abuse.  Occasional beer now.   Drug use: Yes    Types: Crack cocaine, Marijuana    Comment: history of speed, MJ, Acid.  Last time use was 1999   Sexual activity: Not Currently    Comment: retired Has ED  Other Topics Concern   Not on file  Social History Narrative   Not on file   Social Drivers of Health   Financial Resource Strain: Not on file  Food Insecurity: Food Insecurity Present (01/02/2024)   Hunger Vital Sign    Worried About Running Out of Food in the Last Year: Sometimes true    Ran Out of Food in the Last Year: Sometimes true  Transportation Needs: No Transportation Needs (01/02/2024)   PRAPARE - Administrator, Civil Service (Medical): No    Lack of Transportation (Non-Medical): No  Physical Activity: Not on file  Stress: Stress Concern Present (10/31/2018)   [employer] of Occupational Health - Occupational Stress  Questionnaire    Feeling of Stress : To some extent  Social Connections: Unknown (01/02/2024)   Social Connection and Isolation Panel [NHANES]    Frequency of Communication with Friends and Family: Patient declined    Frequency of Social Gatherings with Friends and Family: Patient declined    Attends Religious Services: Patient declined    Database Administrator or Organizations: No    Attends Engineer, Structural: Never    Marital Status: Patient declined  Recent Concern: Social Connections - Socially Isolated (01/01/2024)   Social Connection and Isolation Panel [NHANES]    Frequency of Communication with Friends and Family: Twice a week    Frequency of Social Gatherings with Friends  and Family: Twice a week    Attends Religious Services: Never    Database Administrator or Organizations: No    Attends Engineer, Structural: Never    Marital Status: Divorced   Additional Social History:                         Sleep: Fair  Appetite:  Good  Current Medications: Current Facility-Administered Medications  Medication Dose Route Frequency Provider Last Rate Last Admin   acetaminophen  (TYLENOL ) tablet 650 mg  650 mg Oral Q6H PRN Wilkie Majel RAMAN, FNP   650 mg at 01/05/24 0848   alum & mag hydroxide-simeth (MAALOX/MYLANTA) 200-200-20 MG/5ML suspension 30 mL  30 mL Oral Q4H PRN Wilkie Majel RAMAN, FNP       amLODipine  (NORVASC ) tablet 10 mg  10 mg Oral Daily Cox, Amy N, DO   10 mg at 01/06/24 0848   aspirin  EC tablet 81 mg  81 mg Oral Daily Wilkie Majel RAMAN, FNP   81 mg at 01/06/24 0848   atorvastatin  (LIPITOR) tablet 80 mg  80 mg Oral QHS Wilkie Majel RAMAN, FNP   80 mg at 01/05/24 2110   benztropine  (COGENTIN ) tablet 1 mg  1 mg Oral Daily PRN Minta Fair S, NP       escitalopram  (LEXAPRO ) tablet 10 mg  10 mg Oral Daily Tan Clopper S, NP   10 mg at 01/06/24 0848   feeding supplement (ENSURE ENLIVE / ENSURE PLUS) liquid 237 mL  237 mL Oral BID BM Nicholaus Brad RAMAN, NP   237 mL at 01/04/24 0856   haloperidol  (HALDOL ) tablet 2.5 mg  2.5 mg Oral BID Nicholaus Brad RAMAN, NP   2.5 mg at 01/06/24 0847   hydrALAZINE  (APRESOLINE ) tablet 10 mg  10 mg Oral Q6H PRN Cox, Amy N, DO   10 mg at 01/03/24 1748   hydrOXYzine  (ATARAX ) tablet 25 mg  25 mg Oral TID PRN Jamal Haskin S, NP   25 mg at 01/03/24 1040   irbesartan  (AVAPRO ) tablet 150 mg  150 mg Oral Daily Wilkie Majel RAMAN, FNP   150 mg at 01/06/24 0846   loratadine  (CLARITIN ) tablet 10 mg  10 mg Oral Daily Wilkie Majel RAMAN, FNP   10 mg at 01/06/24 0846   magnesium  hydroxide (MILK OF MAGNESIA) suspension 30 mL  30 mL Oral Daily PRN Wilkie Majel RAMAN, FNP       melatonin tablet 5 mg  5 mg Oral  QHS Devera Englander S, NP   5 mg at 01/05/24 2110   nicotine  (NICODERM CQ  - dosed in mg/24 hours) patch 14 mg  14 mg Transdermal Daily Wilkie Majel RAMAN, FNP   14 mg at 01/06/24 0850   OLANZapine  (ZYPREXA )  injection 5 mg  5 mg Intramuscular TID PRN Starkes-Perry, Takia S, FNP       OLANZapine  zydis (ZYPREXA ) disintegrating tablet 5 mg  5 mg Oral TID PRN Starkes-Perry, Takia S, FNP       pantoprazole  (PROTONIX ) EC tablet 40 mg  40 mg Oral Daily Starkes-Perry, Takia S, FNP   40 mg at 01/06/24 0848   traZODone  (DESYREL ) tablet 50 mg  50 mg Oral QHS PRN Demetrius Mahler S, NP   50 mg at 01/05/24 2110    Lab Results: No results found for this or any previous visit (from the past 48 hours).  Blood Alcohol level:  Lab Results  Component Value Date   ETH <10 12/31/2023   ETH <10 10/20/2021    Metabolic Disorder Labs: Lab Results  Component Value Date   HGBA1C 5.8 (H) 01/04/2024   MPG 119.76 01/04/2024   MPG 120 12/31/2023   No results found for: PROLACTIN Lab Results  Component Value Date   CHOL 174 12/31/2023   TRIG 60 12/31/2023   HDL 73 12/31/2023   CHOLHDL 2.4 12/31/2023   VLDL 12 12/31/2023   LDLCALC 89 12/31/2023   LDLCALC 73 06/29/2022    Physical Findings: AIMS: Facial and Oral Movements Muscles of Facial Expression: None Lips and Perioral Area: None Jaw: None Tongue: None,Extremity Movements Upper (arms, wrists, hands, fingers): None Lower (legs, knees, ankles, toes): None, Trunk Movements Neck, shoulders, hips: None, Global Judgements Severity of abnormal movements overall : None Incapacitation due to abnormal movements: None Patient's awareness of abnormal movements: No Awareness, Dental Status Current problems with teeth and/or dentures?: Yes Does patient usually wear dentures?: Yes Edentia?: Yes  CIWA:    COWS:     Musculoskeletal: Strength & Muscle Tone: within normal limits Gait & Station: normal Patient leans: N/A  Psychiatric Specialty  Exam:  Presentation  General Appearance:  Fairly Groomed  Eye Contact: Minimal  Speech: Clear and Coherent  Speech Volume: Normal  Handedness: Right   Mood and Affect  Mood: Anxious; Irritable  Affect: Flat   Thought Process  Thought Processes: Coherent  Descriptions of Associations:Intact  Orientation:Full (Time, Place and Person)  Thought Content:WDL  History of Schizophrenia/Schizoaffective disorder:No  Duration of Psychotic Symptoms:Greater than six months  Ideas of Reference:None  Suicidal Thoughts:denies Homicidal Thoughts:denies Sensorium  Memory: Immediate Fair; Remote Fair  Judgment: Fair  Insight: Fair   Art Therapist  Concentration: Fair  Attention Span: Fair  Recall: Fair  Fund of Knowledge: Good  Language: Good   Psychomotor Activity  Psychomotor Activity:none reported  Assets  Assets: Communication Skills   Sleep  Sleep:fair  Physical Exam: Physical Exam Vitals and nursing note reviewed.  Constitutional:      Appearance: Normal appearance.  HENT:     Head: Normocephalic and atraumatic.  Cardiovascular:     Rate and Rhythm: Normal rate and regular rhythm.  Pulmonary:     Effort: Pulmonary effort is normal.     Breath sounds: Normal breath sounds.  Musculoskeletal:        General: Normal range of motion.  Neurological:     General: No focal deficit present.     Mental Status: He is alert and oriented to person, place, and time.  Psychiatric:        Attention and Perception: Attention and perception normal.        Mood and Affect: Mood and affect normal.        Speech: Speech normal.  Behavior: Behavior normal. Behavior is cooperative.        Thought Content: Thought content normal.        Cognition and Memory: Cognition and memory normal.        Judgment: Judgment is impulsive.   Review of Systems  All other systems reviewed and are negative.  Blood pressure 122/68, pulse 62,  temperature 98.1 F (36.7 C), resp. rate 16, height 5' 10 (1.778 m), weight 67.1 kg, SpO2 98%. Body mass index is 21.24 kg/m.   Treatment Plan Summary: Daily contact with patient to assess and evaluate symptoms and progress in treatment and Medication management Haldol  (Haloperidol ) 2.5 mg BID to manage symptoms of schizophrenia, including auditory and visual hallucinations (AVH). Cogentin  (Benztropine ) 1 mg PRN to manage or prevent extrapyramidal symptoms (EPS) caused by antipsychotics like Haldol . Trazodone  50 mg PRN for insomnia and mood regulation. Give at bedtime if the patient has difficulty sleeping. Melatonin 3 mg HS to regulate sleep-wake cycles and assist with insomnia at bedtime for sleep induction. Lexapro  (Escitalopram ) 10 mg Daily for depression and anxiety management. Hydroxyzine  25 mg QID PRN for anxiety and agitation management. Reinitiate Amlodipine  10 mg daily for blood pressure control. Monitor blood pressure daily during hospitalization. Engage the patient in supportive therapy during inpatient stay to explore emotional triggers and coping mechanisms. Educate on managing schizophrenia symptoms and substance use recovery strategies. Connect with case management to secure transitional housing or shelter placement.  Arrange placement in a residential substance abuse program. Outpatient psychiatry to monitor schizophrenia, depression, and anxiety. Primary care for hypertension management. Provide information on local mental health and substance use hotlines and support groups.  Brad GORMAN Moats, NP 01/06/2024, 10:10 AM

## 2024-01-06 NOTE — Plan of Care (Signed)
   Problem: Education: Goal: Mental status will improve Outcome: Progressing   Problem: Education: Goal: Verbalization of understanding the information provided will improve Outcome: Progressing

## 2024-01-06 NOTE — Progress Notes (Addendum)
 Assumed care of patient this am, he presented with a sad and depressed affect and mood, he reported having back pain and needs a muscle relaxer. Pt was out in the milieu for lunch and to receive medications otherwise he was isolative to his room. Pt denied thought, plan or intent to harm self or others and he is been maintained on q 15 min rounds.   01/06/24 1000  Psych Admission Type (Psych Patients Only)  Admission Status Voluntary  Psychosocial Assessment  Patient Complaints Depression  Eye Contact Fair  Facial Expression Flat  Affect Anxious  Speech Logical/coherent  Interaction Minimal  Motor Activity Slow  Appearance/Hygiene In scrubs  Behavior Characteristics Cooperative  Mood Anxious;Pleasant  Thought Process  Coherency WDL  Content WDL  Delusions None reported or observed  Perception WDL  Hallucination None reported or observed  Judgment Impaired  Confusion None  Danger to Self  Current suicidal ideation? Denies  Agreement Not to Harm Self Yes  Description of Agreement verbal  Danger to Others  Danger to Others None reported or observed

## 2024-01-06 NOTE — Plan of Care (Signed)
  Problem: Education: Goal: Knowledge of  General Education information/materials will improve 01/06/2024 1553 by Ann Orlean LABOR, RN Outcome: Progressing 01/06/2024 1338 by Ann Orlean LABOR, RN Outcome: Progressing Goal: Emotional status will improve Outcome: Progressing Goal: Mental status will improve 01/06/2024 1553 by Ann Orlean LABOR, RN Outcome: Progressing 01/06/2024 1338 by Ann Orlean LABOR, RN Outcome: Progressing Goal: Verbalization of understanding the information provided will improve 01/06/2024 1553 by Ann Orlean LABOR, RN Outcome: Progressing 01/06/2024 1338 by Ann Orlean LABOR, RN Outcome: Progressing   Problem: Activity: Goal: Interest or engagement in activities will improve 01/06/2024 1553 by Ann Orlean LABOR, RN Outcome: Not Met (add Reason) 01/06/2024 1338 by Ann Orlean LABOR, RN Outcome: Not Met (add Reason) Goal: Sleeping patterns will improve 01/06/2024 1553 by Ann Orlean LABOR, RN Outcome: Progressing 01/06/2024 1338 by Ann Orlean LABOR, RN Outcome: Progressing   Problem: Coping: Goal: Ability to verbalize frustrations and anger appropriately will improve 01/06/2024 1553 by Ann Orlean LABOR, RN Outcome: Progressing 01/06/2024 1338 by Ann Orlean LABOR, RN Outcome: Progressing   Problem: Physical Regulation: Goal: Ability to maintain clinical measurements within normal limits will improve Outcome: Progressing   Problem: Safety: Goal: Periods of time without injury will increase 01/06/2024 1553 by Ann Orlean LABOR, RN Outcome: Progressing 01/06/2024 1338 by Ann Orlean LABOR, RN Outcome: Progressing

## 2024-01-07 ENCOUNTER — Other Ambulatory Visit: Payer: Self-pay

## 2024-01-07 MED ORDER — LORATADINE 10 MG PO TABS
10.0000 mg | ORAL_TABLET | Freq: Every day | ORAL | 0 refills | Status: AC
  Filled 2024-01-07: qty 30, 30d supply, fill #0

## 2024-01-07 MED ORDER — IRBESARTAN 150 MG PO TABS
150.0000 mg | ORAL_TABLET | Freq: Every day | ORAL | 0 refills | Status: AC
  Filled 2024-01-07 – 2024-01-08 (×2): qty 30, 30d supply, fill #0

## 2024-01-07 MED ORDER — AMLODIPINE BESYLATE 10 MG PO TABS
10.0000 mg | ORAL_TABLET | Freq: Every day | ORAL | 0 refills | Status: AC
  Filled 2024-01-07: qty 30, 30d supply, fill #0

## 2024-01-07 MED ORDER — ASPIRIN 81 MG PO TBEC
81.0000 mg | DELAYED_RELEASE_TABLET | Freq: Every day | ORAL | 0 refills | Status: AC
  Filled 2024-01-07: qty 30, 30d supply, fill #0

## 2024-01-07 MED ORDER — PANTOPRAZOLE SODIUM 40 MG PO TBEC
40.0000 mg | DELAYED_RELEASE_TABLET | Freq: Every day | ORAL | 0 refills | Status: AC
  Filled 2024-01-07 – 2024-01-08 (×2): qty 30, 30d supply, fill #0

## 2024-01-07 MED ORDER — ATORVASTATIN CALCIUM 80 MG PO TABS
80.0000 mg | ORAL_TABLET | Freq: Every day | ORAL | 0 refills | Status: AC
  Filled 2024-01-07 – 2024-01-08 (×2): qty 30, 30d supply, fill #0

## 2024-01-07 MED ORDER — HALOPERIDOL 1 MG PO TABS
2.5000 mg | ORAL_TABLET | Freq: Two times a day (BID) | ORAL | 0 refills | Status: AC
  Filled 2024-01-07: qty 15, 3d supply, fill #0

## 2024-01-07 MED ORDER — ESCITALOPRAM OXALATE 10 MG PO TABS
10.0000 mg | ORAL_TABLET | Freq: Every day | ORAL | 0 refills | Status: AC
  Filled 2024-01-07: qty 30, 30d supply, fill #0

## 2024-01-07 MED ORDER — MELATONIN 5 MG PO TABS
5.0000 mg | ORAL_TABLET | Freq: Every day | ORAL | 0 refills | Status: AC
  Filled 2024-01-07: qty 30, 30d supply, fill #0

## 2024-01-07 MED ORDER — BENZTROPINE MESYLATE 1 MG PO TABS
1.0000 mg | ORAL_TABLET | Freq: Every day | ORAL | 0 refills | Status: AC | PRN
  Filled 2024-01-07: qty 30, 30d supply, fill #0

## 2024-01-07 NOTE — Group Note (Signed)
 Recreation Therapy Group Note   Group Topic:Relaxation  Group Date: 01/07/2024 Start Time: 1000 End Time: 1050 Facilitators: Celestia Jeoffrey BRAVO, LRT, CTRS Location:  Craft Room  Group Description: PMR (Progressive Muscle Relaxation). LRT asks patients their current level of stress/anxiety from 1-10, with 10 being the highest. LRT educates patients on what PMR is and the benefits that come from it. Patients are asked to sit with their feet flat on the floor while sitting up and all the way back in their chair, if possible. LRT and pts follow a prompt through a speaker that requires you to tense and release different muscles in their body and focus on their breathing. During session, lights are off and soft music is being played. Pts are given a stress ball to use if needed. At the end of the prompt, LRT asks patients to rank their current levels of stress/anxiety from 1-10, 10 being the highest. LRT provides patients with an education handout on PMR.   Goal Area(s) Addressed:  Patients will be able to describe progressive muscle relaxation.  Patient will practice using relaxation technique. Patient will identify a new coping skill.  Patient will follow multistep directions to reduce anxiety and stress.   Affect/Mood: N/A   Participation Level: Did not attend    Clinical Observations/Individualized Feedback: Patient did not attend group.   Plan: Continue to engage patient in RT group sessions 2-3x/week.   Jeoffrey BRAVO Celestia, LRT, CTRS 01/07/2024 11:33 AM

## 2024-01-07 NOTE — Group Note (Signed)
 Date:  01/07/2024 Time:  10:10 AM  Group Topic/Focus:  Goals Group:   The focus of this group is to help patients establish daily goals to achieve during treatment and discuss how the patient can incorporate goal setting into their daily lives to aide in recovery.    Participation Level:  Did Not Attend   Deitra Clap [facility] 01/07/2024, 10:10 AM

## 2024-01-07 NOTE — Progress Notes (Signed)
 Suicide Risk Assessment  Discharge Assessment    [facility] Discharge Suicide Risk Assessment   Principal Problem: Schizoaffective disorder, depressive type (HCC) Discharge Diagnoses: Principal Problem:   Schizoaffective disorder, depressive type (HCC) Active Problems:   Essential hypertension   Cocaine abuse (HCC)   ETOH abuse   Marijuana abuse   Total Time spent with patient: 45 minutes  Musculoskeletal: Strength & Muscle Tone: within normal limits Gait & Station: normal Patient leans: N/A  Psychiatric Specialty Exam  Presentation  General Appearance:  Appropriate for Environment  Eye Contact: Good  Speech: Clear and Coherent  Speech Volume: Normal  Handedness: Right   Mood and Affect  Mood: Euthymic  Duration of Depression Symptoms: Greater than two weeks  Affect: Appropriate; Full Range   Thought Process  Thought Processes: Coherent  Descriptions of Associations:Intact  Orientation:Full (Time, Place and Person)  Thought Content:Logical  History of Schizophrenia/Schizoaffective disorder:Yes  Duration of Psychotic Symptoms:Greater than six months  Hallucinations:Hallucinations: None  Ideas of Reference:None  Suicidal Thoughts:Suicidal Thoughts: No  Homicidal Thoughts:Homicidal Thoughts: No   Sensorium  Memory: Immediate Good; Recent Good; Remote Good  Judgment: Good  Insight: Good   Executive Functions  Concentration: Good  Attention Span: Good  Recall: Good  Fund of Knowledge: Good  Language: Good   Psychomotor Activity  Psychomotor Activity: Psychomotor Activity: Normal   Assets  Assets: Communication Skills; Desire for Improvement   Sleep  Sleep: Sleep: Good Number of Hours of Sleep: 7   Physical Exam: Physical Exam ROS Blood pressure 131/84, pulse (!) 59, temperature 97.9 F (36.6 C), resp. rate 20, height 5' 10 (1.778 m), weight 67.1 kg, SpO2 100%. Body mass index is 21.24 kg/m.  Mental  Status Per Nursing Assessment::   On Admission:  NA  Demographic Factors:  Male, Low socioeconomic status, and Unemployed  Loss Factors: Financial problems/change in socioeconomic status  Historical Factors: NA  Risk Reduction Factors:   Positive therapeutic relationship and Positive coping skills or problem solving skills  Continued Clinical Symptoms:  More than one psychiatric diagnosis Previous Psychiatric Diagnoses and Treatments  Cognitive Features That Contribute To Risk:  None    Suicide Risk:  Minimal: No identifiable suicidal ideation.  Patients presenting with no risk factors but with morbid ruminations; may be classified as minimal risk based on the severity of the depressive symptoms   Follow-up Information     Services, [facility]. Go to.   Why: Inpatient Treatment. You will recieve all services through the [facility] program. Contact information: [address] [place] [address] [phone]                 Plan Of Care/Follow-up recommendations:  Patient is stable for discharge to [facility] for long-term substance use treatment, on 01/08/2024.  Kathlynn Swofford E Zamier Eggebrecht, NP 01/07/2024, 5:32 PM

## 2024-01-07 NOTE — Group Note (Signed)
 LCSW Group Therapy Note  Group Date: 01/07/2024 Start Time: 1300 End Time: 1405   Type of Therapy and Topic:  Group Therapy - Healthy vs Unhealthy Coping Skills  Participation Level:  Active   Description of Group The focus of this group was to determine what unhealthy coping techniques typically are used by group members and what healthy coping techniques would be helpful in coping with various problems. Patients were guided in becoming aware of the differences between healthy and unhealthy coping techniques. Patients were asked to identify 2-3 healthy coping skills they would like to learn to use more effectively.  Therapeutic Goals Patients learned that coping is what human beings do all day long to deal with various situations in their lives Patients defined and discussed healthy vs unhealthy coping techniques Patients identified their preferred coping techniques and identified whether these were healthy or unhealthy Patients determined 2-3 healthy coping skills they would like to become more familiar with and use more often. Patients provided support and ideas to each other   Summary of Patient Progress:  During group, patient expressed willingly. Patient proved open to input from peers and feedback from CSW. Patient demonstrated proficient insight into the subject matter, was respectful of peers, and participated throughout the entire session.   Therapeutic Modalities Cognitive Behavioral Therapy Motivational Interviewing  Alveta CHRISTELLA Kerns, LCSWA 01/07/2024  3:01 PM

## 2024-01-07 NOTE — Progress Notes (Signed)
 [facility] MD Progress Note  01/07/2024 1405 Randy Collins  MRN:  [id number]  Subjective:  Patient is a 63 year old African American male with a history of schizoaffective disorder and longstanding substance use here for suicidal ideation with plan and auditory and visual hallucinations in the context of non-adherence with medications and daily substance use.  Case discussed with treatment team and chart reviewed. Patient has been visible on the [place], engaged in groups, and actively working on discharge plan of substance use treatment program. He is adherent with medication regimen. Upon evaluation, patient denies thoughts of harming self and others. He is future-oriented and motivated in his recovery. He denies perceptual disturbances. There are no signs or symptoms of acute withdrawal.  Principal Problem: Schizoaffective disorder, depressive type (HCC) Diagnosis:  Principal Problem:   Schizoaffective disorder, depressive type (HCC) Active Problems:   Essential hypertension   Cocaine abuse (HCC)   ETOH abuse   Marijuana abuse  Total Time spent with patient: 30 minutes  Past Psychiatric History: Reported history of schizophrenia, depression, and anxiety. Past inpatient hospitalizations. No recent suicide attempts.  Past Medical History:  Past Medical History:  Diagnosis Date   Arthritis 2009   likely psoriatic arthritis--inflammatory, but not symmetric, left hip and back, right elbow, both knees   Hypertension 2014   Psoriasis 2009   Right lumbar radiculopathy 2019   History reviewed. No pertinent surgical history. Family History:  Family History  Problem Relation Age of Onset   Hypertension Mother    Arthritis Mother    Gout Mother    Hyperlipidemia Mother    Gout Father    Alcohol abuse Father        probable cirrhosis   Heart disease Brother 66       AMI   HIV/AIDS Brother    Cancer Brother        not sure what primary   Family Psychiatric  History: significant for  substance use Social History:  Social History   Substance and Sexual Activity  Alcohol Use Yes   Comment: History of abuse.  Occasional beer now.     Social History   Substance and Sexual Activity  Drug Use Yes   Types: Crack cocaine, Marijuana   Comment: history of speed, MJ, Acid.  Last time use was 76    Social History   Socioeconomic History   Marital status: Single    Spouse name: Not on file   Number of children: 1   Years of education: Not on file   Highest education level: 9th grade  Occupational History   Not on file  Tobacco Use   Smoking status: Every Day    Current packs/day: 1.00    Types: Cigarettes   Smokeless tobacco: Never   Tobacco comments:    [facility]  Vaping Use   Vaping status: Never Used  Substance and Sexual Activity   Alcohol use: Yes    Comment: History of abuse.  Occasional beer now.   Drug use: Yes    Types: Crack cocaine, Marijuana    Comment: history of speed, MJ, Acid.  Last time use was 1999   Sexual activity: Not Currently    Comment: retired Has ED  Other Topics Concern   Not on file  Social History Narrative   Not on file   Social Drivers of Health   Financial Resource Strain: Not on file  Food Insecurity: Food Insecurity Present (01/02/2024)   Hunger Vital Sign  Worried About Programme Researcher, Broadcasting/film/video in the Last Year: Sometimes true    [employer] of Food in the Last Year: Sometimes true  Transportation Needs: No Transportation Needs (01/02/2024)   PRAPARE - Administrator, Civil Service (Medical): No    Lack of Transportation (Non-Medical): No  Physical Activity: Not on file  Stress: Stress Concern Present (10/31/2018)   [employer] of Occupational Health - Occupational Stress Questionnaire    Feeling of Stress : To some extent  Social Connections: Unknown (01/02/2024)   Social Connection and Isolation Panel [NHANES]    Frequency of Communication with Friends and Family: Patient  declined    Frequency of Social Gatherings with Friends and Family: Patient declined    Attends Religious Services: Patient declined    Database Administrator or Organizations: No    Attends Engineer, Structural: Never    Marital Status: Patient declined  Recent Concern: Social Connections - Socially Isolated (01/01/2024)   Social Connection and Isolation Panel [NHANES]    Frequency of Communication with Friends and Family: Twice a week    Frequency of Social Gatherings with Friends and Family: Twice a week    Attends Religious Services: Never    Database Administrator or Organizations: No    Attends Engineer, Structural: Never    Marital Status: Divorced   Additional Social History:                         Sleep: Good  Appetite:  Good  Current Medications: Current Facility-Administered Medications  Medication Dose Route Frequency Provider Last Rate Last Admin   acetaminophen  (TYLENOL ) tablet 650 mg  650 mg Oral Q6H PRN Wilkie Majel RAMAN, FNP   650 mg at 01/05/24 0848   alum & mag hydroxide-simeth (MAALOX/MYLANTA) 200-200-20 MG/5ML suspension 30 mL  30 mL Oral Q4H PRN Wilkie Majel RAMAN, FNP       amLODipine  (NORVASC ) tablet 10 mg  10 mg Oral Daily Cox, Amy N, DO   10 mg at 01/07/24 0902   aspirin  EC tablet 81 mg  81 mg Oral Daily Wilkie Majel RAMAN, FNP   81 mg at 01/07/24 0902   atorvastatin  (LIPITOR) tablet 80 mg  80 mg Oral QHS Wilkie Majel RAMAN, FNP   80 mg at 01/07/24 2128   benztropine  (COGENTIN ) tablet 1 mg  1 mg Oral Daily PRN Davis, Nina S, NP       escitalopram  (LEXAPRO ) tablet 10 mg  10 mg Oral Daily Nicholaus Brad RAMAN, NP   10 mg at 01/07/24 0902   feeding supplement (ENSURE ENLIVE / ENSURE PLUS) liquid 237 mL  237 mL Oral BID BM Nicholaus Brad RAMAN, NP   237 mL at 01/07/24 9094   haloperidol  (HALDOL ) tablet 2.5 mg  2.5 mg Oral BID Nicholaus Brad RAMAN, NP   2.5 mg at 01/07/24 1720   hydrOXYzine  (ATARAX ) tablet 25 mg  25 mg Oral TID PRN Davis,  Nina S, NP   25 mg at 01/03/24 1040   irbesartan  (AVAPRO ) tablet 150 mg  150 mg Oral Daily Wilkie Majel RAMAN, FNP   150 mg at 01/07/24 0902   loratadine  (CLARITIN ) tablet 10 mg  10 mg Oral Daily Wilkie Majel RAMAN, FNP   10 mg at 01/07/24 9097   magnesium  hydroxide (MILK OF MAGNESIA) suspension 30 mL  30 mL Oral Daily PRN Starkes-Perry, Takia S, FNP       melatonin tablet  5 mg  5 mg Oral QHS Davis, Nina S, NP   5 mg at 01/07/24 2128   nicotine  (NICODERM CQ  - dosed in mg/24 hours) patch 14 mg  14 mg Transdermal Daily Wilkie Majel RAMAN, FNP   14 mg at 01/07/24 9097   OLANZapine  (ZYPREXA ) injection 5 mg  5 mg Intramuscular TID PRN Wilkie Majel RAMAN, FNP       OLANZapine  zydis (ZYPREXA ) disintegrating tablet 5 mg  5 mg Oral TID PRN Starkes-Perry, Takia S, FNP       pantoprazole  (PROTONIX ) EC tablet 40 mg  40 mg Oral Daily Starkes-Perry, Takia S, FNP   40 mg at 01/07/24 9097   traZODone  (DESYREL ) tablet 50 mg  50 mg Oral QHS PRN Davis, Nina S, NP   50 mg at 01/06/24 2201    Lab Results: No results found for this or any previous visit (from the past 48 hours).  Blood Alcohol level:  Lab Results  Component Value Date   ETH <10 12/31/2023   ETH <10 10/20/2021    Metabolic Disorder Labs: Lab Results  Component Value Date   HGBA1C 5.8 (H) 01/04/2024   MPG 119.76 01/04/2024   MPG 120 12/31/2023   No results found for: PROLACTIN Lab Results  Component Value Date   CHOL 174 12/31/2023   TRIG 60 12/31/2023   HDL 73 12/31/2023   CHOLHDL 2.4 12/31/2023   VLDL 12 12/31/2023   LDLCALC 89 12/31/2023   LDLCALC 73 06/29/2022    Physical Findings: AIMS:  Facial and Oral Movements Muscles of Facial Expression: None Lips and Perioral Area: None Jaw: None Tongue: None , Extremity Movements Upper (arms, wrists, hands, fingers): None Lower (legs, knees, ankles, toes): None ,  Trunk Movements Neck, shoulders, hips: None ,  Global Judgements Severity of abnormal  movements overall : None Incapacitation due to abnormal movements: None Patient's awareness of abnormal movements: No Awareness ,  Dental Status Current problems with teeth and/or dentures?: Yes Does patient usually wear dentures?: Yes Edentia?: Yes  CIWA:    COWS:     Musculoskeletal: Strength & Muscle Tone: within normal limits Gait & Station: normal Patient leans: N/A  Psychiatric Specialty Exam:  Presentation  General Appearance:  Appropriate for Environment  Eye Contact: Good  Speech: Clear and Coherent  Speech Volume: Normal  Handedness: Right   Mood and Affect  Mood: Euthymic  Affect: Appropriate; Full Range   Thought Process  Thought Processes: Coherent  Descriptions of Associations: Intact  Orientation: Full (Time, Place and Person)  Thought Content: Logical  History of Schizophrenia/Schizoaffective disorder: Yes  Duration of Psychotic Symptoms: Greater than six months  Hallucinations: Hallucinations: None  Ideas of Reference: None  Suicidal Thoughts: Suicidal Thoughts: No  Homicidal Thoughts: Homicidal Thoughts: No   Sensorium  Memory: Immediate Good; Recent Good; Remote Good  Judgment: Good  Insight: Good   Executive Functions  Concentration: Good  Attention Span: Good  Recall: Good  Fund of Knowledge: Good  Language: Good   Psychomotor Activity  Psychomotor Activity: Psychomotor Activity: Normal   Assets  Assets: Communication Skills; Desire for Improvement   Sleep  Sleep: Sleep: Good Number of Hours of Sleep: 7    Physical Exam: Physical Exam Vitals and nursing note reviewed.  Constitutional:      General: He is not in acute distress.    Appearance: Normal appearance.  Eyes:     Extraocular Movements: Extraocular movements intact.  Musculoskeletal:        General: Normal  range of motion.     Cervical back: Normal range of motion.  Neurological:     Mental Status: He is  alert and oriented to person, place, and time.  Psychiatric:        Mood and Affect: Mood normal.        Behavior: Behavior normal.        Thought Content: Thought content normal.        Judgment: Judgment normal.    Review of Systems  Psychiatric/Behavioral:  Negative for depression, hallucinations and suicidal ideas. The patient is not nervous/anxious and does not have insomnia.   All other systems reviewed and are negative.  Blood pressure 131/84, pulse (!) 59, temperature 97.9 F (36.6 C), resp. rate 20, height 5' 10 (1.778 m), weight 67.1 kg, SpO2 100%. Body mass index is 21.24 kg/m.   Treatment Plan Summary: Patient has been accepted to [facility] for residential substance use treatment program. There are no imminent safety concerns at this time. There is no evidence of acute psychosis and he has consistently denied thoughts of harming self and others. Patient to present for admission to [facility] by 9am tomorrow morning (01/08/24). Will continue current medication regimen. 30-day supply of medications ordered from [facility]. Patient is psychiatrically stable for discharge to [facility] on 01/08/24. Patient and treatment team in agreement with plan.  Galen Malkowski E Keigan Tafoya, NP 01/07/2024, 9:48 PM

## 2024-01-07 NOTE — Group Note (Signed)
 Date:  01/07/2024 Time:  12:21 AM  Group Topic/Focus:  Wrap-Up Group:   The focus of this group is to help patients review their daily goal of treatment and discuss progress on daily workbooks.    Participation Level:  Did Not Attend  Participation Quality:      Affect:      Cognitive:      Insight: None  Engagement in Group:  None  Modes of Intervention:      Additional Comments:    Tommas CHRISTELLA Bunker 01/07/2024, 12:21 AM

## 2024-01-07 NOTE — BHH Counselor (Signed)
 CSW reached out to [facility] on patient's behalf at [phone] and spoke with Ms. Rosaline, intake coordinator.   Michelle sent referral form to be completed by CSW as well as information about program. Rosaline verified patient's insurance as [employer].   Rosaline requested documentation be sent on patient's behalf. CSW faxed requested documentation to [phone].   Rosaline explained that after verifying information, patient could be accepted and able to come as soon as tomorrow or within the week.   CSW gave patient [facility] pamphlet sent to her and encouraged patient to read up more on the program. CSW communicated follow up plans to patient. Patient in agreement.   NP and team notified.   Reneshia Zuccaro, MSW, LCSWA 01/07/2024 11:14 AM

## 2024-01-07 NOTE — BHH Counselor (Signed)
 Referral sent to [facility] and [facility] on patient's behalf.    CSW team to continue to assess.    Reymundo Poll, MSW, LCSWA 01/07/2024 10:28 AM

## 2024-01-07 NOTE — Group Note (Signed)
 Date:  01/07/2024 Time:  8:50 PM  Group Topic/Focus:  Wrap-Up Group:   The focus of this group is to help patients review their daily goal of treatment and discuss progress on daily workbooks.    Participation Level:  Minimal  Participation Quality:  Appropriate and Attentive  Affect:  Appropriate  Cognitive:  Alert and Appropriate  Insight: Appropriate  Engagement in Group:  Limited  Modes of Intervention:  Discussion  Additional Comments:     Maglione,Akesha Uresti E 01/07/2024, 8:50 PM

## 2024-01-07 NOTE — BHH Counselor (Signed)
 CSW received conformation from [employer], intake coordinator with [facility] that referral has been received.   Rosaline to touch base when there is a change in application status.   CSW team to continue to assess.     Venetta Knee, MSW, LCSWA 01/07/2024 11:32 AM

## 2024-01-07 NOTE — Progress Notes (Signed)
   01/07/24 1700  Psych Admission Type (Psych Patients Only)  Admission Status Voluntary  Psychosocial Assessment  Patient Complaints Depression  Eye Contact Fair  Affect Anxious  Speech Logical/coherent  Interaction Minimal  Motor Activity Slow  Appearance/Hygiene In scrubs  Behavior Characteristics Cooperative  Mood Anxious  Thought Process  Coherency WDL  Content WDL  Delusions None reported or observed  Perception WDL  Hallucination None reported or observed  Judgment Impaired  Confusion None  Danger to Self  Current suicidal ideation? Denies  Agreement Not to Harm Self Yes  Description of Agreement verbal  Danger to Others  Danger to Others None reported or observed   Discharge medications picked up from pharmacy and placed in the med room.

## 2024-01-07 NOTE — Group Note (Signed)
 Recreation Therapy Group Note   Group Topic:Health and Wellness  Group Date: 01/07/2024 Start Time: 1530 End Time: 1600 Facilitators: Celestia Jeoffrey BRAVO, LRT, CTRS Location:  Dayroom  Group Description: Seated Exercise. LRT discussed the mental and physical benefits of exercise. LRT and group discussed how physical activity can be used as a coping skill. Pt's and LRT followed along to an exercise video on the TV screen that provided a visual representation and audio description of every exercise performed. Pt's encouraged to listen to their bodies and stop at any time if they experience feelings of discomfort or pain. Pts were encouraged to drink water and stay hydrated.   Goal Area(s) Addressed: Patient will learn benefits of physical activity. Patient will identify exercise as a coping skill.  Patient will follow multistep directions. Patient will try a new leisure interest.    Affect/Mood: N/A   Participation Level: Did not attend    Clinical Observations/Individualized Feedback: Patient did not attend group.   Plan: Continue to engage patient in RT group sessions 2-3x/week.   Jeoffrey BRAVO Celestia, LRT, CTRS 01/07/2024 5:31 PM

## 2024-01-08 ENCOUNTER — Other Ambulatory Visit: Payer: Self-pay

## 2024-01-08 NOTE — Progress Notes (Signed)
 Patient denies SI,HI,AVH and pain. Patient can't wait to get dc tmr. Took pm medications. Seen in the day room talking to other patients.  01/08/24 0000  Psych Admission Type (Psych Patients Only)  Admission Status Voluntary  Psychosocial Assessment  Patient Complaints Depression  Eye Contact Fair  Facial Expression Anxious  Affect Anxious  Speech Logical/coherent  Interaction Minimal  Motor Activity Slow  Appearance/Hygiene In scrubs  Behavior Characteristics Cooperative  Mood Anxious  Aggressive Behavior  Effect No apparent injury  Thought Process  Coherency WDL  Content WDL  Delusions None reported or observed  Perception WDL  Hallucination None reported or observed  Judgment WDL  Confusion WDL  Danger to Self  Current suicidal ideation? Denies  Agreement Not to Harm Self Yes  Description of Agreement  (verbal)  Danger to Others  Danger to Others None reported or observed

## 2024-01-08 NOTE — Plan of Care (Signed)
  Problem: Activity: Goal: Interest or engagement in activities will improve Outcome: Progressing Goal: Sleeping patterns will improve Outcome: Progressing   Problem: Education: Goal: Knowledge of [facility] Education information/materials will improve Outcome: Progressing

## 2024-01-08 NOTE — Progress Notes (Signed)
   01/08/24 0818  Psych Admission Type (Psych Patients Only)  Admission Status Voluntary  Psychosocial Assessment  Patient Complaints None  Eye Contact Fair  Facial Expression Flat  Affect Flat  Speech Logical/coherent  Interaction Minimal  Motor Activity Slow  Appearance/Hygiene In scrubs  Behavior Characteristics Cooperative  Mood Pleasant;Euthymic  Thought Process  Coherency WDL  Content WDL  Delusions None reported or observed  Perception WDL  Hallucination None reported or observed  Judgment WDL  Confusion WDL  Danger to Self  Current suicidal ideation? Denies  Agreement Not to Harm Self Yes  Description of Agreement verbal  Danger to Others  Danger to Others None reported or observed   Patient discharged via taxi to [facility]. All discharge instructions given to patient with discharge medications.

## 2024-01-12 NOTE — Discharge Summary (Signed)
Physician Discharge Summary Note  Patient:  Randy Collins is an 64 y.o., male MRN:  [id number] DOB:  Apr 29, 1961 Patient phone:  [phone] (home)  Patient address:   [address] [place] [address],  Total Time spent with patient: 45 minutes  Date of Admission:  01/02/2024 Date of Discharge: 01/08/2024  Reason for Admission:  Acute suicidal ideation with plan, hallucinations, polysubstance use  Principal Problem: Schizoaffective disorder, depressive type [facility]) Discharge Diagnoses: Principal Problem:   Schizoaffective disorder, depressive type (HCC) Active Problems:   Essential hypertension   Cocaine abuse (HCC)   ETOH abuse   Marijuana abuse   Past Psychiatric History: schizophrenia, depression, anxiety, longstanding substance use  Past Medical History:  Past Medical History:  Diagnosis Date   Arthritis 2009   likely psoriatic arthritis--inflammatory, but not symmetric, left hip and back, right elbow, both knees   Hypertension 2014   Psoriasis 2009   Right lumbar radiculopathy 2019   History reviewed. No pertinent surgical history. Family History:  Family History  Problem Relation Age of Onset   Hypertension Mother    Arthritis Mother    Gout Mother    Hyperlipidemia Mother    Gout Father    Alcohol abuse Father        probable cirrhosis   Heart disease Brother 49       AMI   HIV/AIDS Brother    Cancer Brother        not sure what primary   Family Psychiatric  History: The patient reports a family history of schizophrenia (mother, sister, father) and alcohol use disorder (father).   Social History:  Social History   Substance and Sexual Activity  Alcohol Use Yes   Comment: History of abuse.  Occasional beer now.     Social History   Substance and Sexual Activity  Drug Use Yes   Types: "Crack" cocaine, Marijuana   Comment: history of speed, MJ, Acid.  Last time use was 15    Social History   Socioeconomic History   Marital status:  Single    Spouse name: Not on file   Number of children: 1   Years of education: Not on file   Highest education level: 9th grade  Occupational History   Not on file  Tobacco Use   Smoking status: Every Day    Current packs/day: 1.00    Types: Cigarettes   Smokeless tobacco: Never   Tobacco comments:    [facility]  Vaping Use   Vaping status: Never Used  Substance and Sexual Activity   Alcohol use: Yes    Comment: History of abuse.  Occasional beer now.   Drug use: Yes    Types: "Crack" cocaine, Marijuana    Comment: history of speed, MJ, Acid.  Last time use was 1999   Sexual activity: Not Currently    Comment: "retired" Has ED  Other Topics Concern   Not on file  Social History Narrative   Not on file   Social Drivers of Health   Financial Resource Strain: Not on file  Food Insecurity: Food Insecurity Present (01/02/2024)   Hunger Vital Sign    Worried About Running Out of Food in the Last Year: Sometimes true    Ran Out of Food in the Last Year: Sometimes true  Transportation Needs: No Transportation Needs (01/02/2024)   PRAPARE - Administrator, Civil Service (Medical): No    Lack of Transportation (Non-Medical): No  Physical Activity: Not on file  Stress: Stress Concern Present (10/31/2018)   [employer] of Occupational Health - Occupational Stress Questionnaire    Feeling of Stress : To some extent  Social Connections: Unknown (01/02/2024)   Social Connection and Isolation Panel [NHANES]    Frequency of Communication with Friends and Family: Patient declined    Frequency of Social Gatherings with Friends and Family: Patient declined    Attends Religious Services: Patient declined    Database administrator or Organizations: No    Attends Engineer, structural: Never    Marital Status: Patient declined  Recent Concern: Social Connections - Socially Isolated (01/01/2024)   Social Connection and Isolation Panel [NHANES]     Frequency of Communication with Friends and Family: Twice a week    Frequency of Social Gatherings with Friends and Family: Twice a week    Attends Religious Services: Never    Database administrator or Organizations: No    Attends Banker Meetings: Never    Marital Status: Divorced    Hospital Course:  The patient was admitted on 01/02/2024 for acute suicidal ideation with plan, hallucinations, and polysubstance use. Upon admission, the patient exhibited depressed mood, delusions, command auditory hallucinations, and symptoms of acute alcohol withdrawal.  During hospitalization, the patient was placed on Q15 minute monitoring, detox protocol, suicide precautions, seizure precautions. He received treatment including medication management, substance detoxification, group therapy, recreation therapy, and case management The patient  was compliant with treatment.  Pharmacologic management included initiation of Haldol for psychosis and Lexapro for mood and anxiety. He was offered comfort medications for detox and PRN medications for anxiety and insomnia. The patient showed positive response to medications including stabilization of mood and psychosis. He denied side effects.  Throughout the hospital course, the patient's mental status [describe changes in symptoms, cognitive function, insight, and judgment]. The patient demonstrated progress in coping skills, peer interactions, and motivation for recovery.  By discharge, the patient exhibited stable mood, improved thought processes, and resolution of acute symptoms. He was deemed appropriate for discharge to [facility] substance use treatment program. The patient was educated on medication adherence, relapse prevention, and crisis management. Individualized safety plan discussed.   Physical Findings: AIMS: Facial and Oral Movements Muscles of Facial Expression: None Lips and Perioral Area: None Jaw: None Tongue:  None,Extremity Movements Upper (arms, wrists, hands, fingers): None Lower (legs, knees, ankles, toes): None, Trunk Movements Neck, shoulders, hips: None, Global Judgements Severity of abnormal movements overall : None Incapacitation due to abnormal movements: None Patient's awareness of abnormal movements: No Awareness, Dental Status Current problems with teeth and/or dentures?: Yes Does patient usually wear dentures?: Yes [place]?: Yes  CIWA:    COWS:     Musculoskeletal: Strength & Muscle Tone: within normal limits Gait & Station: normal Patient leans: N/A   Psychiatric Specialty Exam:  Presentation  General Appearance:  Appropriate for Environment  Eye Contact: Good  Speech: Clear and Coherent  Speech Volume: Normal  Handedness: Right   Mood and Affect  Mood: Euthymic  Affect: Appropriate; Full Range   Thought Process  Thought Processes: Coherent  Descriptions of Associations:Intact  Orientation:Full (Time, Place and Person)  Thought Content:Logical  History of Schizophrenia/Schizoaffective disorder:Yes  Duration of Psychotic Symptoms:Greater than six months  Hallucinations:None Ideas of Reference:None  Suicidal Thoughts:Denies Homicidal Thoughts:Denies  Sensorium  Memory: Immediate Good; Recent Good; Remote Good  Judgment: Good  Insight: Good   Executive Functions  Concentration: Good  Attention  Span: Good  Recall: Good  Fund of Knowledge: Good  Language: Good   Psychomotor Activity  Psychomotor Activity:No data recorded  Assets  Assets: Communication Skills; Desire for Improvement   Sleep  Sleep:No data recorded   Physical Exam: Physical Exam Vitals and nursing note reviewed.  Constitutional:      Appearance: Normal appearance.  Eyes:     Extraocular Movements: Extraocular movements intact.  Musculoskeletal:        General: Normal range of motion.     Cervical back: Normal range of motion.   Skin:    General: Skin is warm and dry.  Neurological:     Mental Status: He is alert.  Psychiatric:        Mood and Affect: Mood normal.        Behavior: Behavior normal.        Thought Content: Thought content normal.        Judgment: Judgment normal.    Review of Systems  Psychiatric/Behavioral:  Negative for depression, hallucinations and suicidal ideas. The patient is not nervous/anxious.   All other systems reviewed and are negative.  Blood pressure 137/81, pulse 60, temperature 97.9 F (36.6 C), temperature source Oral, resp. rate 18, height 5\' 10"  (1.778 m), weight 67.1 kg, SpO2 99%. Body mass index is 21.24 kg/m.   Social History   Tobacco Use  Smoking Status Every Day   Current packs/day: 1.00   Types: Cigarettes  Smokeless Tobacco Never  Tobacco Comments   [facility]   Tobacco Cessation:  A prescription for an FDA-approved tobacco cessation medication was offered at discharge and the patient refused   Blood Alcohol level:  Lab Results  Component Value Date   [facility] <10 12/31/2023   ETH <10 10/20/2021    Metabolic Disorder Labs:  Lab Results  Component Value Date   HGBA1C 5.8 (H) 01/04/2024   MPG 119.76 01/04/2024   MPG 120 12/31/2023   No results found for: "PROLACTIN" Lab Results  Component Value Date   CHOL 174 12/31/2023   TRIG 60 12/31/2023   HDL 73 12/31/2023   CHOLHDL 2.4 12/31/2023   VLDL 12 12/31/2023   LDLCALC 89 12/31/2023   LDLCALC 73 06/29/2022    See Psychiatric Specialty Exam and Suicide Risk Assessment completed by Attending Physician prior to discharge.  Discharge destination:  [facility]  Is patient on multiple antipsychotic therapies at discharge:  No   Has Patient had three or more failed trials of antipsychotic monotherapy by history:  No  Recommended Plan for Multiple Antipsychotic Therapies: NA  Discharge Instructions     Diet - low sodium heart healthy   Complete by: As directed     Increase activity slowly   Complete by: As directed       Allergies as of 01/08/2024   No Known Allergies      Medication List     STOP taking these medications    cetirizine 10 MG tablet Commonly known as: ZYRTEC Replaced by: loratadine 10 MG tablet   nicotine 14 mg/24hr patch Commonly known as: NICODERM CQ - dosed in mg/24 hours   vitamin A & D ointment       TAKE these medications      Indication  amLODipine 10 MG tablet Commonly known as: NORVASC Take 1 tablet (10 mg total) by mouth daily.  Indication: High Blood Pressure   aspirin EC 81 MG tablet Take 1 tablet (81 mg total) by mouth daily. Swallow  whole.  Indication: Disease involving Lipid Deposits in the Arteries   atorvastatin 80 MG tablet Commonly known as: LIPITOR Take 1 tablet (80 mg total) by mouth at bedtime.  Indication: High Amount of Fats in the Blood   benztropine 1 MG tablet Commonly known as: COGENTIN Take 1 tablet (1 mg total) by mouth daily as needed for tremors.  Indication: Extrapyramidal Reaction caused by Medications   escitalopram 10 MG tablet Commonly known as: LEXAPRO Take 1 tablet (10 mg total) by mouth daily.  Indication: Major Depressive Disorder   haloperidol 1 MG tablet Commonly known as: HALDOL Take 2.5 tablets (2.5 mg total) by mouth 2 (two) times daily.  Indication: Psychosis   irbesartan 150 MG tablet Commonly known as: AVAPRO Take 1 tablet (150 mg total) by mouth daily.  Indication: High Blood Pressure   loratadine 10 MG tablet Commonly known as: CLARITIN Take 1 tablet (10 mg total) by mouth daily. Replaces: cetirizine 10 MG tablet  Indication: Hayfever   melatonin 5 MG Tabs Take 1 tablet (5 mg total) by mouth at bedtime.  Indication: Trouble Sleeping   pantoprazole 40 MG tablet Commonly known as: PROTONIX Take 1 tablet (40 mg total) by mouth daily.  Indication: Heartburn        Follow-up Information     Services, [facility]. Go to.    Why: Inpatient Treatment. You will recieve all services through the [facility] program. Contact information: [id number] [place] [address] [phone]                 Follow-up recommendations:     - It is recommended to the patient to continue psychiatric medications as prescribed, after discharge from the hospital.   - It is recommended to the patient to follow up with your outpatient psychiatric provider and PCP. - It was discussed with the patient, the impact of alcohol, drugs, tobacco have been there overall psychiatric and medical wellbeing, and total abstinence from substance use was recommended the patient. - Prescriptions provided or sent directly to preferred pharmacy at discharge. Patient agreeable to plan. Given opportunity to ask questions. Appears to feel comfortable with discharge.   - In the event of worsening symptoms, the patient is instructed to call the crisis hotline, 911 and or go to the nearest ED for appropriate evaluation and treatment of symptoms. To follow-up with primary care provider for other medical issues, concerns and or health care needs - Patient was discharged to [facility]  Signed: Prerna Harold Robyne Peers, NP

## 2024-02-03 ENCOUNTER — Ambulatory Visit (HOSPITAL_BASED_OUTPATIENT_CLINIC_OR_DEPARTMENT_OTHER): Payer: MEDICAID | Admitting: Family

## 2024-02-03 NOTE — Progress Notes (Deleted)
  Cardiology Office Note:  .   Date:  02/03/2024  ID:  Randy Collins, DOB 07/19/1961, MRN [id number] PCP: Fleet Contras, MD  [facility] Providers Cardiologist:  None { Click to update primary MD,subspecialty MD or APP then REFRESH:1}   History of Present Illness: .   Randy Collins is a 63 y.o. male ***with history of hypertension, schizophrenia, prediabetes, depression, polysubstance abuse (cocaine, marijuana, tobacco), CKDIIIa.  Admitted 1/7- 01/01/2023 with atypical chest pain after presentation to behavioral health for reports of suicidal homicidal ideation.  Dyspnea exacerbated by cocaine use and profound hypertension.  Amlodipine stopped, irbesartan started.  Atorvastatin increased to 80 mg daily.  Coronary CTA with nonobstructive disease ***. He was discharged back to behavioral health for further treatment of his schizophrenia and, depression.  ROS: Please see the history of present illness.    All other systems reviewed and are negative.   Studies Reviewed: .        *** Risk Assessment/Calculations:   {Does this patient have ATRIAL FIBRILLATION?:[phone]} No BP recorded.  {Refresh Note OR Click here to enter BP  :1}***       Physical Exam:   VS:  There were no vitals taken for this visit.   Wt Readings from Last 3 Encounters:  01/01/24 148 lb 3.2 oz (67.2 kg)  10/17/23 160 lb (72.6 kg)  09/05/22 160 lb (72.6 kg)    GEN: Well nourished, well developed in no acute distress NECK: No JVD; No carotid bruits CARDIAC: ***RRR, no murmurs, rubs, gallops RESPIRATORY:  Clear to auscultation without rales, wheezing or rhonchi  ABDOMEN: Soft, non-tender, non-distended EXTREMITIES:  No edema; No deformity   ASSESSMENT AND PLAN: .   ***    {Are you ordering a CV Procedure (e.g. stress test, cath, DCCV, TEE, etc)?   Press F2        :[id number]}  Dispo: ***  Signed, Alver Sorrow, NP

## 2024-02-21 ENCOUNTER — Encounter (HOSPITAL_COMMUNITY): Payer: Self-pay

## 2024-02-21 ENCOUNTER — Ambulatory Visit (HOSPITAL_COMMUNITY)
Admission: EM | Admit: 2024-02-21 | Discharge: 2024-02-21 | Disposition: A | Discharge status: Home / Self Care | Payer: MEDICAID | Attending: Emergency Medicine | Admitting: Emergency Medicine

## 2024-02-21 DIAGNOSIS — R079 Chest pain, unspecified: Secondary | ICD-10-CM

## 2024-02-21 DIAGNOSIS — B349 Viral infection, unspecified: Secondary | ICD-10-CM

## 2024-02-21 LAB — POC COVID19/FLU A&B COMBO
Covid Antigen, POC: NEGATIVE
Influenza A Antigen, POC: NEGATIVE
Influenza B Antigen, POC: NEGATIVE

## 2024-02-21 MED ORDER — ONDANSETRON 4 MG PO TBDP: 4.0000 mg | ORAL_TABLET | Freq: Three times a day (TID) | ORAL | 0 refills | Status: AC | PRN

## 2024-02-21 NOTE — ED Provider Notes (Addendum)
 [facility]    CSN: [id number] Arrival date & time: 02/21/24  1510      History   Chief Complaint Chief Complaint  Patient presents with   Abdominal Pain   Nausea   Emesis   Generalized Body Aches    HPI Randy Collins is a 63 y.o. male.   Patient presents with abdominal pain, nausea, vomiting, diarrhea, body aches, chills, sore throat, congestion, mild cough, and chest pain that began yesterday.  Denies shortness of breath, dizziness, numbness, tingling, and weakness.  History of hypertension, CKD, and cocaine abuse.   Abdominal Pain Associated symptoms: vomiting   Emesis Associated symptoms: abdominal pain     Past Medical History:  Diagnosis Date   Arthritis 2009   likely psoriatic arthritis--inflammatory, but not symmetric, left hip and back, right elbow, both knees   Hypertension 2014   Psoriasis 2009   Right lumbar radiculopathy 2019    Patient Active Problem List   Diagnosis Date Noted   Cocaine abuse (HCC) 01/03/2024   ETOH abuse 01/03/2024   Marijuana abuse 01/03/2024   Schizoaffective disorder, depressive type (HCC) 01/02/2024   Atypical chest pain 01/01/2024   Pure hypercholesterolemia 01/01/2024   CKD (chronic kidney disease), stage III (HCC) 01/01/2024   Schizophrenia (HCC) 01/01/2024   Anxiety and depression 01/01/2024   Prediabetes 01/01/2024   Polysubstance abuse (HCC) 01/01/2024   Needs assistance with community resources 05/13/2023   Adjustment disorder with anxious mood 05/13/2023   Encounter for medication management 05/13/2023   Insomnia 05/13/2023   Spinal stenosis of lumbar region 09/05/2022   Protrusion of lumbar intervertebral disc 03/19/2022   Hypertensive urgency 10/20/2021   Tobacco abuse 10/20/2021   Right arm numbness 10/20/2021   Elevated troponin 10/20/2021   Other bipolar disorder (HCC) 10/20/2021   Psoriasis    Right lumbar radiculopathy 12/24/2017   Essential hypertension 12/24/2012   Arthritis  12/25/2007    History reviewed. No pertinent surgical history.     Home Medications    Prior to Admission medications   Medication Sig Start Date End Date Taking? Authorizing Provider  ondansetron (ZOFRAN-ODT) 4 MG disintegrating tablet Take 1 tablet (4 mg total) by mouth every 8 (eight) hours as needed for nausea or vomiting. 02/21/24  Yes Wynonia Lawman A, NP  amLODipine (NORVASC) 10 MG tablet Take 1 tablet (10 mg total) by mouth daily. 01/08/24   Darrold Junker E, NP  aspirin EC 81 MG tablet Take 1 tablet (81 mg total) by mouth daily. Swallow whole. 01/08/24   Remington, Amber E, NP  atorvastatin (LIPITOR) 80 MG tablet Take 1 tablet (80 mg total) by mouth at bedtime. 01/07/24   Remington, Amber E, NP  benztropine (COGENTIN) 1 MG tablet Take 1 tablet (1 mg total) by mouth daily as needed for tremors. 01/07/24   Remington, Amber E, NP  escitalopram (LEXAPRO) 10 MG tablet Take 1 tablet (10 mg total) by mouth daily. 01/08/24   Remington, Amber E, NP  haloperidol (HALDOL) 1 MG tablet Take 2.5 tablets (2.5 mg total) by mouth 2 (two) times daily. 01/07/24   Remington, Amber E, NP  irbesartan (AVAPRO) 150 MG tablet Take 1 tablet (150 mg total) by mouth daily. 01/07/24   Remington, Amber E, NP  loratadine (CLARITIN) 10 MG tablet Take 1 tablet (10 mg total) by mouth daily. 01/08/24   Remington, Amber E, NP  melatonin 5 MG TABS Take 1 tablet (5 mg total) by mouth at bedtime. 01/07/24   Lovette Cliche, NP  pantoprazole (PROTONIX) 40 MG tablet Take 1 tablet (40 mg total) by mouth daily. 01/07/24   Lovette Cliche, NP    Family History Family History  Problem Relation Age of Onset   Hypertension Mother    Arthritis Mother    Gout Mother    Hyperlipidemia Mother    Gout Father    Alcohol abuse Father        probable cirrhosis   Heart disease Brother 29       AMI   HIV/AIDS Brother    Cancer Brother        not sure what primary    Social History Social History   Tobacco Use   Smoking  status: Every Day    Current packs/day: 1.00    Types: Cigarettes   Smokeless tobacco: Never   Tobacco comments:    [facility]  Vaping Use   Vaping status: Never Used  Substance Use Topics   Alcohol use: Yes   Drug use: Yes    Types: Marijuana    Comment: history of speed, MJ, Acid.  Last time use was 1999     Allergies   Patient has no known allergies.   Review of Systems Review of Systems  Gastrointestinal:  Positive for abdominal pain and vomiting.   Per HPI  Physical Exam Triage Vital Signs ED Triage Vitals [02/21/24 1647]  Encounter Vitals Group     BP (!) 152/89     Systolic BP Percentile      Diastolic BP Percentile      Pulse Rate 66     Resp 16     Temp 97.9 F (36.6 C)     Temp Source Oral     SpO2 96 %     Weight      Height      Head Circumference      Peak Flow      Pain Score 7     Pain Loc      Pain Education      Exclude from Growth Chart    No data found.  Updated Vital Signs BP (!) 152/89 (BP Location: Left Arm)   Pulse 66   Temp 97.9 F (36.6 C) (Oral)   Resp 16   SpO2 96%   Visual Acuity Right Eye Distance:   Left Eye Distance:   Bilateral Distance:    Right Eye Near:   Left Eye Near:    Bilateral Near:     Physical Exam Vitals and nursing note reviewed.  Constitutional:      General: He is awake. He is not in acute distress.    Appearance: Normal appearance. He is well-developed and well-groomed. He is not ill-appearing.  HENT:     Right Ear: Tympanic membrane, ear canal and external ear normal.     Left Ear: Tympanic membrane, ear canal and external ear normal.     Nose: Congestion and rhinorrhea present.     Mouth/Throat:     Mouth: Mucous membranes are moist.     Pharynx: Posterior oropharyngeal erythema present. No oropharyngeal exudate.  Cardiovascular:     Rate and Rhythm: Normal rate and regular rhythm.  Pulmonary:     Effort: Pulmonary effort is normal.  Abdominal:     General:  Abdomen is flat. Bowel sounds are normal.     Palpations: Abdomen is soft.     Tenderness: There is no abdominal tenderness.  Musculoskeletal:  General: Normal range of motion.     Cervical back: Normal range of motion and neck supple.  Skin:    General: Skin is warm and dry.  Neurological:     Mental Status: He is alert.  Psychiatric:        Behavior: Behavior is cooperative.      UC Treatments / Results  Labs (all labs ordered are listed, but only abnormal results are displayed) Labs Reviewed  POC COVID19/FLU A&B COMBO - Normal    EKG   Radiology No results found.  Procedures Procedures (including critical care time)  Medications Ordered in UC Medications - No data to display  Initial Impression / Assessment and Plan / UC Course  I have reviewed the triage vital signs and the nursing notes.  Pertinent labs & imaging results that were available during my care of the patient were reviewed by me and considered in my medical decision making (see chart for details).     Patient presented with abdominal pain, nausea, vomiting, diarrhea, body aches, chills, sore throat, congestion, mild cough, and chest pain that began yesterday.  Upon assessment congestion and rhinorrhea are present, mild erythema noted to pharynx.  Lungs clear bilaterally auscultation.  Heart sounds normal with regular rhythm.  Nontender upon palpation to abdomen.  EKG revealed sinus bradycardia without ST elevation, regimen, and acute cardiac findings.  Flu and COVID testing negative.  Prescribe Zofran as needed for nausea and vomiting.  Discussed over-the-counter medications for viral illness related symptoms.  Discussed return and strict ER precautions. Final Clinical Impressions(s) / UC Diagnoses   Final diagnoses:  Viral illness  Chest pain, unspecified type     Discharge Instructions      You tested negative for flu and COVID today.  I believe your symptoms are from a viral illness.  I have prescribed Zofran that you can take every 8 hours as needed for nausea and vomiting. You can alternate between Tylenol and Ibuprofen as needed for pain and fever. I recommend Mucinex for cough and congestion as needed. Stay hydrated and get plenty of rest. Return here if symptoms persist or worsen.  If you develop worsening chest pain, shortness of breath, numbness, tingling, weakness, or passing out please seek immediate medical treatment in the ER.      ED Prescriptions     Medication Sig Dispense Auth. Provider   ondansetron (ZOFRAN-ODT) 4 MG disintegrating tablet Take 1 tablet (4 mg total) by mouth every 8 (eight) hours as needed for nausea or vomiting. 10 tablet Wynonia Lawman A, NP      PDMP not reviewed this encounter.   Letta Kocher, NP 02/21/24 1820    Wynonia Lawman A, NP 02/21/24 [phone]

## 2024-02-21 NOTE — ED Triage Notes (Signed)
 Patient c/o abdominal pain, N/v, body aches since yesterday.

## 2024-02-21 NOTE — Discharge Instructions (Addendum)
 You tested negative for flu and COVID today.  I believe your symptoms are from a viral illness. I have prescribed Zofran that you can take every 8 hours as needed for nausea and vomiting. You can alternate between Tylenol and Ibuprofen as needed for pain and fever. I recommend Mucinex for cough and congestion as needed. Stay hydrated and get plenty of rest. Return here if symptoms persist or worsen.  If you develop worsening chest pain, shortness of breath, numbness, tingling, weakness, or passing out please seek immediate medical treatment in the ER.

## 2024-05-25 ENCOUNTER — Ambulatory Visit: Payer: MEDICAID | Admitting: Orthopedic Surgery

## 2024-05-25 ENCOUNTER — Other Ambulatory Visit (INDEPENDENT_AMBULATORY_CARE_PROVIDER_SITE_OTHER): Payer: MEDICAID

## 2024-05-25 DIAGNOSIS — M5441 Lumbago with sciatica, right side: Secondary | ICD-10-CM

## 2024-05-25 DIAGNOSIS — G8929 Other chronic pain: Secondary | ICD-10-CM

## 2024-05-25 MED ORDER — PREDNISONE 10 MG PO TABS: 10.0000 mg | ORAL_TABLET | Freq: Every day | ORAL | 0 refills | Status: DC

## 2024-05-31 ENCOUNTER — Encounter: Payer: Self-pay | Admitting: Orthopedic Surgery

## 2024-05-31 NOTE — Progress Notes (Signed)
 Office Visit Note   Patient: Randy Collins           Date of Birth: [birth year]           MRN: [id number] Visit Date: 05/25/2024              Requested by: Charle Congo, MD [address] [place],  [place] [address] PCP: Charle Congo, MD  Chief Complaint  Patient presents with   Lower Back - Pain      HPI: Patient is a 63 year old gentleman who has had chronic lower back pain.  Patient states his radicular symptoms go down the right leg.  He states he has numbness and tingling in his foot and pain with ambulation.  Patient states that leaning on his forearms helps decrease his symptoms.  Patient has had epidural steroid injections with Dr. Daisey Dryer in the past which helped a little.  Assessment & Plan: Visit Diagnoses:  1. Chronic right-sided low back pain with right-sided sciatica     Plan: Order placed for prednisone .  Reevaluate to see if patient would benefit from epidural steroid injections.  Follow-Up Instructions: Return in about 4 weeks (around 06/22/2024).   Ortho Exam  Patient is alert, oriented, no adenopathy, well-dressed, normal affect, normal respiratory effort. Examination patient has a negative straight leg raise bilaterally radicular symptoms into the right leg.  Forward flexion helps with symptoms consistent with stenosis.  Patient has completed physical therapy.  No focal motor weakness.    Imaging: No results found. No images are attached to the encounter.  Labs: Lab Results  Component Value Date   HGBA1C 5.8 (H) 01/04/2024   HGBA1C 5.8 (H) 12/31/2023   HGBA1C 5.4 10/21/2021   ESRSEDRATE 5 11/07/2018     Lab Results  Component Value Date   ALBUMIN 3.5 12/31/2023   ALBUMIN 3.4 (L) 10/20/2021   ALBUMIN 4.3 11/07/2018    Lab Results  Component Value Date   MG 2.0 12/31/2023   Lab Results  Component Value Date   VD25OH 22 (L) 06/29/2022    No results found for: "PREALBUMIN"    Latest Ref Rng & Units 12/31/2023    2:50 PM  10/17/2023    4:11 AM 06/29/2022   12:00 AM  CBC EXTENDED  WBC 4.0 - 10.5 K/uL 6.4  8.0  4.7   RBC 4.22 - 5.81 MIL/uL 5.15  4.93  3.97   Hemoglobin 13.0 - 17.0 g/dL 86.5  78.4  69.6   HCT 39.0 - 52.0 % 47.4  43.6  36.6   Platelets 150 - 400 K/uL 306  252  282   NEUT# 1.7 - 7.7 K/uL 4.5     Lymph# 0.7 - 4.0 K/uL 1.3        There is no height or weight on file to calculate BMI.  Orders:  Orders Placed This Encounter  Procedures   XR Lumbar Spine 2-3 Views   Meds ordered this encounter  Medications   predniSONE  (DELTASONE ) 10 MG tablet    Sig: Take 1 tablet (10 mg total) by mouth daily with breakfast.    Dispense:  30 tablet    Refill:  0     Procedures: No procedures performed  Clinical Data: No additional findings.  ROS:  All other systems negative, except as noted in the HPI. Review of Systems  Objective: Vital Signs: There were no vitals taken for this visit.  Specialty Comments:  MRI LUMBAR SPINE WITHOUT CONTRAST   TECHNIQUE: Multiplanar, multisequence MR imaging  of the lumbar spine was performed. No intravenous contrast was administered.   COMPARISON:  05/25/2021 radiographs   FINDINGS: Segmentation: The lowest lumbar type non-rib-bearing vertebra is labeled as L5.   Alignment:  5 mm degenerative anterolisthesis at L4-5.   Vertebrae: Disc desiccation at L4-5. Small bilateral facet joint effusions at the L4-5 level.   Conus medullaris and cauda equina: Conus extends to the L1 level. Conus and cauda equina appear normal.   Paraspinal and other soft tissues: Unremarkable   Disc levels:   L1-2: Unremarkable.   L2-3: No impingement.  Mild left degenerative facet arthropathy.   L3-4: Mild left and borderline right foraminal stenosis along with mild left subarticular lateral recess stenosis due to degenerative facet arthropathy and mild disc bulge.   L4-5: Moderate to prominent bilateral foraminal stenosis, moderate to prominent left and moderate  right subarticular lateral recess stenosis, and moderate central narrowing of the thecal sac due to disc uncovering, disc bulge, left paracentral disc protrusion, and degenerative facet arthropathy.   L5-S1: Moderate left and mild right foraminal stenosis with borderline left subarticular lateral recess stenosis due to disc bulge and facet arthropathy.   IMPRESSION: 1. Lumbar spondylosis and degenerative disc disease, causing moderate to prominent impingement at L4-5; moderate impingement at L5-S1; and mild impingement at L3-4, as detailed above. 2. Small bilateral facet joint effusions at L4-5. 3. 5 mm degenerative anterolisthesis at L4-5.     Electronically Signed   By: Freida Jes M.D.   On: 03/09/2022 16:40  PMFS History: Patient Active Problem List   Diagnosis Date Noted   Cocaine abuse (HCC) 01/03/2024   ETOH abuse 01/03/2024   Marijuana abuse 01/03/2024   Schizoaffective disorder, depressive type (HCC) 01/02/2024   Atypical chest pain 01/01/2024   Pure hypercholesterolemia 01/01/2024   CKD (chronic kidney disease), stage III (HCC) 01/01/2024   Schizophrenia (HCC) 01/01/2024   Anxiety and depression 01/01/2024   Prediabetes 01/01/2024   Polysubstance abuse (HCC) 01/01/2024   Needs assistance with community resources 05/13/2023   Adjustment disorder with anxious mood 05/13/2023   Encounter for medication management 05/13/2023   Insomnia 05/13/2023   Spinal stenosis of lumbar region 09/05/2022   Protrusion of lumbar intervertebral disc 03/19/2022   Hypertensive urgency 10/20/2021   Tobacco abuse 10/20/2021   Right arm numbness 10/20/2021   Elevated troponin 10/20/2021   Other bipolar disorder (HCC) 10/20/2021   Psoriasis    Right lumbar radiculopathy 12/24/2017   Essential hypertension 12/24/2012   Arthritis 12/25/2007   Past Medical History:  Diagnosis Date   Arthritis 2009   likely psoriatic arthritis--inflammatory, but not symmetric, left hip and  back, right elbow, both knees   Hypertension 2014   Psoriasis 2009   Right lumbar radiculopathy 2019    Family History  Problem Relation Age of Onset   Hypertension Mother    Arthritis Mother    Gout Mother    Hyperlipidemia Mother    Gout Father    Alcohol abuse Father        probable cirrhosis   Heart disease Brother 48       AMI   HIV/AIDS Brother    Cancer Brother        not sure what primary    History reviewed. No pertinent surgical history. Social History   Occupational History   Not on file  Tobacco Use   Smoking status: Every Day    Current packs/day: 1.00    Types: Cigarettes   Smokeless tobacco: Never  Tobacco comments:    [facility]  Vaping Use   Vaping status: Never Used  Substance and Sexual Activity   Alcohol use: Yes   Drug use: Yes    Types: Marijuana    Comment: history of speed, MJ, Acid.  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

## 2024-06-18 ENCOUNTER — Ambulatory Visit (INDEPENDENT_AMBULATORY_CARE_PROVIDER_SITE_OTHER): Payer: MEDICAID

## 2024-06-18 ENCOUNTER — Ambulatory Visit (HOSPITAL_COMMUNITY)
Admission: EM | Admit: 2024-06-18 | Discharge: 2024-06-18 | Disposition: A | Discharge status: Home / Self Care | Payer: MEDICAID | Attending: Emergency Medicine | Admitting: Emergency Medicine

## 2024-06-18 ENCOUNTER — Encounter (HOSPITAL_COMMUNITY): Payer: Self-pay

## 2024-06-18 DIAGNOSIS — R051 Acute cough: Secondary | ICD-10-CM

## 2024-06-18 DIAGNOSIS — J441 Chronic obstructive pulmonary disease with (acute) exacerbation: Secondary | ICD-10-CM

## 2024-06-18 MED ORDER — ALBUTEROL SULFATE HFA 108 (90 BASE) MCG/ACT IN AERS: 1.0000 | INHALATION_SPRAY | Freq: Four times a day (QID) | RESPIRATORY_TRACT | 0 refills | Status: AC | PRN

## 2024-06-18 MED ORDER — BENZONATATE 100 MG PO CAPS: 100.0000 mg | ORAL_CAPSULE | Freq: Three times a day (TID) | ORAL | 0 refills | Status: AC

## 2024-06-18 MED ORDER — AZELASTINE HCL 0.1 % NA SOLN: 2.0000 | Freq: Two times a day (BID) | NASAL | 0 refills | Status: AC

## 2024-06-18 MED ORDER — PREDNISONE 20 MG PO TABS: 40.0000 mg | ORAL_TABLET | Freq: Every day | ORAL | 0 refills | Status: AC

## 2024-06-18 NOTE — ED Triage Notes (Signed)
 Pt with c/o cough and shortness of breath for about 4 weeks. Pt states he had left over abx at home and had been taking them which helped but he ran out last week.

## 2024-06-18 NOTE — Discharge Instructions (Signed)
 Start taking 2 tablets of prednisone  once daily for 5 days for COPD exacerbation. Use albuterol inhaler every 6 hours as needed for shortness of breath and wheezing. Take Tessalon every 8 hours as needed for cough.   Use azelastine nasal spray twice daily for congestion. Follow-up with your primary care provider for further evaluation and management of your COPD. Return here as needed.  If you develop severe trouble breathing, chest pain, feeling like you are going to pass out, or fevers unrelieved by medication please seek immediate medical treatment in the emergency department.

## 2024-06-18 NOTE — ED Provider Notes (Signed)
 [facility]    CSN: [id number] Arrival date & time: 06/18/24  1222      History   Chief Complaint Chief Complaint  Patient presents with   Cough    HPI Randy Collins is a 63 y.o. male.   Patient presents with cough, sore throat, nasal congestion, chest congestion, and progressively worsening shortness of breath x 4 weeks.  Denies known fever, body aches, chills, nausea, vomiting, diarrhea, abdominal pain, and chest pain.  Patient states that he had some leftover antibiotic at home and took the remaining 6 tablets and states that he was feeling a little bit better but then he ran out of these last week and his symptoms worsened again.  Patient denies taking any other medications for his symptoms.  Patient reports history of asthma, but states that his doctor recently took him off of his albuterol inhaler and nasal spray stating that he no longer needs this.  The history is provided by the patient and medical records.  Cough   Past Medical History:  Diagnosis Date   Arthritis 2009   likely psoriatic arthritis--inflammatory, but not symmetric, left hip and back, right elbow, both knees   Hypertension 2014   Psoriasis 2009   Right lumbar radiculopathy 2019    Patient Active Problem List   Diagnosis Date Noted   Cocaine abuse (HCC) 01/03/2024   ETOH abuse 01/03/2024   Marijuana abuse 01/03/2024   Schizoaffective disorder, depressive type (HCC) 01/02/2024   Atypical chest pain 01/01/2024   Pure hypercholesterolemia 01/01/2024   CKD (chronic kidney disease), stage III (HCC) 01/01/2024   Schizophrenia (HCC) 01/01/2024   Anxiety and depression 01/01/2024   Prediabetes 01/01/2024   Polysubstance abuse (HCC) 01/01/2024   Needs assistance with community resources 05/13/2023   Adjustment disorder with anxious mood 05/13/2023   Encounter for medication management 05/13/2023   Insomnia 05/13/2023   Spinal stenosis of lumbar region 09/05/2022   Protrusion of  lumbar intervertebral disc 03/19/2022   Hypertensive urgency 10/20/2021   Tobacco abuse 10/20/2021   Right arm numbness 10/20/2021   Elevated troponin 10/20/2021   Other bipolar disorder (HCC) 10/20/2021   Psoriasis    Right lumbar radiculopathy 12/24/2017   Essential hypertension 12/24/2012   Arthritis 12/25/2007    History reviewed. No pertinent surgical history.     Home Medications    Prior to Admission medications   Medication Sig Start Date End Date Taking? Authorizing Provider  albuterol (VENTOLIN HFA) 108 (90 Base) MCG/ACT inhaler Inhale 1-2 puffs into the lungs every 6 (six) hours as needed for wheezing or shortness of breath. 06/18/24  Yes Johnie, Demarri Elie A, NP  azelastine (ASTELIN) 0.1 % nasal spray Place 2 sprays into both nostrils 2 (two) times daily. Use in each nostril as directed 06/18/24  Yes Johnie Flaming A, NP  benzonatate (TESSALON) 100 MG capsule Take 1 capsule (100 mg total) by mouth every 8 (eight) hours. 06/18/24  Yes Johnie, Lauretta Sallas A, NP  predniSONE  (DELTASONE ) 20 MG tablet Take 2 tablets (40 mg total) by mouth daily for 5 days. 06/18/24 06/23/24 Yes Katarina Riebe A, NP  amLODipine  (NORVASC ) 10 MG tablet Take 1 tablet (10 mg total) by mouth daily. 01/08/24   Remington, Amber E, NP  aspirin  EC 81 MG tablet Take 1 tablet (81 mg total) by mouth daily. Swallow whole. 01/08/24   Remington, Amber E, NP  atorvastatin  (LIPITOR) 80 MG tablet Take 1 tablet (80 mg total) by mouth at bedtime. 01/07/24   Arvilla, [facility]  E, NP  benztropine  (COGENTIN ) 1 MG tablet Take 1 tablet (1 mg total) by mouth daily as needed for tremors. 01/07/24   Remington, Amber E, NP  escitalopram  (LEXAPRO ) 10 MG tablet Take 1 tablet (10 mg total) by mouth daily. 01/08/24   Remington, Amber E, NP  haloperidol  (HALDOL ) 1 MG tablet Take 2.5 tablets (2.5 mg total) by mouth 2 (two) times daily. 01/07/24   Remington, Amber E, NP  irbesartan  (AVAPRO ) 150 MG tablet Take 1 tablet (150 mg total) by mouth  daily. 01/07/24   Remington, Amber E, NP  loratadine  (CLARITIN ) 10 MG tablet Take 1 tablet (10 mg total) by mouth daily. 01/08/24   Remington, Amber E, NP  melatonin 5 MG TABS Take 1 tablet (5 mg total) by mouth at bedtime. 01/07/24   Remington, Amber E, NP  ondansetron  (ZOFRAN -ODT) 4 MG disintegrating tablet Take 1 tablet (4 mg total) by mouth every 8 (eight) hours as needed for nausea or vomiting. 02/21/24   Johnie Flaming A, NP  pantoprazole  (PROTONIX ) 40 MG tablet Take 1 tablet (40 mg total) by mouth daily. 01/07/24   Arvilla Jeoffrey BRAVO, NP    Family History Family History  Problem Relation Age of Onset   Hypertension Mother    Arthritis Mother    Gout Mother    Hyperlipidemia Mother    Gout Father    Alcohol abuse Father        probable cirrhosis   Heart disease Brother 3       AMI   HIV/AIDS Brother    Cancer Brother        not sure what primary    Social History Social History   Tobacco Use   Smoking status: Every Day    Current packs/day: 1.00    Types: Cigarettes   Smokeless tobacco: Never   Tobacco comments:    [facility]  Vaping Use   Vaping status: Never Used  Substance Use Topics   Alcohol use: Yes   Drug use: Yes    Types: Marijuana    Comment: history of speed, MJ, Acid.  Last time use was 1999     Allergies   Patient has no known allergies.   Review of Systems Review of Systems  Respiratory:  Positive for cough.    Per HPI  Physical Exam Triage Vital Signs ED Triage Vitals  Encounter Vitals Group     BP 06/18/24 1238 (!) 155/98     Girls Systolic BP Percentile --      Girls Diastolic BP Percentile --      Boys Systolic BP Percentile --      Boys Diastolic BP Percentile --      Pulse Rate 06/18/24 1238 84     Resp 06/18/24 1238 18     Temp 06/18/24 1238 98 F (36.7 C)     Temp Source 06/18/24 1238 Oral     SpO2 06/18/24 1238 94 %     Weight --      Height --      Head Circumference --      Peak Flow --       Pain Score 06/18/24 1239 2     Pain Loc --      Pain Education --      Exclude from Growth Chart --    No data found.  Updated Vital Signs BP (!) 155/98 (BP Location: Right Arm)   Pulse 84   Temp 98 F (  36.7 C) (Oral)   Resp 18   SpO2 94%   Visual Acuity Right Eye Distance:   Left Eye Distance:   Bilateral Distance:    Right Eye Near:   Left Eye Near:    Bilateral Near:     Physical Exam Vitals and nursing note reviewed.  Constitutional:      General: He is awake. He is not in acute distress.    Appearance: Normal appearance. He is well-developed and well-groomed. He is not ill-appearing.  HENT:     Right Ear: Tympanic membrane, ear canal and external ear normal.     Left Ear: Tympanic membrane, ear canal and external ear normal.     Nose: Congestion and rhinorrhea present.     Mouth/Throat:     Mouth: Mucous membranes are moist.     Pharynx: Posterior oropharyngeal erythema present. No oropharyngeal exudate.   Cardiovascular:     Rate and Rhythm: Normal rate and regular rhythm.  Pulmonary:     Effort: Pulmonary effort is normal.     Breath sounds: Examination of the right-upper field reveals rales. Examination of the left-upper field reveals rales. Rales present.   Skin:    General: Skin is warm and dry.   Neurological:     Mental Status: He is alert.   Psychiatric:        Behavior: Behavior is cooperative.      UC Treatments / Results  Labs (all labs ordered are listed, but only abnormal results are displayed) Labs Reviewed - No data to display  EKG   Radiology DG Chest 2 View Result Date: 06/18/2024 CLINICAL DATA:  cough, shortness of  breath. EXAM: CHEST - 2 VIEW COMPARISON:  12/31/2023. FINDINGS: Bilateral lungs appear hyperlucent with coarse bronchovascular markings, in keeping with COPD. Bilateral lungs otherwise appear clear. No dense consolidation or lung collapse. Bilateral costophrenic angles are clear. Normal cardio-mediastinal silhouette.  No acute osseous abnormalities. The soft tissues are within normal limits. IMPRESSION: No active cardiopulmonary disease. COPD. Electronically Signed   By: Ree Molt M.D.   On: 06/18/2024 13:14    Procedures Procedures (including critical care time)  Medications Ordered in UC Medications - No data to display  Initial Impression / Assessment and Plan / UC Course  I have reviewed the triage vital signs and the nursing notes.  Pertinent labs & imaging results that were available during my care of the patient were reviewed by me and considered in my medical decision making (see chart for details).     Patient is overall well-appearing.  Vitals are stable.  Upon assessment congestion and rhinorrhea are present, mild erythema noted to pharynx.  Mild fine rales auscultated in the bilateral upper lungs.  Without wheezing, rhonchi, decreased air movement, or stridor.  Chest x-ray ordered.  Based on my interpretation there is no active cardiopulmonary disease.  Radiology report suggests evidence of COPD without active cardiopulmonary disease.  Symptoms likely related to exacerbation of underlying COPD.  Prescribed prednisone  burst for COPD exacerbation.  Prescribed albuterol inhaler as needed for shortness of breath and wheezing.  Prescribed Tessalon as needed for cough.  Prescribed azelastine for congestion.  Discussed follow-up, return, and strict ER precautions Final Clinical Impressions(s) / UC Diagnoses   Final diagnoses:  Acute cough  COPD exacerbation (HCC)     Discharge Instructions      Start taking 2 tablets of prednisone  once daily for 5 days for COPD exacerbation. Use albuterol inhaler every 6 hours as needed for shortness  of breath and wheezing. Take Tessalon every 8 hours as needed for cough.   Use azelastine nasal spray twice daily for congestion. Follow-up with your primary care provider for further evaluation and management of your COPD. Return here as needed.  If you  develop severe trouble breathing, chest pain, feeling like you are going to pass out, or fevers unrelieved by medication please seek immediate medical treatment in the emergency department.      ED Prescriptions     Medication Sig Dispense Auth. Provider   predniSONE  (DELTASONE ) 20 MG tablet Take 2 tablets (40 mg total) by mouth daily for 5 days. 10 tablet Johnie Flaming A, NP   albuterol (VENTOLIN HFA) 108 (90 Base) MCG/ACT inhaler Inhale 1-2 puffs into the lungs every 6 (six) hours as needed for wheezing or shortness of breath. 6.7 g Johnie Flaming A, NP   benzonatate (TESSALON) 100 MG capsule Take 1 capsule (100 mg total) by mouth every 8 (eight) hours. 21 capsule Johnie, Wendie Diskin A, NP   azelastine (ASTELIN) 0.1 % nasal spray Place 2 sprays into both nostrils 2 (two) times daily. Use in each nostril as directed 30 mL Johnie Flaming A, NP      PDMP not reviewed this encounter.   Johnie Flaming A, NP 06/18/24 1349

## 2024-06-24 ENCOUNTER — Ambulatory Visit: Payer: MEDICAID | Admitting: Orthopedic Surgery

## 2024-08-12 ENCOUNTER — Other Ambulatory Visit (INDEPENDENT_AMBULATORY_CARE_PROVIDER_SITE_OTHER): Payer: MEDICAID

## 2024-08-12 ENCOUNTER — Ambulatory Visit (INDEPENDENT_AMBULATORY_CARE_PROVIDER_SITE_OTHER): Payer: MEDICAID | Admitting: Orthopedic Surgery

## 2024-08-12 VITALS — BP 155/88 | HR 64 | Ht 70.0 in | Wt 149.4 lb

## 2024-08-12 DIAGNOSIS — M5416 Radiculopathy, lumbar region: Secondary | ICD-10-CM

## 2024-08-12 DIAGNOSIS — Z720 Tobacco use: Secondary | ICD-10-CM | POA: Diagnosis not present

## 2024-08-12 DIAGNOSIS — M545 Low back pain, unspecified: Secondary | ICD-10-CM

## 2024-08-12 NOTE — Progress Notes (Signed)
 Orthopedic Spine Surgery Office Note  Assessment: Patient is a 63 y.o. male with low back pain that radiates into the bilateral lower extremities, suspect radiculopathy   Plan: -Explained that initially conservative treatment is tried as a significant number of patients may experience relief with these treatment modalities. Discussed that the conservative treatments include:  -activity modification  -physical therapy  -over the counter pain medications  -medrol  dosepak  -lumbar steroid injections -Patient has tried PT, Tylenol , ibuprofen, oral steroids, steroid injections - Patient has had symptoms for over a year now in spite of conservative treatments, so recommended an MRI of the lumbar spine to evaluate for radiculopathy -Patient should return to office in 4 weeks, x-rays at next visit: none   I spent 5 minutes with the patient going over some of the risks with smoking and nicotine  use.  I told him that he is already had some of the issues that are seen to be higher in smokers which would be his heart attack.  I told him from a surgeons perspective, nicotine  use increases the rate of wound healing complications, infections, and pseudoarthrosis all of which can lead to need for additional surgeries.  I encouraged cessation.  Patient is going to work on quitting  ___________________________________________________________________________   History:  Patient is a 63 y.o. male who presents today for lumbar spine.  Patient has had several years of low back pain that has gotten progressively worse with time.  He also has pain that radiates into the bilateral lower extremities.  He feels the going into the right buttock, right lateral thigh, right lateral leg.  On the left side, he feels the going into the lateral thigh.  There is no trauma or injury that preceded the onset of the pain.  He feels the pain on a daily basis.  He notices the pain with activity and at rest.  He notes that is worse  though if he is more active.   Weakness: Denies Symptoms of imbalance: Denies Paresthesias and numbness: Denies Bowel or bladder incontinence: Denies Saddle anesthesia: Denies  Treatments tried: PT, tylenol , ibuprofen, oral steroids, steroid injections  Review of systems: Denies fevers and chills, night sweats, unexplained weight loss, history of cancer.  Has had pain that wakes him at night  Past medical history: HTN History of MI History of TIA  Allergies: NKDA  Past surgical history:  None  Social history: Reports use of nicotine  product (smoking, vaping, patches, smokeless) Alcohol use: rare Denies recreational drug use   Physical Exam:  BMI of 21.4  General: no acute distress, appears stated age Neurologic: alert, answering questions appropriately, following commands Respiratory: unlabored breathing on room air, symmetric chest rise Psychiatric: appropriate affect, normal cadence to speech   MSK (spine):  -Strength exam      Left  Right EHL    5/5  5/5 TA    5/5  5/5 GSC    5/5  5/5 Knee extension  5/5  5/5 Hip flexion   5/5  5/5  -Sensory exam    Sensation intact to light touch in L3-S1 nerve distributions of bilateral lower extremities  -Achilles DTR: 2/4 on the left, 2/4 on the right -Patellar tendon DTR: 2/4 on the left, 2/4 on the right  -Straight leg raise: negative bilaterally -Clonus: no beats bilaterally  -Left hip exam: no pain through range of motion -Right hip exam: no pain through range of motion  Imaging: XRs of the lumbar spine from 08/12/2024 were independently reviewed and interpreted,  showing disc loss and spondylolisthesis at L4/5.  No other significant degenerative changes seen.  No fracture or dislocation seen.   Patient name: Randy Collins Patient MRN: [id number] Date of visit: 08/12/24

## 2024-08-27 ENCOUNTER — Encounter: Payer: Self-pay | Admitting: Orthopedic Surgery

## 2024-09-01 ENCOUNTER — Other Ambulatory Visit: Payer: MEDICAID

## 2024-09-09 ENCOUNTER — Telehealth: Payer: Self-pay | Admitting: Orthopedic Surgery

## 2024-09-09 NOTE — Telephone Encounter (Signed)
 Kiara from [facility] called stating pt's Medicaid Randy Collins is asking for peer to peer for pt MRI. Pt phone number is [phone].

## 2024-09-14 ENCOUNTER — Other Ambulatory Visit: Payer: Self-pay | Admitting: Orthopedic Surgery

## 2024-09-14 DIAGNOSIS — M5416 Radiculopathy, lumbar region: Secondary | ICD-10-CM

## 2024-09-16 ENCOUNTER — Ambulatory Visit (INDEPENDENT_AMBULATORY_CARE_PROVIDER_SITE_OTHER): Payer: MEDICAID | Admitting: Orthopedic Surgery

## 2024-09-16 DIAGNOSIS — M545 Low back pain, unspecified: Secondary | ICD-10-CM | POA: Diagnosis not present

## 2024-09-16 NOTE — Progress Notes (Signed)
 Orthopedic Spine Surgery Office Note   Assessment: Patient is a 63 y.o. male with low back pain that radiates into the bilateral lower extremities, suspect radiculopathy     Plan: -Patient has tried PT, Tylenol , ibuprofen, oral steroids, steroid injections -At her last visit, I recommended an MRI of his lumbar spine but this was denied by his insurance company since he did not do 6 weeks of PT -Patient is not interested in doing any PT, so talked about pain management as another option.  He was interested in that.  Referral provided to him today to [facility] -Patient would need to be nicotine  free prior to any elective spine surgery -Patient should return to office on an as needed basis   ___________________________________________________________________________     History:   Patient is a 63 y.o. male who presents today for follow up on his lumbar spine.  Patient continues to have low back pain that radiates into his bilateral lower extremities.  He feels the going into the right buttock, right lateral thigh, right lateral leg.  On the left side, he feels it going into the lateral thigh.  He has not noticed any changes in his symptoms since he was last seen.     Treatments tried: PT, tylenol , ibuprofen, oral steroids, steroid injections    Physical Exam:   General: no acute distress, appears stated age Neurologic: alert, answering questions appropriately, following commands Respiratory: unlabored breathing on room air, symmetric chest rise Psychiatric: appropriate affect, normal cadence to speech     MSK (spine):   -Strength exam                                                   Left                  Right EHL                              5/5                  5/5 TA                                 5/5                  5/5 GSC                             5/5                  5/5 Knee extension            5/5                  5/5 Hip flexion                    5/5                   5/5   -Sensory exam                           Sensation intact to light touch in L3-S1 nerve distributions of bilateral  lower extremities    Imaging: XRs of the lumbar spine from 08/12/2024 were previously independently reviewed and interpreted, showing disc loss and spondylolisthesis at L4/5.  No other significant degenerative changes seen.  No fracture or dislocation seen.     Patient name: Randy Collins Patient MRN: [id number] Date of visit: 09/16/24

## 2024-09-16 NOTE — Telephone Encounter (Signed)
 The patient came in to see Dr. Georgina today - was referred to PT. Initial PT visit has been scheduled.

## 2024-09-28 ENCOUNTER — Ambulatory Visit: Payer: MEDICAID | Admitting: Physical Therapy

## 2024-09-28 ENCOUNTER — Telehealth: Payer: Self-pay | Admitting: Physical Therapy

## 2024-11-06 ENCOUNTER — Ambulatory Visit (HOSPITAL_COMMUNITY)
Admission: EM | Admit: 2024-11-06 | Discharge: 2024-11-06 | Disposition: A | Discharge status: Home / Self Care | Payer: MEDICAID | Attending: Physician Assistant | Admitting: Physician Assistant

## 2024-11-06 ENCOUNTER — Encounter (HOSPITAL_COMMUNITY): Payer: Self-pay

## 2024-11-06 DIAGNOSIS — R0602 Shortness of breath: Secondary | ICD-10-CM

## 2024-11-06 DIAGNOSIS — J40 Bronchitis, not specified as acute or chronic: Secondary | ICD-10-CM | POA: Diagnosis not present

## 2024-11-06 DIAGNOSIS — J0100 Acute maxillary sinusitis, unspecified: Secondary | ICD-10-CM

## 2024-11-06 HISTORY — DX: Unspecified asthma, uncomplicated: J45.909

## 2024-11-06 MED ORDER — PREDNISONE 20 MG PO TABS: 40.0000 mg | ORAL_TABLET | Freq: Every day | ORAL | 0 refills | Status: AC

## 2024-11-06 MED ORDER — AMOXICILLIN-POT CLAVULANATE 875-125 MG PO TABS: 1.0000 | ORAL_TABLET | Freq: Two times a day (BID) | ORAL | 0 refills | Status: AC

## 2024-11-06 NOTE — ED Provider Notes (Signed)
 [facility]    CSN: [id number] Arrival date & time: 11/06/24  1241      History   Chief Complaint Chief Complaint  Patient presents with   Shortness of Breath    X 10 days     HPI NATHON STEFANSKI is a 63 y.o. male.  has a past medical history of Arthritis (2009), Asthma, Hypertension (2014), Psoriasis (2009), and Right lumbar radiculopathy (2019).   HPI  Discussed the use of AI scribe software for clinical note transcription with the patient, who gave verbal consent to proceed.  The patient, with asthma and COPD, presents with sinus congestion and cough for ten days.  He has been experiencing sinus congestion and drainage for approximately ten days, with the drainage going down the back of his throat, leading to a persistent cough. This cough has resulted in chest soreness. He reports feeling congested. No fever or chills are present.  He mentions experiencing shortness of breath and wheezing. He has a history of asthma and COPD but has not used his albuterol  inhaler recently as he has not had breathing problems until now. He also uses a nasal spray, the name of which he could not recall during the visit.  He reports having diarrhea but no nausea or vomiting. He initially tried NyQuil to manage his symptoms, but it did not alleviate the cough or help him sleep.  He visited his primary care doctor the day before this visit and plans to return , as he has not had his BP medications in three months and forgot to request refills at his apt. Reviewed he can call office for refills. He recently retired last week.   Past Medical History:  Diagnosis Date   Arthritis 2009   likely psoriatic arthritis--inflammatory, but not symmetric, left hip and back, right elbow, both knees   Asthma    Hypertension 2014   Psoriasis 2009   Right lumbar radiculopathy 2019    Patient Active Problem List   Diagnosis Date Noted   Cocaine abuse (HCC) 01/03/2024   ETOH abuse 01/03/2024    Marijuana abuse 01/03/2024   Schizoaffective disorder, depressive type (HCC) 01/02/2024   Atypical chest pain 01/01/2024   Pure hypercholesterolemia 01/01/2024   CKD (chronic kidney disease), stage III (HCC) 01/01/2024   Schizophrenia (HCC) 01/01/2024   Anxiety and depression 01/01/2024   Prediabetes 01/01/2024   Polysubstance abuse (HCC) 01/01/2024   Needs assistance with community resources 05/13/2023   Adjustment disorder with anxious mood 05/13/2023   Encounter for medication management 05/13/2023   Insomnia 05/13/2023   Spinal stenosis of lumbar region 09/05/2022   Protrusion of lumbar intervertebral disc 03/19/2022   Hypertensive urgency 10/20/2021   Tobacco abuse 10/20/2021   Right arm numbness 10/20/2021   Elevated troponin 10/20/2021   Other bipolar disorder (HCC) 10/20/2021   Psoriasis    Right lumbar radiculopathy 12/24/2017   Essential hypertension 12/24/2012   Arthritis 12/25/2007    History reviewed. No pertinent surgical history.     Home Medications    Prior to Admission medications   Medication Sig Start Date End Date Taking? Authorizing Provider  albuterol  (VENTOLIN  HFA) 108 (90 Base) MCG/ACT inhaler Inhale 1-2 puffs into the lungs every 6 (six) hours as needed for wheezing or shortness of breath. 06/18/24  Yes Johnie Flaming A, NP  amoxicillin-clavulanate (AUGMENTIN) 875-125 MG tablet Take 1 tablet by mouth every 12 (twelve) hours. 11/06/24  Yes Remmy Crass E, PA-C  predniSONE  (DELTASONE ) 20 MG tablet Take 2 tablets (  40 mg total) by mouth daily for 5 days. 11/06/24 11/11/24 Yes Owin Vignola E, PA-C  amLODipine  (NORVASC ) 10 MG tablet Take 1 tablet (10 mg total) by mouth daily. 01/08/24   Remington, Amber E, NP  aspirin  EC 81 MG tablet Take 1 tablet (81 mg total) by mouth daily. Swallow whole. 01/08/24   Remington, Amber E, NP  atorvastatin  (LIPITOR) 80 MG tablet Take 1 tablet (80 mg total) by mouth at bedtime. 01/07/24   Remington, Amber E, NP  azelastine   (ASTELIN ) 0.1 % nasal spray Place 2 sprays into both nostrils 2 (two) times daily. Use in each nostril as directed 06/18/24   Johnie Flaming A, NP  benzonatate  (TESSALON ) 100 MG capsule Take 1 capsule (100 mg total) by mouth every 8 (eight) hours. 06/18/24   Johnie Flaming A, NP  benztropine  (COGENTIN ) 1 MG tablet Take 1 tablet (1 mg total) by mouth daily as needed for tremors. 01/07/24   Remington, Amber E, NP  escitalopram  (LEXAPRO ) 10 MG tablet Take 1 tablet (10 mg total) by mouth daily. 01/08/24   Remington, Amber E, NP  haloperidol  (HALDOL ) 1 MG tablet Take 2.5 tablets (2.5 mg total) by mouth 2 (two) times daily. 01/07/24   Remington, Amber E, NP  irbesartan  (AVAPRO ) 150 MG tablet Take 1 tablet (150 mg total) by mouth daily. 01/07/24   Remington, Amber E, NP  loratadine  (CLARITIN ) 10 MG tablet Take 1 tablet (10 mg total) by mouth daily. 01/08/24   Remington, Amber E, NP  melatonin 5 MG TABS Take 1 tablet (5 mg total) by mouth at bedtime. 01/07/24   Remington, Amber E, NP  ondansetron  (ZOFRAN -ODT) 4 MG disintegrating tablet Take 1 tablet (4 mg total) by mouth every 8 (eight) hours as needed for nausea or vomiting. 02/21/24   Johnie Flaming A, NP  pantoprazole  (PROTONIX ) 40 MG tablet Take 1 tablet (40 mg total) by mouth daily. 01/07/24   Arvilla Jeoffrey BRAVO, NP    Family History Family History  Problem Relation Age of Onset   Hypertension Mother    Arthritis Mother    Gout Mother    Hyperlipidemia Mother    Gout Father    Alcohol abuse Father        probable cirrhosis   Heart disease Brother 76       AMI   HIV/AIDS Brother    Cancer Brother        not sure what primary    Social History Social History   Tobacco Use   Smoking status: Every Day    Current packs/day: 1.00    Types: Cigarettes   Smokeless tobacco: Never   Tobacco comments:    [facility]  Vaping Use   Vaping status: Never Used  Substance Use Topics   Alcohol use: Yes   Drug use: Yes     Types: Marijuana    Comment: history of speed, MJ, Acid.  Last time use was 1999     Allergies   Almond (diagnostic)   Review of Systems Review of Systems  Constitutional:  Negative for chills and fever.  HENT:  Positive for congestion, postnasal drip, rhinorrhea, sinus pressure and sinus pain. Negative for ear pain and sore throat.   Respiratory:  Positive for cough, shortness of breath and wheezing.   Gastrointestinal:  Positive for diarrhea. Negative for nausea and vomiting.  Skin:  Negative for rash.     Physical Exam Triage Vital Signs ED Triage Vitals  Encounter Vitals  Group     BP 11/06/24 1319 (!) 160/96     Girls Systolic BP Percentile --      Girls Diastolic BP Percentile --      Boys Systolic BP Percentile --      Boys Diastolic BP Percentile --      Pulse Rate 11/06/24 1319 78     Resp 11/06/24 1319 (!) 22     Temp 11/06/24 1319 98.5 F (36.9 C)     Temp Source 11/06/24 1319 Oral     SpO2 11/06/24 1319 96 %     Weight --      Height --      Head Circumference --      Peak Flow --      Pain Score 11/06/24 1320 5     Pain Loc --      Pain Education --      Exclude from Growth Chart --    No data found.  Updated Vital Signs BP (!) 160/96 (BP Location: Left Arm)   Pulse 78   Temp 98.5 F (36.9 C) (Oral)   Resp (!) 22   SpO2 96%   Visual Acuity Right Eye Distance:   Left Eye Distance:   Bilateral Distance:    Right Eye Near:   Left Eye Near:    Bilateral Near:     Physical Exam Vitals reviewed.  Constitutional:      General: He is awake.     Appearance: Normal appearance. He is well-developed and well-groomed.  HENT:     Head: Normocephalic and atraumatic.     Right Ear: Hearing, tympanic membrane and ear canal normal.     Left Ear: Hearing, tympanic membrane and ear canal normal.     Mouth/Throat:     Lips: Pink.     Mouth: Mucous membranes are moist.     Pharynx: Oropharynx is clear. Uvula midline. Posterior oropharyngeal erythema and  postnasal drip present. No pharyngeal swelling, oropharyngeal exudate or uvula swelling.     Tonsils: No tonsillar exudate or tonsillar abscesses.  Eyes:     General: Lids are normal. Gaze aligned appropriately.     Extraocular Movements: Extraocular movements intact.  Cardiovascular:     Rate and Rhythm: Normal rate and regular rhythm.     Heart sounds: Normal heart sounds.  Pulmonary:     Effort: Pulmonary effort is normal.     Breath sounds: Decreased air movement present. Examination of the right-middle field reveals decreased breath sounds. Examination of the right-lower field reveals decreased breath sounds. Decreased breath sounds present. No wheezing, rhonchi or rales.     Comments: Mild decreased air movement and decreased breath sounds particularly in the right lung fields. No wheezing or other unusual breath sounds  Musculoskeletal:     Cervical back: Normal range of motion and neck supple.  Lymphadenopathy:     Head:     Right side of head: No submental, submandibular or preauricular adenopathy.     Left side of head: No submental, submandibular or preauricular adenopathy.     Cervical:     Right cervical: No superficial cervical adenopathy.    Left cervical: No superficial cervical adenopathy.     Upper Body:     Right upper body: No supraclavicular adenopathy.     Left upper body: No supraclavicular adenopathy.  Skin:    General: Skin is warm and dry.  Neurological:     General: No focal deficit present.  Mental Status: He is alert and oriented to person, place, and time.  Psychiatric:        Mood and Affect: Mood normal.        Behavior: Behavior normal. Behavior is cooperative.        Thought Content: Thought content normal.        Judgment: Judgment normal.      UC Treatments / Results  Labs (all labs ordered are listed, but only abnormal results are displayed) Labs Reviewed - No data to display  EKG   Radiology No results  found.  Procedures Procedures (including critical care time)  Medications Ordered in UC Medications - No data to display  Initial Impression / Assessment and Plan / UC Course  I have reviewed the triage vital signs and the nursing notes.  Pertinent labs & imaging results that were available during my care of the patient were reviewed by me and considered in my medical decision making (see chart for details).      Final Clinical Impressions(s) / UC Diagnoses   Final diagnoses:  Shortness of breath  Bronchitis  Acute maxillary sinusitis, recurrence not specified    Acute sinusitis with postnasal drainage Acute sinusitis with postnasal drainage for approximately ten days, presenting with congestion, sinus pain, pressure, and cough. No fever or chills. Examination reveals redness in the throat. Given the duration of symptoms and COPD, bacterial infection is suspected. - Prescribed Augmentin 875 mg orally twice per day for 7 days.  Chronic obstructive pulmonary disease (COPD) and asthma COPD and asthma with recent exacerbation characterized by shortness of breath and wheezing. No recent use of albuterol  inhaler. Nasal spray used but name unknown. - Prescribed prednisone  for 5 days to aid breathing. - Advised use of albuterol  inhaler as needed for symptom relief.    Discharge Instructions      VISIT SUMMARY:  You came in today with sinus congestion and a cough that has lasted for ten days. You also reported shortness of breath and wheezing, which are related to your asthma and COPD. You mentioned that you have not used your albuterol  inhaler recently and have been using a nasal spray, though you could not recall its name. You also noted experiencing diarrhea but no nausea or vomiting. You recently retired and plan to visit your primary care doctor on Monday for a blood pressure check.  YOUR PLAN:  -ACUTE SINUSITIS WITH POSTNASAL DRAINAGE: Acute sinusitis is an infection of the  sinuses that causes congestion, pain, and pressure. The drainage from your sinuses has been going down the back of your throat, causing a persistent cough. You have been prescribed Augmentin 875 mg to take orally twice a day for 7 days to treat the suspected bacterial infection.  -CHRONIC OBSTRUCTIVE PULMONARY DISEASE (COPD) AND ASTHMA: COPD and asthma are chronic lung conditions that make it hard to breathe. You have experienced a recent flare-up with shortness of breath and wheezing. You have been prescribed prednisone  for 5 days to help with your breathing and advised to use your albuterol  inhaler as needed for symptom relief.  INSTRUCTIONS:  Please take the prescribed medications as directed. Follow up with your primary care doctor on Monday for your blood pressure check. If your symptoms worsen or do not improve, please seek medical attention.     ED Prescriptions     Medication Sig Dispense Auth. Provider   amoxicillin-clavulanate (AUGMENTIN) 875-125 MG tablet Take 1 tablet by mouth every 12 (twelve) hours. 14 tablet Elizabeth Paulsen E,  PA-C   predniSONE  (DELTASONE ) 20 MG tablet Take 2 tablets (40 mg total) by mouth daily for 5 days. 10 tablet Mariyah Upshaw E, PA-C      PDMP not reviewed this encounter.   Marylene Rocky BRAVO, PA-C 11/06/24 1418

## 2024-11-06 NOTE — Discharge Instructions (Addendum)
 VISIT SUMMARY:  You came in today with sinus congestion and a cough that has lasted for ten days. You also reported shortness of breath and wheezing, which are related to your asthma and COPD. You mentioned that you have not used your albuterol  inhaler recently and have been using a nasal spray, though you could not recall its name. You also noted experiencing diarrhea but no nausea or vomiting. You recently retired and plan to visit your primary care doctor on Monday for a blood pressure check.  YOUR PLAN:  -ACUTE SINUSITIS WITH POSTNASAL DRAINAGE: Acute sinusitis is an infection of the sinuses that causes congestion, pain, and pressure. The drainage from your sinuses has been going down the back of your throat, causing a persistent cough. You have been prescribed Augmentin 875 mg to take orally twice a day for 7 days to treat the suspected bacterial infection.  -CHRONIC OBSTRUCTIVE PULMONARY DISEASE (COPD) AND ASTHMA: COPD and asthma are chronic lung conditions that make it hard to breathe. You have experienced a recent flare-up with shortness of breath and wheezing. You have been prescribed prednisone  for 5 days to help with your breathing and advised to use your albuterol  inhaler as needed for symptom relief.  INSTRUCTIONS:  Please take the prescribed medications as directed. Follow up with your primary care doctor on Monday for your blood pressure check. If your symptoms worsen or do not improve, please seek medical attention.

## 2024-11-06 NOTE — ED Triage Notes (Signed)
 Pt reports nasal drainage starting 10 days ago. Over the last 3 days had SOB at rest, fatigue. Cough at times. No fever or body aches. Reports soreness in R chest reproducible with certain movements. Reports pressure in face.
